# Patient Record
Sex: Female | Born: 1945
Health system: Southern US, Community
[De-identification: ages and names within clinical notes are randomized; demographics above are authoritative.]

## PROBLEM LIST (undated history)

## (undated) DIAGNOSIS — Z720 Tobacco use: Secondary | ICD-10-CM

## (undated) DIAGNOSIS — Z923 Personal history of irradiation: Secondary | ICD-10-CM

## (undated) DIAGNOSIS — J189 Pneumonia, unspecified organism: Secondary | ICD-10-CM

## (undated) DIAGNOSIS — R911 Solitary pulmonary nodule: Secondary | ICD-10-CM

## (undated) DIAGNOSIS — J449 Chronic obstructive pulmonary disease, unspecified: Secondary | ICD-10-CM

## (undated) DIAGNOSIS — E785 Hyperlipidemia, unspecified: Secondary | ICD-10-CM

## (undated) DIAGNOSIS — R0600 Dyspnea, unspecified: Secondary | ICD-10-CM

## (undated) HISTORY — PX: TONSILLECTOMY: SUR1361

## (undated) HISTORY — PX: WRIST SURGERY: SHX841

## (undated) HISTORY — DX: Solitary pulmonary nodule: R91.1

## (undated) HISTORY — DX: Personal history of irradiation: Z92.3

## (undated) HISTORY — PX: TUBAL LIGATION: SHX77

## (undated) HISTORY — PX: OTHER SURGICAL HISTORY: SHX169

## (undated) HISTORY — PX: WISDOM TOOTH EXTRACTION: SHX21

## (undated) HISTORY — DX: Hyperlipidemia, unspecified: E78.5

## (undated) HISTORY — DX: Chronic obstructive pulmonary disease, unspecified: J44.9

## (undated) HISTORY — DX: Tobacco use: Z72.0

---

## 2005-09-23 ENCOUNTER — Encounter: Payer: Self-pay | Admitting: Pulmonary Disease

## 2005-10-09 ENCOUNTER — Encounter: Payer: Self-pay | Admitting: Pulmonary Disease

## 2005-12-04 ENCOUNTER — Ambulatory Visit: Payer: Self-pay | Admitting: Internal Medicine

## 2005-12-29 ENCOUNTER — Encounter: Payer: Self-pay | Admitting: Pulmonary Disease

## 2006-03-08 ENCOUNTER — Ambulatory Visit: Payer: Self-pay | Admitting: Internal Medicine

## 2006-04-19 ENCOUNTER — Ambulatory Visit: Payer: Self-pay | Admitting: Internal Medicine

## 2006-07-27 LAB — CONVERTED CEMR LAB: Pap Smear: NORMAL

## 2006-10-25 ENCOUNTER — Encounter: Payer: Self-pay | Admitting: Pulmonary Disease

## 2006-11-29 ENCOUNTER — Encounter: Payer: Self-pay | Admitting: Pulmonary Disease

## 2007-04-07 ENCOUNTER — Encounter: Payer: Self-pay | Admitting: Pulmonary Disease

## 2007-08-02 ENCOUNTER — Ambulatory Visit: Payer: Self-pay | Admitting: Internal Medicine

## 2007-08-12 ENCOUNTER — Encounter: Payer: Self-pay | Admitting: Pulmonary Disease

## 2007-11-17 ENCOUNTER — Ambulatory Visit: Payer: Self-pay | Admitting: Internal Medicine

## 2007-11-17 DIAGNOSIS — J449 Chronic obstructive pulmonary disease, unspecified: Secondary | ICD-10-CM

## 2007-11-17 DIAGNOSIS — J4489 Other specified chronic obstructive pulmonary disease: Secondary | ICD-10-CM

## 2007-11-17 HISTORY — DX: Chronic obstructive pulmonary disease, unspecified: J44.9

## 2007-11-17 HISTORY — DX: Other specified chronic obstructive pulmonary disease: J44.89

## 2007-11-25 ENCOUNTER — Telehealth (INDEPENDENT_AMBULATORY_CARE_PROVIDER_SITE_OTHER): Payer: Self-pay | Admitting: *Deleted

## 2007-11-25 LAB — CONVERTED CEMR LAB
ALT: 20 units/L (ref 0–35)
AST: 22 units/L (ref 0–37)
Albumin: 4 g/dL (ref 3.5–5.2)
Alkaline Phosphatase: 79 units/L (ref 39–117)
BUN: 8 mg/dL (ref 6–23)
Basophils Absolute: 0 10*3/uL (ref 0.0–0.1)
Basophils Relative: 0 % (ref 0.0–1.0)
Bilirubin, Direct: 0.1 mg/dL (ref 0.0–0.3)
CO2: 31 meq/L (ref 19–32)
Calcium: 9.1 mg/dL (ref 8.4–10.5)
Chloride: 105 meq/L (ref 96–112)
Cholesterol: 191 mg/dL (ref 0–200)
Creatinine, Ser: 0.7 mg/dL (ref 0.4–1.2)
Eosinophils Absolute: 0.1 10*3/uL (ref 0.0–0.7)
Eosinophils Relative: 1.6 % (ref 0.0–5.0)
GFR calc Af Amer: 110 mL/min
GFR calc non Af Amer: 91 mL/min
Glucose, Bld: 79 mg/dL (ref 70–99)
HCT: 43.1 % (ref 36.0–46.0)
HDL: 52 mg/dL (ref >39.0)
Hemoglobin: 14.5 g/dL (ref 12.0–15.0)
LDL Cholesterol: 124 mg/dL — ABNORMAL HIGH (ref 0–99)
Lymphocytes Relative: 39.7 % (ref 12.0–46.0)
MCHC: 33.5 g/dL (ref 30.0–36.0)
MCV: 94 fL (ref 78.0–100.0)
Monocytes Absolute: 0.4 10*3/uL (ref 0.1–1.0)
Monocytes Relative: 7.2 % (ref 3.0–12.0)
Neutro Abs: 3.2 10*3/uL (ref 1.4–7.7)
Neutrophils Relative %: 51.5 % (ref 43.0–77.0)
Platelets: 239 10*3/uL (ref 150–400)
Potassium: 3.6 meq/L (ref 3.5–5.1)
RBC: 4.59 M/uL (ref 3.87–5.11)
RDW: 12.5 % (ref 11.5–14.6)
Sodium: 141 meq/L (ref 135–145)
TSH: 1.23 microintl units/mL (ref 0.35–5.50)
Total Bilirubin: 0.7 mg/dL (ref 0.3–1.2)
Total CHOL/HDL Ratio: 3.7
Total Protein: 7.1 g/dL (ref 6.0–8.3)
Triglycerides: 77 mg/dL (ref 0–149)
VLDL: 15 mg/dL (ref 0–40)
WBC: 6.1 10*3/uL (ref 4.5–10.5)

## 2007-12-08 ENCOUNTER — Encounter: Payer: Self-pay | Admitting: Pulmonary Disease

## 2007-12-08 ENCOUNTER — Encounter: Payer: Self-pay | Admitting: Internal Medicine

## 2008-01-16 LAB — CONVERTED CEMR LAB: Pap Smear: NORMAL

## 2008-01-16 LAB — HM COLONOSCOPY: HM Colonoscopy: 3

## 2008-03-22 ENCOUNTER — Encounter: Payer: Self-pay | Admitting: Internal Medicine

## 2008-04-16 ENCOUNTER — Encounter: Payer: Self-pay | Admitting: Pulmonary Disease

## 2008-05-15 ENCOUNTER — Telehealth: Payer: Self-pay | Admitting: Internal Medicine

## 2008-05-25 ENCOUNTER — Encounter: Payer: Self-pay | Admitting: Internal Medicine

## 2008-06-04 ENCOUNTER — Encounter: Payer: Self-pay | Admitting: Pulmonary Disease

## 2008-07-23 ENCOUNTER — Ambulatory Visit: Payer: Self-pay | Admitting: Internal Medicine

## 2008-08-07 ENCOUNTER — Telehealth (INDEPENDENT_AMBULATORY_CARE_PROVIDER_SITE_OTHER): Payer: Self-pay | Admitting: *Deleted

## 2008-08-13 ENCOUNTER — Telehealth (INDEPENDENT_AMBULATORY_CARE_PROVIDER_SITE_OTHER): Payer: Self-pay | Admitting: *Deleted

## 2008-08-15 ENCOUNTER — Ambulatory Visit: Payer: Self-pay | Admitting: Internal Medicine

## 2008-10-01 ENCOUNTER — Encounter: Payer: Self-pay | Admitting: Pulmonary Disease

## 2008-10-17 ENCOUNTER — Ambulatory Visit: Payer: Self-pay | Admitting: Internal Medicine

## 2008-11-14 ENCOUNTER — Encounter: Payer: Self-pay | Admitting: Internal Medicine

## 2008-11-14 LAB — HM DEXA SCAN

## 2008-11-19 ENCOUNTER — Ambulatory Visit: Payer: Self-pay | Admitting: Internal Medicine

## 2008-11-19 LAB — CONVERTED CEMR LAB
ALT: 18 units/L (ref 0–35)
AST: 18 units/L (ref 0–37)
Albumin: 3.8 g/dL (ref 3.5–5.2)
Alkaline Phosphatase: 91 units/L (ref 39–117)
BUN: 10 mg/dL (ref 6–23)
Basophils Absolute: 0 10*3/uL (ref 0.0–0.1)
Basophils Relative: 0.3 % (ref 0.0–3.0)
Bilirubin, Direct: 0.1 mg/dL (ref 0.0–0.3)
CO2: 30 meq/L (ref 19–32)
Calcium: 9 mg/dL (ref 8.4–10.5)
Chloride: 107 meq/L (ref 96–112)
Cholesterol: 247 mg/dL — ABNORMAL HIGH (ref 0–200)
Creatinine, Ser: 0.7 mg/dL (ref 0.4–1.2)
Direct LDL: 198.1 mg/dL
Eosinophils Absolute: 0.1 10*3/uL (ref 0.0–0.7)
Eosinophils Relative: 1.6 % (ref 0.0–5.0)
GFR calc non Af Amer: 90.6 mL/min (ref >60)
Glucose, Bld: 91 mg/dL (ref 70–99)
HCT: 45.4 % (ref 36.0–46.0)
HDL: 53.5 mg/dL (ref >39.00)
Hemoglobin: 15.6 g/dL — ABNORMAL HIGH (ref 12.0–15.0)
Lymphocytes Relative: 29.3 % (ref 12.0–46.0)
Lymphs Abs: 1.5 10*3/uL (ref 0.7–4.0)
MCHC: 34.3 g/dL (ref 30.0–36.0)
MCV: 92.8 fL (ref 78.0–100.0)
Monocytes Absolute: 0.4 10*3/uL (ref 0.1–1.0)
Monocytes Relative: 7.5 % (ref 3.0–12.0)
Neutro Abs: 3.1 10*3/uL (ref 1.4–7.7)
Neutrophils Relative %: 61.3 % (ref 43.0–77.0)
Platelets: 211 10*3/uL (ref 150.0–400.0)
Potassium: 3.9 meq/L (ref 3.5–5.1)
RBC: 4.89 M/uL (ref 3.87–5.11)
RDW: 12.2 % (ref 11.5–14.6)
Sodium: 141 meq/L (ref 135–145)
TSH: 1.25 microintl units/mL (ref 0.35–5.50)
Total Bilirubin: 0.6 mg/dL (ref 0.3–1.2)
Total CHOL/HDL Ratio: 5
Total Protein: 7.1 g/dL (ref 6.0–8.3)
Triglycerides: 115 mg/dL (ref 0.0–149.0)
VLDL: 23 mg/dL (ref 0.0–40.0)
WBC: 5.1 10*3/uL (ref 4.5–10.5)

## 2008-11-20 ENCOUNTER — Encounter: Payer: Self-pay | Admitting: Pulmonary Disease

## 2008-11-20 ENCOUNTER — Encounter: Payer: Self-pay | Admitting: Internal Medicine

## 2008-11-20 LAB — CONVERTED CEMR LAB: Vit D, 1,25-Dihydroxy: 26 — ABNORMAL LOW (ref 30–89)

## 2008-11-21 ENCOUNTER — Telehealth: Payer: Self-pay | Admitting: Internal Medicine

## 2009-01-22 LAB — CONVERTED CEMR LAB: Pap Smear: NORMAL

## 2009-02-21 ENCOUNTER — Ambulatory Visit: Payer: Self-pay | Admitting: Internal Medicine

## 2009-03-28 ENCOUNTER — Telehealth: Payer: Self-pay | Admitting: Internal Medicine

## 2009-05-14 ENCOUNTER — Telehealth: Payer: Self-pay | Admitting: Internal Medicine

## 2009-05-15 ENCOUNTER — Telehealth: Payer: Self-pay | Admitting: Internal Medicine

## 2009-06-05 ENCOUNTER — Ambulatory Visit: Payer: Self-pay | Admitting: Internal Medicine

## 2009-06-05 DIAGNOSIS — R222 Localized swelling, mass and lump, trunk: Secondary | ICD-10-CM

## 2009-07-23 ENCOUNTER — Telehealth: Payer: Self-pay | Admitting: Internal Medicine

## 2009-10-07 ENCOUNTER — Telehealth: Payer: Self-pay | Admitting: Internal Medicine

## 2009-10-08 ENCOUNTER — Ambulatory Visit (HOSPITAL_BASED_OUTPATIENT_CLINIC_OR_DEPARTMENT_OTHER): Admission: RE | Admit: 2009-10-08 | Discharge: 2009-10-08 | Payer: Self-pay | Admitting: Internal Medicine

## 2009-10-08 ENCOUNTER — Ambulatory Visit: Payer: Self-pay | Admitting: Diagnostic Radiology

## 2009-10-08 ENCOUNTER — Ambulatory Visit: Payer: Self-pay | Admitting: Internal Medicine

## 2009-10-15 ENCOUNTER — Telehealth: Payer: Self-pay | Admitting: Internal Medicine

## 2009-10-17 ENCOUNTER — Ambulatory Visit (HOSPITAL_BASED_OUTPATIENT_CLINIC_OR_DEPARTMENT_OTHER): Admission: RE | Admit: 2009-10-17 | Discharge: 2009-10-17 | Payer: Self-pay | Admitting: Internal Medicine

## 2009-10-17 ENCOUNTER — Ambulatory Visit: Payer: Self-pay | Admitting: Diagnostic Radiology

## 2009-10-17 ENCOUNTER — Telehealth: Payer: Self-pay | Admitting: Internal Medicine

## 2009-10-28 ENCOUNTER — Telehealth: Payer: Self-pay | Admitting: Internal Medicine

## 2009-10-29 ENCOUNTER — Ambulatory Visit (HOSPITAL_COMMUNITY): Admission: RE | Admit: 2009-10-29 | Discharge: 2009-10-29 | Payer: Self-pay | Admitting: Internal Medicine

## 2009-10-31 ENCOUNTER — Telehealth: Payer: Self-pay | Admitting: Internal Medicine

## 2009-11-05 ENCOUNTER — Telehealth: Payer: Self-pay | Admitting: Internal Medicine

## 2009-11-05 ENCOUNTER — Encounter: Payer: Self-pay | Admitting: Internal Medicine

## 2009-11-05 LAB — CONVERTED CEMR LAB: BUN: 10 mg/dL (ref 6–23)

## 2009-11-07 ENCOUNTER — Telehealth: Payer: Self-pay | Admitting: Internal Medicine

## 2009-11-07 ENCOUNTER — Encounter: Admission: RE | Admit: 2009-11-07 | Discharge: 2009-11-07 | Payer: Self-pay | Admitting: Internal Medicine

## 2009-11-18 ENCOUNTER — Telehealth: Payer: Self-pay | Admitting: Internal Medicine

## 2009-11-19 ENCOUNTER — Encounter: Payer: Self-pay | Admitting: Internal Medicine

## 2009-12-03 ENCOUNTER — Telehealth: Payer: Self-pay | Admitting: Internal Medicine

## 2010-01-13 ENCOUNTER — Ambulatory Visit: Payer: Self-pay | Admitting: Diagnostic Radiology

## 2010-01-13 ENCOUNTER — Ambulatory Visit (HOSPITAL_BASED_OUTPATIENT_CLINIC_OR_DEPARTMENT_OTHER): Admission: RE | Admit: 2010-01-13 | Discharge: 2010-01-13 | Payer: Self-pay | Admitting: Obstetrics and Gynecology

## 2010-01-14 ENCOUNTER — Ambulatory Visit: Payer: Self-pay | Admitting: Pulmonary Disease

## 2010-03-03 ENCOUNTER — Ambulatory Visit: Payer: Self-pay | Admitting: Radiology

## 2010-03-03 ENCOUNTER — Ambulatory Visit (HOSPITAL_BASED_OUTPATIENT_CLINIC_OR_DEPARTMENT_OTHER)
Admission: RE | Admit: 2010-03-03 | Discharge: 2010-03-03 | Payer: Self-pay | Source: Home / Self Care | Admitting: Orthopedic Surgery

## 2010-05-09 ENCOUNTER — Ambulatory Visit: Payer: Self-pay | Admitting: Internal Medicine

## 2010-05-28 ENCOUNTER — Telehealth: Payer: Self-pay | Admitting: Internal Medicine

## 2010-06-11 ENCOUNTER — Ambulatory Visit: Payer: Self-pay | Admitting: Internal Medicine

## 2010-07-01 ENCOUNTER — Telehealth: Payer: Self-pay | Admitting: Internal Medicine

## 2010-07-03 ENCOUNTER — Ambulatory Visit: Payer: Self-pay | Admitting: Internal Medicine

## 2010-07-04 ENCOUNTER — Ambulatory Visit: Payer: Self-pay | Admitting: Family

## 2010-07-04 DIAGNOSIS — J329 Chronic sinusitis, unspecified: Secondary | ICD-10-CM | POA: Insufficient documentation

## 2010-08-19 ENCOUNTER — Telehealth: Payer: Self-pay | Admitting: Family

## 2010-08-19 ENCOUNTER — Ambulatory Visit
Admission: RE | Admit: 2010-08-19 | Discharge: 2010-08-19 | Payer: Self-pay | Source: Home / Self Care | Attending: Family | Admitting: Family

## 2010-08-19 ENCOUNTER — Ambulatory Visit (HOSPITAL_BASED_OUTPATIENT_CLINIC_OR_DEPARTMENT_OTHER)
Admission: RE | Admit: 2010-08-19 | Discharge: 2010-08-19 | Payer: Self-pay | Source: Home / Self Care | Attending: Internal Medicine | Admitting: Internal Medicine

## 2010-08-26 NOTE — Progress Notes (Signed)
Summary: 4.27.11 visit  Phone Note Call from Patient   Caller: Patient Call For: D. Thomos Lemons DO Complaint: Breathing Problems Summary of Call: Pt wants to know if you definitely need to see her for her follow up visit on Wed 11/20/09, she would like to cancel if the appt is not necessary, pls advise Initial call taken by: Lannette Donath,  November 18, 2009 9:27 AM  Follow-up for Phone Call        ok to cancel appt Follow-up by: D. Thomos Lemons DO,  November 18, 2009 1:37 PM  Additional Follow-up for Phone Call Additional follow up Details #1::        LMOM ok to cancel appt Diane Tomerlin  November 18, 2009 2:17 PM

## 2010-08-26 NOTE — Progress Notes (Signed)
Summary: Status Update  Phone Note Call from Patient Call back at Home Phone [phone]   Caller: Patient Call For: D. Thomos Lemons DO Summary of Call: patient called and left voice message stating she still has nasal drip, cough, and sore throat. She would like to know if Dr Artist Pais would prescribe a stronger antibiotic Initial call taken by: Glendell Docker CMA,  July 01, 2010 10:16 AM  Follow-up for Phone Call        we can not rx abx w/o examining pt  Follow-up by: D. Thomos Lemons DO,  July 01, 2010 1:23 PM  Additional Follow-up for Phone Call Additional follow up Details #1::        call returned to patient at [phone], she has been advised per Dr Artist Pais instructions. Appt scheduled for Thursday 12/08/ 2011 @ 11am  with Dr Artist Pais. Patient states she will call back if and cancel appointment if she is feeling better Additional Follow-up by: Glendell Docker CMA,  July 01, 2010 1:58 PM

## 2010-08-26 NOTE — Progress Notes (Signed)
Summary: Advair Refill  Phone Note Call from Patient Call back at Home Phone [phone]   Caller: Patient Call For: D. Thomos Lemons DO Reason for Call: Talk to Nurse Summary of Call: Pt is about to run out of Adver sample wants to know if she should get a refill Initial call taken by: Lannette Donath,  October 28, 2009 10:53 AM    Prescriptions: ADVAIR DISKUS 250-50 MCG/DOSE AEPB (FLUTICASONE-SALMETEROL) 1 dose two times a day  #1 x 0   Entered by:   Glendell Docker CMA   Authorized by:   D. Thomos Lemons DO   Signed by:   Glendell Docker CMA on 10/28/2009   Method used:   Electronically to        [facility] # [phone]* (retail)       [address]       [place], [place]  [address]       Ph: [phone]       Fax: [phone]   RxID:   [id number]

## 2010-08-26 NOTE — Progress Notes (Signed)
Summary: COUGH MEDICINE   Phone Note Call from Patient Call back at Work Phone [phone]   Caller: PT LIVE Call For: [facility]  Summary of Call: Karsten Ro MAIN [phone] WOULD LIKE RX FOR COUGH MEDICINE  Initial call taken by: Roselle Locus,  October 07, 2009 9:55 AM  Follow-up for Phone Call        call returned to patient at [phone] no answer, voice message left informing patient she will need to schedule office visit for assessment of her cough. Rx for cough was denied, office visit is needed Follow-up by: Glendell Docker CMA,  October 07, 2009 2:19 PM

## 2010-08-26 NOTE — Progress Notes (Signed)
Summary: Records rec from Dr Eulis Foster [facility]  Phone Note From Other Clinic   Caller: [facility] Call For: Artist Pais  Summary of Call: records received from Dr Eulis Foster at [facility] Initial call taken by: Roselle Locus,  Dec 03, 2009 8:27 AM

## 2010-08-26 NOTE — Progress Notes (Signed)
Summary: Cough medicine  Phone Note Call from Patient Call back at Home Phone [phone]   Caller: Patient Call For: D. Thomos Lemons DO Summary of Call: patient called and left voice message request a refill on cough syrup. She states she has developed a cough and would like her rx refilled. Initial call taken by: Glendell Docker CMA,  May 28, 2010 11:01 AM  Follow-up for Phone Call        call was returned to patient at [phone], patient states her symptoms started Friday with nasal congestion, dry cough, that is worse when laying down, she denies fever.  She states she has been taking Tylenol sinus with no improvement Follow-up by: Glendell Docker CMA,  May 28, 2010 11:04 AM  Additional Follow-up for Phone Call Additional follow up Details #1::        try otc mucinex.   OV if cough gets worse Additional Follow-up by: D. Thomos Lemons DO,  May 28, 2010 12:01 PM    Additional Follow-up for Phone Call Additional follow up Details #2::    call returned to patient at [phone], she has been advised per Dr Artist Pais instructions Follow-up by: Glendell Docker CMA,  May 28, 2010 1:34 PM

## 2010-08-26 NOTE — Progress Notes (Signed)
  Phone Note Call from Patient   Caller: Patient Details for Reason: Referral to Pulmonary Summary of Call: Pt would like to start see Pulmonary in [facility],  she had her records from  her Pulmonalist sent here  . If this is ok please send referral   Initial call taken by: Darral Dash,  Dec 03, 2009 12:05 PM  Follow-up for Phone Call        Referral order rec'd  appt  Dr Vassie Loll June 7th   pt aware Follow-up by: Darral Dash,  Dec 05, 2009 8:52 AM

## 2010-08-26 NOTE — Miscellaneous (Signed)
Summary: TB/[facility]  [facility]   Imported By: Sherian Rein 02/04/2010 09:34:21  _____________________________________________________________________  External Attachment:    Type:   Image     Comment:   External Document

## 2010-08-26 NOTE — Progress Notes (Signed)
Summary: whole body scan results   Phone Note Call from Patient Call back at Home Phone [phone]   Caller: patient  Call For: yoo  Summary of Call: would like reults of whole body scan  please call before 2  Initial call taken by: Roselle Locus,  October 31, 2009 9:07 AM  Follow-up for Phone Call        Spoke to pt and advised her that Dr. Artist Pais will need to review and interpret her results. He is out of the office until Monday but we will call her back then.  Pt. requests that we call her before 2pm as she will be at work then.  Mervin Kung CMA  October 31, 2009 11:19 AM   Additional Follow-up for Phone Call Additional follow up Details #1::        bone scan shows increased uptake in shoulder.   radiologist is recommending MRI of shoulder Additional Follow-up by: D. Thomos Lemons DO,  November 04, 2009 2:21 PM    Additional Follow-up for Phone Call Additional follow up Details #2::     she has been advised per Dr Artist Pais instructions, she states that she has already been informed of her results .patient states she is scared to death of a closed MRI,  and would like to know if there is a way she could have an "open", she is requesting the phone number to the radiology facility to check to see if it is covered and will need to know the cost prior to having the test done. She wouldl like to receive a call back today prior to 2pm. Patient was informed that I would relay the information, however I could not guarantee that she would receive return phone call before 2 pm Follow-up by: Glendell Docker CMA,  November 05, 2009 9:56 AM  Additional Follow-up for Phone Call Additional follow up Details #3:: Details for Additional Follow-up Action Taken: patient was advised rx faxed to pharmacy Additional Follow-up by: Glendell Docker CMA,  November 05, 2009 4:11 PM  New/Updated Medications: ALPRAZOLAM 0.25 MG TABS (ALPRAZOLAM) take 1 tab 30 mins before MRI scan Prescriptions: ALPRAZOLAM 0.25 MG TABS  (ALPRAZOLAM) take 1 tab 30 mins before MRI scan  #5 x 0   Entered and Authorized by:   D. Thomos Lemons DO   Signed by:   D. Thomos Lemons DO on 11/05/2009   Method used:   Print then Give to Patient   RxID:   [id number]

## 2010-08-26 NOTE — Progress Notes (Signed)
  Phone Note Outgoing Call   Summary of Call: call pt - xray of shoulder confirms bony abnormality.   bone scan is suggested to further evaluate Initial call taken by: D. Thomos Lemons DO,  October 17, 2009 1:08 PM  Follow-up for Phone Call        Pt wants more information  about x ray   and before she will schedule bone scan    phone #  [phone]    Follow-up by: Darral Dash,  October 17, 2009 3:25 PM  Additional Follow-up for Phone Call Additional follow up Details #1::        reviewed x ray results with pt.  she is having financial issues and she thinks scan may not be covered.   I explained why bone scan was suggested by radiologist.  she expresses understanding and will proceed with scheduling exam Additional Follow-up by: D. Thomos Lemons DO,  October 17, 2009 5:34 PM    Additional Follow-up for Phone Call Additional follow up Details #2::    Patient scheduled to bone scan  April 5  @  [facility]    spoke with patient appt confirmed   Follow-up by: Darral Dash,  October 18, 2009 8:10 AM

## 2010-08-26 NOTE — Assessment & Plan Note (Signed)
Summary: cough- Jr   Vital Signs:  Patient profile:   65 year old female Height:      65 inches Weight:      112 pounds BMI:     18.71 O2 Sat:      97 % on Room air Temp:     97.4 degrees F oral Pulse rate:   78 / minute Pulse rhythm:   regular Resp:     18 per minute BP sitting:   90 / 60  (left arm) Cuff size:   regular  Vitals Entered By: Glendell Docker CMA (October 08, 2009 11:01 AM)  O2 Flow:  Room air CC: Rm 3- Cough   Primary Care Provider:  Dondra Spry DO  CC:  Rm 3- Cough.  History of Present Illness: 65 y/o female c/o productive cough  cough started - 1 week ago. nose is stuffy.  sputum - yellowish  no fever or shortness of breath  she is followed by pulm in [place] but she does not want to go back. she feels guilty about inability to quit smoking  Preventive Screening-Counseling & Management  Alcohol-Tobacco     Smoking Status: current  Allergies (verified): No Known Drug Allergies  Past History:  Past Medical History: COPD - Dr.  Gerome Apley ( [place] pulmonologist) Tobacco use     Past Surgical History: Tubal ligation      Family History: colon Ca - F breast Ca - no CAD - no lymphoma - brother HTN - siste (twin) DM - sister (twin) stroke - no     Social History: moved from [place] 4-07 Married  2 kids husband out of work x 6-7 months   Current Smoker - approx 1 ppd  Physical Exam  General:  alert, well-developed, and well-nourished.   Lungs:  decreases breath sounds right mid lung feel.   normal respiratory effort and no wheezes.   Heart:  normal rate, regular rhythm, and no gallop.   Extremities:  No lower extremity edema Neurologic:  cranial nerves II-XII intact and gait normal.   Psych:  normally interactive and good eye contact.     Impression & Recommendations:  Problem # 1:  COPD (ICD-496) Pt with COPD exac.  productive cough and mild sob.  tx with ceftin and prednisone taper. she has ongoing tob abuse.  She is followed by Dr.  Gerome Apley in [place] but does not want to go back for f/u she tried symbicort but stopped because it triggered cough  she has tried to quit smoking but unable.   obtain copy of prev PFTs. we discussed using patches.  Orders: T-2 View CXR, Same Day (71020.5TC)  Her updated medication list for this problem includes:    Advair Diskus 250-50 Mcg/dose Aepb (Fluticasone-salmeterol) .Marland Kitchen... 1 dose two times a day  Complete Medication List: 1)  Alendronate Sodium 70 Mg Tabs (Alendronate sodium) .... One by mouth q weekly 2)  Vitamin D 1000 Unit Tabs (Cholecalciferol) .... Take 1 tablet by mouth once a day 3)  Citracal/vitamin D 250-200 Mg-unit Tabs (Calcium citrate-vitamin d) .... 2 tablets by mouth once daily 4)  Cefuroxime Axetil 500 Mg Tabs (Cefuroxime axetil) .... One by mouth two times a day 5)  Advair Diskus 250-50 Mcg/dose Aepb (Fluticasone-salmeterol) .Marland Kitchen.. 1 dose two times a day 6)  Prednisone 10 Mg Tabs (Prednisone) .... 3 tabs by mouth once daily x 3 days, 2 tabs by mouth once daily x 3 days, 1 tab by mouth once daily x 3  days 7)  Hydrocodone-homatropine 5-1.5 Mg/1ml Syrp (Hydrocodone-homatropine) .... 5 ml by mouth qhs  Patient Instructions: 1)  Please schedule a follow-up appointment in 1 month. Prescriptions: PREDNISONE 10 MG TABS (PREDNISONE) 3 tabs by mouth once daily x 3 days, 2 tabs by mouth once daily x 3 days, 1 tab by mouth once daily x 3 days  #18 x 0   Entered and Authorized by:   D. Thomos Lemons DO   Signed by:   D. Thomos Lemons DO on 10/08/2009   Method used:   Print then Give to Patient   RxID:   [id number] CEFUROXIME AXETIL 500 MG TABS (CEFUROXIME AXETIL) one by mouth two times a day  #20 x 0   Entered and Authorized by:   D. Thomos Lemons DO   Signed by:   D. Thomos Lemons DO on 10/08/2009   Method used:   Print then Give to Patient   RxID:   [id number] HYDROCODONE-HOMATROPINE 5-1.5 MG/5ML SYRP (HYDROCODONE-HOMATROPINE) 5 ml by mouth qhs  #60 ml x 0   Entered and  Authorized by:   D. Thomos Lemons DO   Signed by:   D. Thomos Lemons DO on 10/08/2009   Method used:   Print then Give to Patient   RxID:   [id number] PREDNISONE 10 MG TABS (PREDNISONE) 3 tabs by mouth once daily x 3 days, 2 tabs by mouth once daily x 3 days, 1 tab by mouth once daily x 3 days  #18 x 0   Entered and Authorized by:   D. Thomos Lemons DO   Signed by:   D. Thomos Lemons DO on 10/08/2009   Method used:   Electronically to        [facility] # [phone]* (retail)       [address]       [place], [place]  [address]       Ph: [phone]       Fax: [phone]   RxID:   [id number] CEFUROXIME AXETIL 500 MG TABS (CEFUROXIME AXETIL) one by mouth two times a day  #20 x 0   Entered and Authorized by:   D. Thomos Lemons DO   Signed by:   D. Thomos Lemons DO on 10/08/2009   Method used:   Electronically to        [facility] # [phone]* (retail)       [address]       [place], [place]  [address]       Ph: [phone]       Fax: [phone]   RxID:   [id number]   Current Allergies (reviewed today): No known allergies

## 2010-08-26 NOTE — Assessment & Plan Note (Signed)
Summary: copd/apc   Visit Type:  Initial Consult Primary Provider/Referring Provider:  Dondra Spry DO  CC:  Pt here for pulmonary consult. Pt c/o.  History of Present Illness: 63/F , smoker for management of COPD. She has smoked about 45 Pyrs , about a PPD .She was able to quit x 10 wks in 2007 using patches. Wellbutrin has not helped & she is afraid of mental effects of chantix. Review of lung fucntion shows a gradual decline with FEV1 1.8 (75%) in feb 2007  to 1.4 (51% ) in march '10. She reports dyspnea on walking up stairs but can walk a good distance on level ground. she reports 1-2 episodes of bronchitis in a year, each lasting 3-4 wks & requiring oral steroids, last in march 2011. She has received pneumovax.   Preventive Screening-Counseling & Management  Alcohol-Tobacco     Smoking Status: current     Packs/Day: 1.0     Year Started: 1965  Current Medications (verified): 1)  Alendronate Sodium 70 Mg Tabs (Alendronate Sodium) .... One By Mouth Q Weekly 2)  Vitamin D 1000 Unit Tabs (Cholecalciferol) .... Take 1 Tablet By Mouth Once A Day 3)  Citracal/vitamin D 250-200 Mg-Unit Tabs (Calcium Citrate-Vitamin D) .... 2 Tablets By Mouth Once Daily  Allergies (verified): 1)  ! Symbicort (Budesonide-Formoterol Fumarate)  Past History:  Past Medical History: Last updated: 10/08/2009 COPD - Dr.  Gerome Apley ( [place] pulmonologist) Tobacco use     Past Surgical History: Last updated: 10/08/2009 Tubal ligation      Family History: Last updated: 10/08/2009 colon Ca - F breast Ca - no CAD - no lymphoma - brother HTN - siste (twin) DM - sister (twin) stroke - no     Social History: Last updated: 10/08/2009 moved from [place] 4-07 Married  2 kids husband out of work x 6-7 months   Current Smoker - approx 1 ppd  Risk Factors: Smoking Status: current (01/14/2010) Packs/Day: 1.0 (01/14/2010)  Review of Systems       The patient complains of shortness of breath with  activity.  The patient denies shortness of breath at rest, productive cough, non-productive cough, coughing up blood, chest pain, irregular heartbeats, acid heartburn, indigestion, loss of appetite, weight change, abdominal pain, difficulty swallowing, sore throat, tooth/dental problems, headaches, nasal congestion/difficulty breathing through nose, sneezing, itching, ear ache, anxiety, depression, hand/feet swelling, joint stiffness or pain, rash, change in color of mucus, and fever.    Vital Signs:  Patient profile:   65 year old female Height:      65 inches Weight:      111 pounds BMI:     18.54 O2 Sat:      96 % on Room air Temp:     98.2 degrees F oral Pulse rate:   66 / minute BP sitting:   90 / 62  (left arm) Cuff size:   regular  Vitals Entered By: Zackery Barefoot CMA (January 14, 2010 8:49 AM)  O2 Flow:  Room air CC: Pt here for pulmonary consult. Pt c/o Comments Medications reviewed with patient Verified contact number and pharmacy with patient Zackery Barefoot CMA  January 14, 2010 8:49 AM    Physical Exam  Additional Exam:  Gen. Pleasant, well-nourished, in no distress, normal affect ENT - no lesions, no post nasal drip Neck: No JVD, no thyromegaly, no carotid bruits Lungs: no use of accessory muscles, no dullness to percussion, decreased BL  without rales or rhonchi  Cardiovascular: Rhythm regular,  heart sounds  normal, no murmurs or gallops, no peripheral edema Abdomen: soft and non-tender, no hepatosplenomegaly, BS normal. Musculoskeletal: No deformities, no cyanosis or clubbing Neuro:  alert, non focal     Impression & Recommendations:  Problem # 1:  COPD (ICD-496) - moderate with clear decline in lung function related to smoking. Try to hold off bronchodilators here Use albuterol mDI as needed   Problem # 2:  TOBACCO USER (ICD-305.1)  Need to focus our effort shere. She does not want to try wellbutrin again or chantix. Will try nicotine supplements & make  another quit attempt. Her updated medication list for this problem includes:    Nicotrol 10 Mg Inha (Nicotine) .Marland Kitchen..Marland Kitchen Two times a day  Orders: Consultation Level III ([address]) Prescription Created Electronically [phone])  Medications Added to Medication List This Visit: 1)  Nicotrol 10 Mg Inha (Nicotine) .... Two times a day  Patient Instructions: 1)  Copy sent to: Dr Artist Pais 2)  Please schedule a follow-up appointment in 6 months with TP. 3)  Nicotine inhaler or gum for breakthrough on 21mg / day patch Prescriptions: NICOTROL 10 MG INHA (NICOTINE) two times a day  #20 x 1   Entered and Authorized by:   Comer Locket Vassie Loll MD   Signed by:   Comer Locket Vassie Loll MD on 01/14/2010   Method used:   Electronically to        [facility] # [phone]* (retail)       [address]       [place], [place]  [address]       Ph: [phone]       Fax: [phone]   RxID:   [id number]   Appended Document: copd/apc reviewed records from [facility] >>PPD neg, last CT chest 9/08 showed reduced RUl pleural nodule, PET negative in 2/07

## 2010-08-26 NOTE — Assessment & Plan Note (Signed)
Summary: Sx not any better, cough and nasal drip/hea--Rm 5   Vital Signs:  Patient profile:   65 year old female Height:      65 inches Weight:      110.50 pounds BMI:     18.45 O2 Sat:      98 % on Room air Temp:     97.8 degrees F oral Pulse rate:   78 / minute Pulse rhythm:   regular Resp:     16 per minute BP sitting:   110 / 80  (right arm) Cuff size:   regular  Vitals Entered By: Mervin Kung CMA Duncan Dull) (July 04, 2010 10:58 AM)  O2 Flow:  Room air CC: Pt states she still has post nasal drip and night time cough. Still has head congestion. Has completed antibiotic. Is Patient Diabetic? No Pain Assessment Patient in pain? no      Comments Pt has completed Cefuroxime and Hydrocodone. [name] CMA Duncan Dull)  July 04, 2010 11:04 AM    Primary Care Provider:  Dondra Spry DO  CC:  Pt states she still has post nasal drip and night time cough. Still has head congestion. Has completed antibiotic.Marland Kitchen  History of Present Illness: Denies sinus pressure.  + post nasal drip- clear nasal discharge.  Denies fever.  Antiobiotics did not help her symptoms.  Occasional cough during the day, worse at night.  When she lays flat.  Has not tried any otch meds- ran out of the hydrocodone.  Has been using honey at night, cough drops.  Denies symptoms of heart burn.  Reports that her symptoms started around Halloween.     Allergies: 1)  ! Symbicort (Budesonide-Formoterol Fumarate)  Past History:  Past Medical History: Last updated: 06/11/2010 COPD  Osteoporosis Dyslipidemia Tobacco use      Past Surgical History: Last updated: 10/08/2009 Tubal ligation      Family History: Reviewed history from 06/11/2010 and no changes required. colon Ca - F breast Ca - no CAD - no lymphoma - brother HTN - sister (twin) DM - sister (twin) stroke - no       Social History: Reviewed history from 06/11/2010 and no changes required. 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   Ears:  External ear exam shows no significant lesions or deformities.  Otoscopic examination reveals clear canals, tympanic membranes are intact bilaterally without bulging, retraction, inflammation or discharge. Hearing is grossly normal bilaterally. Mouth:  Oral mucosa and oropharynx without lesions or exudates.  Teeth in good repair. Neck:  No deformities, masses, or tenderness noted. Lungs:  Slightly diminished breath sounds throughout without wheezes, rales, rhonchi.  Breathing is even and non-labored. Heart:  Normal rate and regular rhythm. S1 and S2 normal without gallop, murmur, click, rub or other extra sounds.   Impression & Recommendations:  Problem # 1:  SINUSITIS (ICD-473.9) Assessment Deteriorated Will plan to add nasonex twice daily.  Will also plan to treat with a 10 day treatment of Doxycycline.  Cost is an issue for her and she is requesting somthing that is affordable.  Pt was counseled to call for f/u as noted in pt instructions.   nasonex lot #  [id number] 7 2013 (samples provided)  The following medications were removed from  the medication list:    Cefuroxime Axetil 500 Mg Tabs (Cefuroxime axetil) ..... One by mouth two times a day Her updated medication list for this problem includes:    Hydrocodone-homatropine 5-1.5 Mg/10ml Syrp (Hydrocodone-homatropine) .Marland KitchenMarland KitchenMarland KitchenMarland Kitchen 5 ml by mouth two times a day as needed for cough    Doxycycline Hyclate 100 Mg Caps (Doxycycline hyclate) ..... One cap by mouth two times a day for 10 days    Nasonex 50 Mcg/act Susp (Mometasone furoate) .Marland Kitchen..Marland Kitchen Two sprays each nostril once daily  Complete Medication List: 1)  Alendronate Sodium 70 Mg Tabs (Alendronate sodium) .... One by mouth q weekly 2)  Vitamin D 1000 Unit Tabs (Cholecalciferol) .... Take 1 tablet by  mouth once a day 3)  Citracal/vitamin D 250-200 Mg-unit Tabs (Calcium citrate-vitamin d) .... 2 tablets by mouth once daily 4)  Hydrocodone-homatropine 5-1.5 Mg/71ml Syrp (Hydrocodone-homatropine) .... 5 ml by mouth two times a day as needed for cough 5)  Doxycycline Hyclate 100 Mg Caps (Doxycycline hyclate) .... One cap by mouth two times a day for 10 days 6)  Nasonex 50 Mcg/act Susp (Mometasone furoate) .... Two sprays each nostril once daily  Patient Instructions: 1)  Call if you develop fever over 101, increasing sinus pressure, pain with eye movement, increased facial tenderness of swelling, or if you develop visual changes. 2)  Call if symptoms are not improved in 3 days. Prescriptions: NASONEX 50 MCG/ACT SUSP (MOMETASONE FUROATE) two sprays each nostril once daily  #2 x 0   Entered and Authorized by:   Lemont Fillers FNP   Signed by:   Lemont Fillers FNP on 07/04/2010   Method used:   Samples Given   RxID:   [id number] HYDROCODONE-HOMATROPINE 5-1.5 MG/5ML SYRP (HYDROCODONE-HOMATROPINE) 5 ml by mouth two times a day as needed for cough  #60 ml x 0   Entered and Authorized by:   Lemont Fillers FNP   Signed by:   Lemont Fillers FNP on 07/04/2010   Method used:   Print then Give to Patient   RxID:   [id number] DOXYCYCLINE HYCLATE 100 MG CAPS (DOXYCYCLINE HYCLATE) one cap by mouth two times a day for 10 days  #20 x 0   Entered and Authorized by:   Lemont Fillers FNP   Signed by:   Lemont Fillers FNP on 07/04/2010   Method used:   Electronically to        [facility] # [phone]* (retail)       [address]       [place], [place]  [address]       Ph: [phone]       Fax: [phone]   RxID:   [id number]    Orders Added: 1)  Est. Patient Level III [[address]]    Current Allergies (reviewed today): ! SYMBICORT (BUDESONIDE-FORMOTEROL FUMARATE)

## 2010-08-26 NOTE — Progress Notes (Signed)
Summary: Test Results  Phone Note Other Incoming   Summary of Call: call from radiologist - MRI of shoulder with worrisome features.  he suggest referral to orthopedic oncology I discussed findings with pt arrange referral to [facility] she will call back if she wants to go to [facility] instead  pt not having any left shoulder symptoms Initial call taken by: D. Thomos Lemons DO,  November 07, 2009 11:09 AM  Follow-up for Phone Call        call placed to patient at [phone], no answer, voice message left for patient to return call regarding test results Follow-up by: Glendell Docker CMA,  November 07, 2009 12:36 PM  Additional Follow-up for Phone Call Additional follow up Details #1::        Appt  with [facility]    Dr Josefine Class  on April 26   spoke with pt appt confirmed   . pt to pick up disc  at imaging    to take with her  Additional Follow-up by: Darral Dash,  November 08, 2009 11:39 AM

## 2010-08-26 NOTE — Assessment & Plan Note (Signed)
Summary: cough congestion/mhf   Vital Signs:  Patient profile:   65 year old female Height:      65 inches Weight:      112.75 pounds BMI:     18.83 O2 Sat:      98 % on Room air Temp:     97.7 degrees F oral Pulse rate:   71 / minute Resp:     18 per minute BP sitting:   94 / 60  (right arm) Cuff size:   regular  Vitals Entered By: Glendell Docker CMA (June 11, 2010 10:03 AM)  O2 Flow:  Room air CC: Cough Is Patient Diabetic? No Pain Assessment Patient in pain? no      Comments onset 3 weeks ago, dry cough, tatken otc meds no improvement, cough is worse at night, denies chest pain or discomfort, denies temperature   Primary Care Provider:  Dondra Spry DO  CC:  Cough.  History of Present Illness: 3 weeks of cough prev productive - white sputum now dry cough no relief with OTC cough meds  upper resp congestion sore throat   Preventive Screening-Counseling & Management  Alcohol-Tobacco     Smoking Status: current  Allergies: 1)  ! 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   Lungs:  normal respiratory effort and normal breath sounds.   Heart:  normal rate, regular rhythm, and no gallop.     Impression & Recommendations:  Problem # 1:  COPD (ICD-496) pt with mild exacerbation tx with ceftin Patient advised to call office if symptoms persist or worsen.  Complete Medication List: 1)  Alendronate Sodium 70 Mg Tabs (Alendronate sodium) .... One by mouth q weekly 2)  Vitamin D 1000  Unit Tabs (Cholecalciferol) .... Take 1 tablet by mouth once a day 3)  Citracal/vitamin D 250-200 Mg-unit Tabs (Calcium citrate-vitamin d) .... 2 tablets by mouth once daily 4)  Cefuroxime Axetil 500 Mg Tabs (Cefuroxime axetil) .... One by mouth two times a day 5)  Hydrocodone-homatropine 5-1.5 Mg/70ml Syrp (Hydrocodone-homatropine) .... 5 ml by mouth two times a day as needed for cough  Patient Instructions: 1)  Gargle with warm salt water 2)  Call our office if your symptoms do not  improve or gets worse. Prescriptions: HYDROCODONE-HOMATROPINE 5-1.5 MG/5ML SYRP (HYDROCODONE-HOMATROPINE) 5 ml by mouth two times a day as needed for cough  #60 ml x 0   Entered and Authorized by:   D. Thomos Lemons DO   Signed by:   D. Thomos Lemons DO on 06/11/2010   Method used:   Print then Give to Patient   RxID:   [id number] CEFUROXIME AXETIL 500 MG TABS (CEFUROXIME AXETIL) one by mouth two times a day  #14 x 0   Entered and Authorized by:   D. Thomos Lemons DO   Signed by:   D. Thomos Lemons DO on 06/11/2010   Method used:   Electronically to  [facility]  [address] # [phone]* (retail)       [phone]       [place], [place]  [address]       Ph: [phone]       Fax: [phone]   RxID:   [id number]    Orders Added: 1)  Est. Patient Level III [[address]]   Immunization History:  Influenza Immunization History:    Influenza:  historical (05/09/2010)   Immunization History:  Influenza Immunization History:    Influenza:  Historical (05/09/2010)  Current Allergies (reviewed today): ! SYMBICORT (BUDESONIDE-FORMOTEROL FUMARATE)

## 2010-08-26 NOTE — Progress Notes (Signed)
  Phone Note Call from Patient   Caller: Patient Details for Reason: Medication Summary of Call: Pt given rx for Advair, states medication cost is over $200 , and she cannot afford , wants to know if you can call in generic or something else. Initial call taken by: Darral Dash,  November 05, 2009 12:01 PM  Follow-up for Phone Call        please have pt check with her pharm or ins co to see is symbicort is less expensive Follow-up by: D. Thomos Lemons DO,  November 05, 2009 2:39 PM  Additional Follow-up for Phone Call Additional follow up Details #1::        Left message for pt to return call Additional Follow-up by: Darral Dash,  November 07, 2009 10:48 AM    Additional Follow-up for Phone Call Additional follow up Details #2::    Spoke with pt  she wants to see Oncologist at [facility] before get any other medication  Follow-up by: Darral Dash,  November 07, 2009 2:38 PM

## 2010-08-26 NOTE — Assessment & Plan Note (Signed)
Summary: CPX/DT   Vital Signs:  Patient profile:   65 year old female Height:      65 inches Weight:      112.25 pounds BMI:     18.75 O2 Sat:      98 % on Room air Temp:     97.5 degrees F Pulse rate:   78 / minute Pulse rhythm:   regular Resp:     18 per minute BP sitting:   100 / 60  (right arm) Cuff size:   regular  Vitals Entered By: Glendell Docker CMA (May 09, 2010 8:42 AM)  O2 Flow:  Room air CC: CPX Is Patient Diabetic? No Pain Assessment Patient in pain? no      Comments patient declined to have EKG due to cost, refill on Fosamax   Primary Care Bethaney Oshana:  Dondra Spry DO  CC:  CPX.  History of Present Illness: 65 y/o white female with hx of copd for routine cpx    Current Diet Breakfast: bagel - butter Lunch: bowel of raisin bran cereal, soy milk DInner: mineastrone soup, occ sweat potatoe, occ hamburger Snacks: Beverage:   Preventive Screening-Counseling & Management  Alcohol-Tobacco     Alcohol drinks/day: 0     Smoking Status: current     Smoking Cessation Counseling: yes     Smoke Cessation Stage: contemplative  Caffeine-Diet-Exercise     Caffeine use/day: None     Does Patient Exercise: yes     Type of exercise: walking     Times/week: 5  Allergies: 1)  ! Symbicort (Budesonide-Formoterol Fumarate)  Past History:  Past Medical History: COPD - Dr.  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  The patient denies anorexia, fever, chest pain, abdominal pain, melena, hematochezia, severe indigestion/heartburn, and depression.     Physical Exam  General:  alert, well-developed, and well-nourished.   Head:  normocephalic and atraumatic.   Eyes:  pupils equal, pupils round, and pupils reactive to light.   Ears:  R ear normal and L ear normal.   Mouth:  pharynx pink and moist.   Neck:  supple, no masses, and no carotid bruits.   Lungs:  decreases breath sounds right mid lung feel.   normal respiratory effort and no wheezes.   Heart:  normal rate, regular rhythm, and no gallop.   Abdomen:  soft, non-tender, normal bowel sounds, no hepatomegaly, and no splenomegaly.   Extremities:  No lower extremity edema Neurologic:  cranial nerves II-XII intact and gait normal.   Psych:  normally interactive, good eye contact, not anxious appearing, and not depressed appearing.     Impression & Recommendations:  Problem # 1:  HEALTH SCREENING (ICD-V70.0) Reviewed adult health maintenance protocols.  Mammogram: normal (01/31/2010) Pap smear: normal (01/31/2010) Colonoscopy: 3 Polyps removed (01/16/2008) Bone Density: osteopenia (07/27/2006) Td Booster: Historical (04/19/2006)   Flu Vax: Fluvax Non-MCR (05/08/2009)   Pneumovax: Historical (04/19/2006) Chol: 247 (11/19/2008)   HDL: 53.50 (11/19/2008)   LDL:  124 (11/17/2007)   TG: 115.0 (11/19/2008) TSH: 1.25 (11/19/2008)     Problem # 2:  OSTEOPOROSIS (ICD-733.00) Assessment: Unchanged  Her updated medication list for this problem includes:    Alendronate Sodium 70 Mg Tabs (Alendronate sodium) ..... One by mouth q weekly  Problem # 3:  TOBACCO USER (ICD-305.1)  Encouraged smoking cessation and discussed different methods for smoking cessation.   Complete Medication List: 1)  Alendronate Sodium 70 Mg Tabs (Alendronate sodium) .... One by mouth q weekly 2)  Vitamin D 1000 Unit Tabs (Cholecalciferol) .... Take 1 tablet by mouth once a day 3)  Citracal/vitamin D 250-200 Mg-unit Tabs (Calcium citrate-vitamin d) .... 2 tablets by mouth once daily  Patient Instructions: 1)   Please schedule a follow-up appointment in 1 year. 2)  Lipid Panel prior to visit, ICD-9:  272.4 3)  High sensitivity CRP :  272.4 4)  Please return for lab work in July, 2011. Prescriptions: ALENDRONATE SODIUM 70 MG TABS (ALENDRONATE SODIUM) one by mouth q weekly  #4 x 11   Entered and Authorized by:   D. Thomos Lemons DO   Signed by:   D. Thomos Lemons DO on 05/09/2010   Method used:   Electronically to        [facility] # [phone]* (retail)       [address]       [place], [place]  [address]       Ph: [phone]       Fax: [phone]   RxID:   [id number]     Current Allergies (reviewed today): ! SYMBICORT (BUDESONIDE-FORMOTEROL FUMARATE)     Preventive Care Screening  Mammogram:    Date:  01/31/2010    Results:  normal   Pap Smear:    Date:  01/31/2010    Results:  normal

## 2010-08-26 NOTE — Progress Notes (Signed)
  Phone Note Outgoing Call   Summary of Call: call pt - we need to obtain f/u x ray of left shoulder.  chest x ray showed bone abnormality of left shoulder Initial call taken by: D. Thomos Lemons DO,  October 15, 2009 5:38 PM  Follow-up for Phone Call        Call to Pt   notified as directed   Appt shoulder xray March 24 Follow-up by: Darral Dash,  October 16, 2009 8:38 AM  New Problems: OTHER DISORDERS OF BONE AND CARTILAGE OTHER (ICD-733.99)   New Problems: OTHER DISORDERS OF BONE AND CARTILAGE OTHER (ICD-733.99)

## 2010-08-26 NOTE — Letter (Signed)
Summary: [facility]  [facility]   Imported By: Sherian Rein 02/04/2010 09:35:56  _____________________________________________________________________  External Attachment:    Type:   Image     Comment:   External Document

## 2010-08-26 NOTE — Consult Note (Signed)
Summary: [facility]  [facility]   Imported By: Lanelle Bal 12/04/2009 09:35:08  _____________________________________________________________________  External Attachment:    Type:   Image     Comment:   External Document

## 2010-08-28 NOTE — Progress Notes (Signed)
Summary: appt  Phone Note Call from Patient Call back at Home Phone [phone]   Caller: Patient Call For: Sandford Craze, NP Summary of Call: Pt called wanting to know if she really needs to keep 2 week f/u appt if she is feeling better and why. Please advise.  [name] CMA Duncan Dull)  August 19, 2010 1:36 PM   Follow-up for Phone Call        We generally have patients follow up after acute COPD exacerabations to make sure that their symptoms are resolved.  If her symptoms are completely resolved she can consider cancelling her appointment but that is up to her.  Follow-up by: Lemont Fillers FNP,  August 19, 2010 2:08 PM  Additional Follow-up for Phone Call Additional follow up Details #1::        Advised pt per [facility] instruction and she voices understanding.  Pt states she will call us back if she needs appt.   Additional Follow-up by: Mervin Kung CMA Duncan Dull),  August 19, 2010 2:34 PM

## 2010-08-28 NOTE — Assessment & Plan Note (Signed)
Summary: sick/ss--rm 5   Vital Signs:  Patient profile:   65 year old female Height:      65 inches Weight:      110 pounds BMI:     18.37 O2 Sat:      98 % on Room air Temp:     97.3 degrees F oral Pulse rate:   93 / minute Pulse rhythm:   regular Resp:     16 per minute BP sitting:   100 / 60  (left arm) Cuff size:   regular  Vitals Entered By: Mervin Kung CMA (AAMA) (August 19, 2010 9:22 AM)  O2 Flow:  Room air CC: Pt states she has had a cold x 3 weeks. Has productive cough with yellow sputum. Is Patient Diabetic? No Pain Assessment Patient in pain? no      Comments Pt completed Nasonex. Has added Aspirin 81mg  1 once daily. [name] CMA Duncan Dull)  August 19, 2010 9:29 AM    Primary Care Provider:  Dondra Spry DO  CC:  Pt states she has had a cold x 3 weeks. Has productive cough with yellow sputum.Marland Kitchen  History of Present Illness: Renee Navarro is a 65 year old female who presents with cheif complaint of cough.  Symptoms starte 3 weeks ago.  She has some associated developed nasal congestion.  Denies fever,  + wheezing, + shortness of breath.  Poor energy.  +phlegm- yellow.  Now feeling worse.  Went to bed last night at [facility].  Has tried tylenol cold pills and cough medicine.  Minimal improvement.    Allergies: 1)  ! Symbicort (Budesonide-Formoterol Fumarate)  Review of Systems       see HPI  Physical Exam  General:  Thin white female, appears tired, but awake, alert and in NAD Head:  Normocephalic and atraumatic without obvious abnormalities. No apparent alopecia or balding. Eyes:  PERRLA Ears:  External ear exam shows no significant lesions or deformities.  Otoscopic examination reveals clear canals, tympanic membranes are intact bilaterally without bulging, retraction, inflammation or discharge. Hearing is grossly normal bilaterally. Mouth:  tongue appears dry Neck:  No deformities, masses, or tenderness noted. Lungs:  fair air movement to bases.    Normal respiratory effort and no accessory muscle use.   Heart:  Normal rate and regular rhythm. S1 and S2 normal without gallop, murmur, click, rub or other extra sounds. Extremities:  No peripheral edema   Impression & Recommendations:  Problem # 1:  CHRONIC OBSTRUCTIVE PULMONARY DISEASE, ACUTE EXACERBATION (ICD-491.21) Assessment New Will plan treatement with prednisone taper, albuterol, and empiric doxycycline for bronchitis.  She is very concerned about cost and declined a xopenex treatment here in the office today.  CXR was performed and was negative for pneumonia.     Complete Medication List: 1)  Alendronate Sodium 70 Mg Tabs (Alendronate sodium) .... One by mouth q weekly 2)  Vitamin D 1000 Unit Tabs (Cholecalciferol) .... Take 1 tablet by mouth once a day 3)  Citracal/vitamin D 250-200 Mg-unit Tabs (Calcium citrate-vitamin d) .... 2 tablets by mouth once daily 4)  Nasonex 50 Mcg/act Susp (Mometasone furoate) .... Two sprays each nostril once daily 5)  Aspirin 81 Mg Tbec (Aspirin) .... Take 1 tablet by mouth once a day. 6)  Prednisone 10 Mg Tabs (Prednisone) .... Take 4 tablets daily x 2 days, then 3 tabs daily x 2 days, then 2 tabs daily x 2 days, then 1 tab daily for 2 days then stop. 7)  Proventil Hfa 108 (90 Base) Mcg/act Aers (Albuterol sulfate) .... 2 puffs every 6 hours as needed for shortness of breath wheezing 8)  Doxycycline Hyclate 100 Mg Caps (Doxycycline hyclate) .... One cap by mouth two times a day for 7 days  Other Orders: CXR- 2view (CXR)  Patient Instructions: 1)  Call if your symptoms worsen or do not improve in 48 to 72 hours. 2)  Follow up with Dr. Artist Pais in 2 weeks. Prescriptions: DOXYCYCLINE HYCLATE 100 MG CAPS (DOXYCYCLINE HYCLATE) one cap by mouth two times a day for 7 days  #14 x 0   Entered and Authorized by:   Lemont Fillers FNP   Signed by:   Lemont Fillers FNP on 08/19/2010   Method used:   Electronically to        [facility] #  [phone]* (retail)       [address]       [place], [place]  [address]       Ph: [phone]       Fax: [phone]   RxID:   [id number] PROVENTIL HFA 108 (90 BASE) MCG/ACT AERS (ALBUTEROL SULFATE) 2 puffs every 6 hours as needed for shortness of breath wheezing  #1 x 0   Entered and Authorized by:   Lemont Fillers FNP   Signed by:   Lemont Fillers FNP on 08/19/2010   Method used:   Electronically to        [facility] # [phone]* (retail)       [address]       [place], [place]  [address]       Ph: [phone]       Fax: [phone]   RxID:   [id number] PREDNISONE 10 MG TABS (PREDNISONE) take 4 tablets daily x 2 days, then 3 tabs daily x 2 days, then 2 tabs daily x 2 days, then 1 tab daily for 2 days then stop.  #20 x 0   Entered and Authorized by:   Lemont Fillers FNP   Signed by:   Lemont Fillers FNP on 08/19/2010   Method used:   Electronically to        [facility] # [phone]* (retail)       [address]       [place], [place]  [address]       Ph: [phone]       Fax: [phone]   RxID:   [id number]    Orders Added: 1)  CXR- 2view [CXR] 2)  Est. Patient Level III [[address]]    Current Allergies (reviewed today): ! SYMBICORT (BUDESONIDE-FORMOTEROL FUMARATE)

## 2010-12-29 ENCOUNTER — Other Ambulatory Visit: Payer: Self-pay | Admitting: Obstetrics and Gynecology

## 2010-12-29 DIAGNOSIS — Z1231 Encounter for screening mammogram for malignant neoplasm of breast: Secondary | ICD-10-CM

## 2011-01-19 ENCOUNTER — Other Ambulatory Visit: Payer: Self-pay | Admitting: Obstetrics and Gynecology

## 2011-01-19 DIAGNOSIS — Z1231 Encounter for screening mammogram for malignant neoplasm of breast: Secondary | ICD-10-CM

## 2011-01-19 LAB — HM MAMMOGRAPHY: HM Mammogram: NORMAL

## 2011-01-22 ENCOUNTER — Inpatient Hospital Stay (HOSPITAL_BASED_OUTPATIENT_CLINIC_OR_DEPARTMENT_OTHER): Admission: RE | Admit: 2011-01-22 | Payer: Self-pay | Source: Ambulatory Visit

## 2011-01-22 ENCOUNTER — Ambulatory Visit (HOSPITAL_BASED_OUTPATIENT_CLINIC_OR_DEPARTMENT_OTHER)
Admission: RE | Admit: 2011-01-22 | Discharge: 2011-01-22 | Disposition: A | Discharge status: Home / Self Care | Payer: BC Managed Care – PPO | Source: Ambulatory Visit | Attending: Obstetrics and Gynecology | Admitting: Obstetrics and Gynecology

## 2011-01-22 DIAGNOSIS — Z1231 Encounter for screening mammogram for malignant neoplasm of breast: Secondary | ICD-10-CM

## 2011-02-05 ENCOUNTER — Other Ambulatory Visit: Payer: Self-pay | Admitting: Obstetrics and Gynecology

## 2011-02-05 ENCOUNTER — Other Ambulatory Visit (HOSPITAL_COMMUNITY)
Admission: RE | Admit: 2011-02-05 | Discharge: 2011-02-05 | Disposition: A | Discharge status: Home / Self Care | Payer: BC Managed Care – PPO | Source: Ambulatory Visit | Attending: Obstetrics and Gynecology | Admitting: Obstetrics and Gynecology

## 2011-02-05 DIAGNOSIS — Z01419 Encounter for gynecological examination (general) (routine) without abnormal findings: Secondary | ICD-10-CM | POA: Insufficient documentation

## 2011-02-05 DIAGNOSIS — Z1159 Encounter for screening for other viral diseases: Secondary | ICD-10-CM | POA: Insufficient documentation

## 2011-02-23 ENCOUNTER — Telehealth: Payer: Self-pay | Admitting: *Deleted

## 2011-02-23 NOTE — Telephone Encounter (Signed)
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 Please advise.

## 2011-02-24 NOTE — Telephone Encounter (Signed)
Pls call patient and let her know that I checked her records and it appears that she had a tetanus shot back in 2007- I believe it was with her former provider.  We do not have the official shot record.  If she has received the Tdap vaccine, then she should be covered for pertussis.  If not, we can giver her a booster here in the office.  I recommend that she obtain her shot record from 2007 and we can review.

## 2011-02-24 NOTE — Telephone Encounter (Signed)
Spoke with pt and notified her per [employer] instruction. Pt states she only received tetanus with . Notified pt that per our records, she only received Diptheria and Tetanus inj. In 2007. Ok to get Tdap booster. Nurse visit scheduled for tomorrow at 10am.

## 2011-02-25 ENCOUNTER — Ambulatory Visit (INDEPENDENT_AMBULATORY_CARE_PROVIDER_SITE_OTHER): Payer: BC Managed Care – PPO | Admitting: Family

## 2011-02-25 DIAGNOSIS — Z23 Encounter for immunization: Secondary | ICD-10-CM

## 2011-04-01 ENCOUNTER — Other Ambulatory Visit: Payer: Self-pay | Admitting: *Deleted

## 2011-04-01 NOTE — Telephone Encounter (Signed)
OK to give 3 month supply only.  She is due for physical in October and repeat bone density.

## 2011-04-01 NOTE — Telephone Encounter (Signed)
Left message on machine for pt to return my call. Also needs to verify pharmacy.

## 2011-04-01 NOTE — Telephone Encounter (Signed)
Received call from pt requesting refill on Alendronate for 1 year. Pt last seen 08/19/10 for sick visit, has no future appts on file.  Please advise.

## 2011-04-02 ENCOUNTER — Telehealth: Payer: Self-pay | Admitting: Internal Medicine

## 2011-04-02 NOTE — Telephone Encounter (Signed)
Fax has not been received from requesting pharmacy. Left message on pt's voicemail to call us with pharmacy contact info.

## 2011-04-02 NOTE — Telephone Encounter (Signed)
Pt left message stating she has to be at work by 1pm and will not be able to speak with me after that. Attempted to reach pt and received voicemail.  Left message for pt to return my call and left me know if I may leave a detailed message on her machine as HIPPA states she does not want messages left on phones.

## 2011-04-02 NOTE — Telephone Encounter (Signed)
Pt returned my call stating she would like refill to go to [facility] in [place] close to [employer] and [address]. I asked her if if would be the one on the corner of [address] and she said she does not know the address. She reports that [facility] told her they faxed [place] a request this morning so we could just send it to the one that faxes [place] a request. Pt also states that she will have to call back to schedule her physical for October as she does not have her calender in front of her. Called [facility] on [address] of [address] and they state they have never filled anything for her before.

## 2011-04-02 NOTE — Telephone Encounter (Signed)
Please advise 

## 2011-04-02 NOTE — Telephone Encounter (Signed)
Patient made appt for phy in October but stated that she already had a physical with labs done on 7/12 by Dr. Chilton Si at [facility]. Patient wants to know if she should still have another phy done.

## 2011-04-02 NOTE — Telephone Encounter (Signed)
Yes, she should still have a physical as our focus is different from the GYN focus.  When she comes for her physical we can try to request labs from Dr. Thomasene Lot office and plan not to repeat the lab work that was already completed.

## 2011-04-03 MED ORDER — ALENDRONATE SODIUM 70 MG PO TABS: 70.0000 mg | ORAL_TABLET | ORAL | Status: DC

## 2011-04-03 NOTE — Telephone Encounter (Signed)
Left message on machine to return my call. 

## 2011-04-03 NOTE — Telephone Encounter (Signed)
Pt returned my call and was notified of need to keep physical for October as stated below. Pt states that she contacted her insurance re: bone density and they told her it would not be covered again until January 2013 and she will wait until then to have it repeated.

## 2011-04-03 NOTE — Telephone Encounter (Signed)
Pt left message that she was returning my call. Attempted to reach pt and left message to return my call.

## 2011-04-03 NOTE — Telephone Encounter (Signed)
Pt left message that she was returning my call. Attempted to reach pt and left message to return my call. 

## 2011-04-03 NOTE — Telephone Encounter (Signed)
Pt returned my call stating she would like refill sent to [facility] in [place]. Refill sent.

## 2011-05-07 ENCOUNTER — Encounter: Payer: Self-pay | Admitting: Family

## 2011-05-08 ENCOUNTER — Encounter: Payer: Self-pay | Admitting: Family

## 2011-05-08 ENCOUNTER — Ambulatory Visit (INDEPENDENT_AMBULATORY_CARE_PROVIDER_SITE_OTHER): Payer: BC Managed Care – PPO | Admitting: Family

## 2011-05-08 VITALS — BP 90/68 | HR 78 | Temp 97.8°F | Resp 16 | Ht 64.0 in | Wt 109.0 lb

## 2011-05-08 DIAGNOSIS — Z23 Encounter for immunization: Secondary | ICD-10-CM

## 2011-05-08 DIAGNOSIS — Z0000 Encounter for general adult medical examination without abnormal findings: Secondary | ICD-10-CM

## 2011-05-08 MED ORDER — ALENDRONATE SODIUM 70 MG PO TABS: 70.0000 mg | ORAL_TABLET | ORAL | Status: DC

## 2011-05-08 MED ORDER — VARENICLINE TARTRATE 0.5 MG PO TABS: 0.5000 mg | ORAL_TABLET | Freq: Two times a day (BID) | ORAL | Status: AC

## 2011-05-08 NOTE — Progress Notes (Signed)
Subjective:    Patient ID: Renee Navarro, female    DOB: May 24, 1946, 65 y.o.   MRN: [id number]  HPI Ms.  Navarro is a 65 yr old female who presents today for her annual physical.    Preventative- Wants flu shot. She reports that she had colo at age 20, was told that she needs one at age 54. Pt had pap last summer, normal.  Mammogram normal at that time as well.  Due for bone density.  Though she wishes to defer at this time due to cost. She is still smoking but motivated to quit. She does report some occasional upper extremity weakness which is intermittent in nature.  Denies chest pain.        Review of Systems  Constitutional: Negative for fever and unexpected weight change.  HENT: Negative for hearing loss.   Eyes: Negative for visual disturbance.  Respiratory: Positive for shortness of breath.   Cardiovascular: Negative for leg swelling.  Gastrointestinal: Negative for nausea, vomiting and diarrhea.  Genitourinary: Negative for dysuria and frequency.  Musculoskeletal: Negative for myalgias and arthralgias.  Skin: Negative for rash.  Neurological: Negative for headaches.  Hematological: Negative for adenopathy.  Psychiatric/Behavioral:       Denies depression/anxiety   Past Medical History  Diagnosis Date  . COPD (chronic obstructive pulmonary disease)   . Osteoporosis   . Tobacco user   . Dyslipidemia     History   Social History  . Marital Status: Married    Spouse Name: N/A    Number of Children: N/A  . Years of Education: N/A   Occupational History  . TEACHER     [employer]   Social History Main Topics  . Smoking status: Current Everyday Smoker -- 0.5 packs/day    Types: Cigarettes  . Smokeless tobacco: Not on file  . Alcohol Use: No  . Drug Use: No  . Sexually Active: Not on file   Other Topics Concern  . Not on file   Social History Narrative   Caffeine Use: occasionalRegular exercise:  No    Past Surgical History  Procedure Date  . Tubal  ligation     Family History  Problem Relation Age of Onset  . Cancer Father     colon  . Diabetes Sister   . Hypertension Sister   . Lymphoma Brother     Allergies  Allergen Reactions  . Symbicort     REACTION: cough    Current Outpatient Prescriptions on File Prior to Visit  Medication Sig Dispense Refill  . aspirin 81 MG tablet Take 81 mg by mouth daily.        . Calcium Citrate-Vitamin D (CITRACAL/VITAMIN D) 250-200 MG-UNIT TABS Take 2 tablets by mouth daily.        . cholecalciferol (VITAMIN D) 1000 UNITS tablet Take 1,000 Units by mouth daily.          BP 90/68  Pulse 78  Temp(Src) 97.8 F (36.6 C) (Oral)  Resp 16  Ht 5\' 4"  (1.626 m)  Wt 109 lb (49.442 kg)  BMI 18.71 kg/m2       Objective:   Physical Exam  Constitutional: She is oriented to person, place, and time. She appears well-developed and well-nourished.  HENT:  Head: Normocephalic and atraumatic.  Right Ear: Tympanic membrane and ear canal normal.  Left Ear: Tympanic membrane and ear canal normal.  Nose: Nose normal.  Mouth/Throat: Uvula is midline, oropharynx is clear and moist and mucous membranes  are normal.  Cardiovascular: Normal rate and regular rhythm.   No murmur heard. Pulmonary/Chest: Effort normal and breath sounds normal. No respiratory distress. She has no wheezes. She has no rales. She exhibits no tenderness.       Diminished breath sounds throughout.  Genitourinary:       Deferred to GYN.  Musculoskeletal: Normal range of motion. She exhibits no edema and no tenderness.  Neurological: She is alert and oriented to person, place, and time. She displays normal reflexes. She exhibits normal muscle tone.  Skin: Skin is warm and dry. No erythema.          Assessment & Plan:

## 2011-05-08 NOTE — Patient Instructions (Signed)
Good luck quitting smoking!

## 2011-05-10 NOTE — Assessment & Plan Note (Signed)
Flu shot today.  Plan for dexa next year.  Discussed chantix as well as side effects.  She would like to try.  Rx provided to the patient.  EKG performed today was abnormal.  Reviewed with Dr. Riley Kill who felt that changes were due to axis deviation and recommended Echo.  Will discuss with patient.

## 2011-05-11 ENCOUNTER — Telehealth: Payer: Self-pay | Admitting: Family

## 2011-05-11 NOTE — Telephone Encounter (Signed)
Spoke with patient re: Dr. Carles Collet recommendation that pt have 2-D echo.  She wishes to call cardiology and check on the cost of this test.  She will let me know if she decides to proceed. She also clarified that her insurance will cover the dexa in January and she wishes to postpone until that time.

## 2011-12-23 ENCOUNTER — Telehealth: Payer: Self-pay | Admitting: *Deleted

## 2011-12-23 DIAGNOSIS — Z0000 Encounter for general adult medical examination without abnormal findings: Secondary | ICD-10-CM

## 2011-12-23 NOTE — Telephone Encounter (Signed)
Pt called wanting to get orders placed for her mammogram and DEXA for some time in July. Pt does not want to go to the [place] office for DEXA, wants to have done in [facility]. Please call pt when appointments have been arranged. Pt wants to do both tests at the same time. Please advise.

## 2011-12-24 ENCOUNTER — Other Ambulatory Visit: Payer: Self-pay | Admitting: Obstetrics and Gynecology

## 2011-12-24 ENCOUNTER — Telehealth: Payer: Self-pay | Admitting: *Deleted

## 2011-12-24 DIAGNOSIS — Z1231 Encounter for screening mammogram for malignant neoplasm of breast: Secondary | ICD-10-CM

## 2011-12-24 NOTE — Telephone Encounter (Signed)
Patient called and left voice message stating she would like to have a bone density test done at the [facility] in [facility] on a Thursday or Friday morning around 9 am.

## 2011-12-25 NOTE — Telephone Encounter (Signed)
Placed orders.  Renee Navarro will call her with apts.

## 2012-01-01 ENCOUNTER — Encounter: Payer: Self-pay | Admitting: Family

## 2012-01-04 ENCOUNTER — Telehealth: Payer: Self-pay | Admitting: Family

## 2012-01-04 NOTE — Telephone Encounter (Signed)
Reviewed bone density results with pt. They are improved compared to bone density in 2010.  Pt to continue caltrate and fosamax.

## 2012-01-14 ENCOUNTER — Encounter: Payer: Self-pay | Admitting: Family

## 2012-02-05 ENCOUNTER — Ambulatory Visit (HOSPITAL_BASED_OUTPATIENT_CLINIC_OR_DEPARTMENT_OTHER)
Admission: RE | Admit: 2012-02-05 | Discharge: 2012-02-05 | Disposition: A | Discharge status: Home / Self Care | Payer: BC Managed Care – PPO | Source: Ambulatory Visit | Attending: Obstetrics and Gynecology | Admitting: Obstetrics and Gynecology

## 2012-02-05 DIAGNOSIS — Z1231 Encounter for screening mammogram for malignant neoplasm of breast: Secondary | ICD-10-CM

## 2012-02-29 ENCOUNTER — Encounter: Payer: Self-pay | Admitting: Family

## 2012-02-29 ENCOUNTER — Ambulatory Visit (HOSPITAL_BASED_OUTPATIENT_CLINIC_OR_DEPARTMENT_OTHER)
Admission: RE | Admit: 2012-02-29 | Discharge: 2012-02-29 | Disposition: A | Discharge status: Home / Self Care | Payer: BC Managed Care – PPO | Source: Ambulatory Visit | Attending: Family | Admitting: Family

## 2012-02-29 ENCOUNTER — Ambulatory Visit (INDEPENDENT_AMBULATORY_CARE_PROVIDER_SITE_OTHER): Payer: BC Managed Care – PPO | Admitting: Family

## 2012-02-29 ENCOUNTER — Telehealth: Payer: Self-pay | Admitting: Family

## 2012-02-29 VITALS — BP 100/60 | HR 84 | Temp 97.6°F | Resp 18 | Ht 64.0 in | Wt 105.1 lb

## 2012-02-29 DIAGNOSIS — Z87891 Personal history of nicotine dependence: Secondary | ICD-10-CM | POA: Insufficient documentation

## 2012-02-29 DIAGNOSIS — R911 Solitary pulmonary nodule: Secondary | ICD-10-CM | POA: Insufficient documentation

## 2012-02-29 DIAGNOSIS — Z8701 Personal history of pneumonia (recurrent): Secondary | ICD-10-CM | POA: Insufficient documentation

## 2012-02-29 DIAGNOSIS — J189 Pneumonia, unspecified organism: Secondary | ICD-10-CM

## 2012-02-29 DIAGNOSIS — F17200 Nicotine dependence, unspecified, uncomplicated: Secondary | ICD-10-CM

## 2012-02-29 HISTORY — DX: Solitary pulmonary nodule: R91.1

## 2012-02-29 NOTE — Patient Instructions (Addendum)
Please complete your CT on the first floor.  We will contact you with results.

## 2012-02-29 NOTE — Assessment & Plan Note (Signed)
We discussed smoking cessation 

## 2012-02-29 NOTE — Telephone Encounter (Signed)
Left message for pt to return my call. When pt calls back please let her know that CT does not show a lung nodule as questioned on x-ray.  It does not some possible scarring at the top of both lungs which the radiologist has recommended be re-evaluated by CT in 3 months to make sure that this change is stable.  Pneumonia appears resolved.  She should finish up any prednisone or levaquin that she has left though.

## 2012-02-29 NOTE — Telephone Encounter (Signed)
Reviewed CT results with pt.  Also reviewed radiologist's recommendation to repeat CT in 3 months to ensure stability of the bilateral upper lobe changes/? Scarring.  She reports that she will likely hold off on repeat CT due to cost.

## 2012-02-29 NOTE — Telephone Encounter (Signed)
Melissa, did you already speak with pt re: CT result?

## 2012-02-29 NOTE — Assessment & Plan Note (Signed)
Clinically improving.  Pt to complete abx and pred taper.  Continue prn albuterol.  I also encouraged her to use prior to exercise as she reports that she often becomes short of breath during exercise.

## 2012-02-29 NOTE — Assessment & Plan Note (Addendum)
Review of old records from 2008 that the patient brings with her today show that she had a RUL nodule back in 2008  which measured 1.5 x 0.6 cm. Recommended CT chest to further evaluate.

## 2012-02-29 NOTE — Progress Notes (Signed)
  Subjective:    Patient ID: Renee Navarro, female    DOB: 31-May-1946, 66 y.o.   MRN: [id number]  HPI  Renee Navarro is a 66 yr old female who presents today to discuss a lung nodule noted on cxr at Urgent Care.  She was seen on 7/27 at [facility] due to shortness of breath and cough and was noted on x-ray to have a right lower lobe infiltrate.  Note was also made of an apparent RUL nodule and a CT was recommended for further evaluation. She was started on Levaquin, prednisone taper, tessalon, and proventil mdi prn. She initially had SOB which prompted her to seek treatment at the urgent care. She reports that her shortness of breath has improved.  She denies recent fever.  She continues to have a cough but notes that this is improving.    Review of Systems see HPI    Past Medical History  Diagnosis Date  . COPD (chronic obstructive pulmonary disease)   . Osteoporosis   . Tobacco user   . Dyslipidemia     History   Social History  . Marital Status: Married    Spouse Name: N/A    Number of Children: N/A  . Years of Education: N/A   Occupational History  . TEACHER     [employer]   Social History Main Topics  . Smoking status: Current Everyday Smoker -- 0.5 packs/day    Types: Cigarettes  . Smokeless tobacco: Not on file  . Alcohol Use: No  . Drug Use: No  . Sexually Active: Not on file   Other Topics Concern  . Not on file   Social History Narrative   Caffeine Use: occasionalRegular exercise:  No    Past Surgical History  Procedure Date  . Tubal ligation     Family History  Problem Relation Age of Onset  . Cancer Father     colon  . Diabetes Sister   . Hypertension Sister   . Lymphoma Brother     Allergies  Allergen Reactions  . Budesonide-Formoterol Fumarate     REACTION: cough    Current Outpatient Prescriptions on File Prior to Visit  Medication Sig Dispense Refill  . alendronate (FOSAMAX) 70 MG tablet Take 1 tablet (70 mg total) by mouth every 7  (seven) days. Take with a full glass of water on an empty stomach.  12 tablet  3  . aspirin 81 MG tablet Take 81 mg by mouth daily.        . Calcium Citrate-Vitamin D (CITRACAL/VITAMIN D) 250-200 MG-UNIT TABS Take 2 tablets by mouth daily.        Marland Kitchen PROAIR HFA 108 (90 BASE) MCG/ACT inhaler Take 108 mcg by mouth as needed.        BP 100/60  Pulse 84  Temp 97.6 F (36.4 C) (Oral)  Resp 18  Ht 5\' 4"  (1.626 m)  Wt 105 lb 1.9 oz (47.682 kg)  BMI 18.04 kg/m2  SpO2 95%    Objective:   Physical Exam  Constitutional: She appears well-developed and well-nourished. No distress.  Cardiovascular: Normal rate and regular rhythm.   No murmur heard. Pulmonary/Chest: Effort normal. No respiratory distress. She has no wheezes. She has no rales. She exhibits no tenderness.       Diminished breath sounds throughout          Assessment & Plan:

## 2012-05-13 ENCOUNTER — Ambulatory Visit (INDEPENDENT_AMBULATORY_CARE_PROVIDER_SITE_OTHER): Payer: BC Managed Care – PPO | Admitting: Family

## 2012-05-13 DIAGNOSIS — Z23 Encounter for immunization: Secondary | ICD-10-CM

## 2012-05-17 ENCOUNTER — Telehealth: Payer: Self-pay | Admitting: *Deleted

## 2012-05-17 NOTE — Telephone Encounter (Signed)
Pt called requesting a refill on her alendronate for 1 yr. Pt last seen on 02/29/12. No future appts. Please advise.

## 2012-05-17 NOTE — Telephone Encounter (Signed)
OK to send 4 tabs with 11 refills or 12 tabs with 3 refills- whichever pt prefers.  Pls also let her know that I see she is due for a follow up chest CT in November to follow up on an area of scarring noted on CT in August.  Is she agreeable to proceed with CT?

## 2012-05-18 MED ORDER — ALENDRONATE SODIUM 70 MG PO TABS: 70.0000 mg | ORAL_TABLET | ORAL | Status: DC

## 2012-05-18 NOTE — Telephone Encounter (Signed)
Called pt to check her preference on refill amount. Pt prefers 4 tabs with 11 refills. Told pt that she is due for a follow up chest CT in Nov. Pt says she is aware of the follow up chest CT, but cannot afford it at this time.

## 2012-05-19 ENCOUNTER — Telehealth: Payer: Self-pay | Admitting: *Deleted

## 2012-05-19 ENCOUNTER — Other Ambulatory Visit: Payer: Self-pay | Admitting: *Deleted

## 2012-05-19 MED ORDER — ALENDRONATE SODIUM 70 MG PO TABS: 70.0000 mg | ORAL_TABLET | ORAL | Status: DC

## 2012-05-19 NOTE — Telephone Encounter (Signed)
Kathrine from [employer] called stating the pt wants to change her refill of Fosamax from 4 tablets with 11 rf to 12 tablets and 3 refills. Reordered rx and sent to pharmacy.

## 2012-06-03 ENCOUNTER — Telehealth: Payer: Self-pay | Admitting: *Deleted

## 2012-06-03 NOTE — Telephone Encounter (Signed)
Received message from pt stating she is due for her repeat CT scan but wants to know if it is okay to postpone it until after the Holidays.  Please advise.

## 2012-06-06 NOTE — Telephone Encounter (Signed)
Radiology recommendation is 3 month follow up.  (early November).  If she needs to wait it should be done as soon as she is able.

## 2012-06-07 NOTE — Telephone Encounter (Signed)
Notified pt and she states she will discuss further at her follow up appt tomorrow. Pt requests to have her follow up imaging done at [facility] due to cost.

## 2012-06-08 ENCOUNTER — Ambulatory Visit (INDEPENDENT_AMBULATORY_CARE_PROVIDER_SITE_OTHER): Payer: BC Managed Care – PPO | Admitting: Family

## 2012-06-08 ENCOUNTER — Encounter: Payer: Self-pay | Admitting: Family

## 2012-06-08 VITALS — BP 110/70 | HR 66 | Temp 97.9°F | Resp 14

## 2012-06-08 DIAGNOSIS — R911 Solitary pulmonary nodule: Secondary | ICD-10-CM

## 2012-06-08 DIAGNOSIS — J329 Chronic sinusitis, unspecified: Secondary | ICD-10-CM

## 2012-06-08 MED ORDER — AMOXICILLIN-POT CLAVULANATE 875-125 MG PO TABS: 1.0000 | ORAL_TABLET | Freq: Two times a day (BID) | ORAL | Status: DC

## 2012-06-08 MED ORDER — FLUTICASONE PROPIONATE 50 MCG/ACT NA SUSP: 2.0000 | Freq: Every day | NASAL | Status: DC

## 2012-06-08 NOTE — Assessment & Plan Note (Signed)
Suspect sinusitis.  Rx with augmentin and flonase. Pt to call if symptoms worsen or if not improved in 1 week.

## 2012-06-08 NOTE — Progress Notes (Signed)
  Subjective:    Patient ID: Renee Navarro, female    DOB: 02-19-1946, 66 y.o.   MRN: [id number]  HPI  Renee Navarro is a 66 yr old female who presents today with chief complaint of nasal congestion x 3 weeks. She reports associated cough.  Nasal drainage is clear.  She has been taking otc cough medicine and tylenol cold at night. She has not had much relief with these regimens.     Review of Systems See HPI  Past Medical History  Diagnosis Date  . COPD (chronic obstructive pulmonary disease)   . Osteoporosis   . Tobacco user   . Dyslipidemia     History   Social History  . Marital Status: Married    Spouse Name: N/A    Number of Children: N/A  . Years of Education: N/A   Occupational History  . TEACHER     [employer]   Social History Main Topics  . Smoking status: Current Every Day Smoker -- 0.5 packs/day    Types: Cigarettes  . Smokeless tobacco: Not on file  . Alcohol Use: No  . Drug Use: No  . Sexually Active: Not on file   Other Topics Concern  . Not on file   Social History Narrative   Caffeine Use: occasionalRegular exercise:  No    Past Surgical History  Procedure Date  . Tubal ligation     Family History  Problem Relation Age of Onset  . Cancer Father     colon  . Diabetes Sister   . Hypertension Sister   . Lymphoma Brother     Allergies  Allergen Reactions  . Budesonide-Formoterol Fumarate     REACTION: cough    Current Outpatient Prescriptions on File Prior to Visit  Medication Sig Dispense Refill  . alendronate (FOSAMAX) 70 MG tablet Take 1 tablet (70 mg total) by mouth every 7 (seven) days. Take with a full glass of water on an empty stomach.  12 tablet  3  . aspirin 81 MG tablet Take 81 mg by mouth daily.        . Calcium Citrate-Vitamin D (CITRACAL/VITAMIN D) 250-200 MG-UNIT TABS Take 2 tablets by mouth daily.        Marland Kitchen PROAIR HFA 108 (90 BASE) MCG/ACT inhaler Take 108 mcg by mouth as needed.      . vitamin D, CHOLECALCIFEROL, 400  UNITS tablet Take 400 Units by mouth daily.      . fluticasone (FLONASE) 50 MCG/ACT nasal spray Place 2 sprays into the nose daily.  16 g  2    BP 110/70  Pulse 66  Temp 97.9 F (36.6 C) (Oral)  Resp 14  SpO2 96%       Objective:   Physical Exam  Constitutional: She appears well-developed and well-nourished. No distress.  HENT:  Head: Normocephalic and atraumatic.  Right Ear: Tympanic membrane and ear canal normal.  Left Ear: Tympanic membrane and ear canal normal.  Cardiovascular: Normal rate and regular rhythm.   No murmur heard. Pulmonary/Chest: Effort normal and breath sounds normal. No respiratory distress. She has no rales. She exhibits no tenderness.       Diminished breath sounds throughout.  Soft right sided expiratory wheeze which cleared on own with deep inspiration.          Assessment & Plan:

## 2012-06-08 NOTE — Patient Instructions (Addendum)
Please call if symptoms worsen, or if not improved in 1 week.  

## 2012-06-08 NOTE — Assessment & Plan Note (Signed)
She does not wish to schedule follow up CT yet,  But will contact us when she is ready.

## 2012-06-10 ENCOUNTER — Ambulatory Visit: Payer: BC Managed Care – PPO | Admitting: Internal Medicine

## 2012-06-10 ENCOUNTER — Ambulatory Visit: Payer: BC Managed Care – PPO

## 2012-06-17 ENCOUNTER — Telehealth: Payer: Self-pay | Admitting: *Deleted

## 2012-06-17 DIAGNOSIS — R9389 Abnormal findings on diagnostic imaging of other specified body structures: Secondary | ICD-10-CM

## 2012-06-17 NOTE — Telephone Encounter (Signed)
Received call from pt stating she will be scheduling her follow up CT chest without contrast at [facility] next week. They use CPT [id number] and requests that we fax them an order so she can scheduled her appt. Please advise when order has been placed.

## 2012-06-17 NOTE — Telephone Encounter (Signed)
Order has been placed.

## 2012-07-08 ENCOUNTER — Telehealth: Payer: Self-pay | Admitting: Family

## 2012-07-08 DIAGNOSIS — R911 Solitary pulmonary nodule: Secondary | ICD-10-CM

## 2012-07-08 NOTE — Telephone Encounter (Signed)
Reviewed CT chest- notes 2 x 1.3 cm solid nodule in the posterior right upper lobe- biopsy is recommended.  I have arranged apt on 12/19 at 11:45 AM with pulmonology (Dr.Wright).Results and plan reviewed with pt.  I have asked pt to bring a copy of the CT images on CT.  Pt verbalizes understanding.

## 2012-07-08 NOTE — Telephone Encounter (Signed)
Patient is requesting CT results.

## 2012-07-14 ENCOUNTER — Encounter: Payer: Self-pay | Admitting: Critical Care Medicine

## 2012-07-14 ENCOUNTER — Ambulatory Visit (INDEPENDENT_AMBULATORY_CARE_PROVIDER_SITE_OTHER): Payer: BC Managed Care – PPO | Admitting: Critical Care Medicine

## 2012-07-14 VITALS — BP 98/64 | HR 85 | Temp 97.9°F | Ht 64.0 in | Wt 108.0 lb

## 2012-07-14 DIAGNOSIS — Z23 Encounter for immunization: Secondary | ICD-10-CM

## 2012-07-14 DIAGNOSIS — R911 Solitary pulmonary nodule: Secondary | ICD-10-CM

## 2012-07-14 DIAGNOSIS — F17200 Nicotine dependence, unspecified, uncomplicated: Secondary | ICD-10-CM

## 2012-07-14 DIAGNOSIS — J449 Chronic obstructive pulmonary disease, unspecified: Secondary | ICD-10-CM

## 2012-07-14 NOTE — Progress Notes (Signed)
Subjective:    Patient ID: Renee Navarro, female    DOB: 1946-04-19, 66 y.o.   MRN: [id number]  HPI 66 y.o.F  This patient referred for evaluation of right lower lobe lung nodule. The patient initially had a CT scan in March 2008 which showed a half centimeter nodule in the right lower lobe. Repeat CT scan in September 2008 begin showed significant emphysematous changes and associated 0.5 x 0.6 cm right upper lobe pleural-based mass like lesion. It was smaller than lesions seen in March 2008. The patient then transferred her care over to a different provider and a repeat CT scan of the chest was obtained in August 2013 after an abnormal chest x-ray is obtained. At that time the patient's having bronchitic symptoms and was continued to smoke a half pack a day of cigarettes. The chest CT scan at this point showed the lesion to be slightly larger in and a repeat CT scan has just been obtained which shows again a 2 x 1.5 cm lesion in the right upper lobe that is pleural-based . Patient is now referred for evaluation of this same lesion. Now does cough up phlegm. No blood or [place] /yellow.  Pt stays active.   No chest pain.   Past Medical History  Diagnosis Date  . COPD (chronic obstructive pulmonary disease)   . Osteoporosis   . Tobacco user   . Dyslipidemia   . Lung nodule seen on imaging study      Family History  Problem Relation Age of Onset  . Cancer Father     colon  . Diabetes Sister   . Hypertension Sister   . Lymphoma Brother      History   Social History  . Marital Status: Married    Spouse Name: N/A    Number of Children: N/A  . Years of Education: N/A   Occupational History  . TEACHER     [employer]   Social History Main Topics  . Smoking status: Current Every Day Smoker -- 0.5 packs/day for 51 years    Types: Cigarettes  . Smokeless tobacco: Not on file  . Alcohol Use: No  . Drug Use: No  . Sexually Active: Not on file   Other Topics Concern  . Not on  file   Social History Narrative   Caffeine Use: occasionalRegular exercise:  No     Allergies  Allergen Reactions  . Budesonide-Formoterol Fumarate     REACTION: cough     Outpatient Prescriptions Prior to Visit  Medication Sig Dispense Refill  . alendronate (FOSAMAX) 70 MG tablet Take 1 tablet (70 mg total) by mouth every 7 (seven) days. Take with a full glass of water on an empty stomach.  12 tablet  3  . aspirin 81 MG tablet Take 81 mg by mouth daily.        . Calcium Citrate-Vitamin D (CITRACAL/VITAMIN D) 250-200 MG-UNIT TABS Take 2 tablets by mouth daily.        Marland Kitchen PROAIR HFA 108 (90 BASE) MCG/ACT inhaler Take 108 mcg by mouth as needed.      . vitamin D, CHOLECALCIFEROL, 400 UNITS tablet Take 400 Units by mouth daily.      . [DISCONTINUED] amoxicillin-clavulanate (AUGMENTIN) 875-125 MG per tablet Take 1 tablet by mouth 2 (two) times daily.  20 tablet  0  . [DISCONTINUED] fluticasone (FLONASE) 50 MCG/ACT nasal spray Place 2 sprays into the nose daily.  16 g  2   Last reviewed  on 07/14/2012 11:52 AM by Storm Frisk, MD    Review of Systems  Constitutional: Negative for fever, chills, diaphoresis, activity change, appetite change, fatigue and unexpected weight change.  HENT: Positive for postnasal drip. Negative for hearing loss, ear pain, nosebleeds, congestion, sore throat, facial swelling, rhinorrhea, sneezing, mouth sores, trouble swallowing, neck pain, neck stiffness, dental problem, voice change, sinus pressure, tinnitus and ear discharge.   Eyes: Negative for photophobia, discharge, itching and visual disturbance.  Respiratory: Positive for cough and shortness of breath. Negative for apnea, choking, chest tightness, wheezing and stridor.   Cardiovascular: Negative for chest pain, palpitations and leg swelling.  Gastrointestinal: Negative for nausea, vomiting, abdominal pain, constipation, blood in stool and abdominal distention.  Genitourinary: Negative for dysuria,  urgency, frequency, hematuria, flank pain, decreased urine volume and difficulty urinating.  Musculoskeletal: Negative for myalgias, back pain, joint swelling, arthralgias and gait problem.  Skin: Negative for color change, pallor and rash.  Neurological: Negative for dizziness, tremors, seizures, syncope, speech difficulty, weakness, light-headedness, numbness and headaches.  Hematological: Negative for adenopathy. Does not bruise/bleed easily.  Psychiatric/Behavioral: Negative for confusion, sleep disturbance and agitation. The patient is not nervous/anxious.        Objective:   Physical Exam Filed Vitals:   07/14/12 1139  BP: 98/64  Pulse: 85  Temp: 97.9 F (36.6 C)  TempSrc: Oral  Height: 5\' 4"  (1.626 m)  Weight: 108 lb (48.988 kg)  SpO2: 97%    Gen: Pleasant, well-nourished, in no distress,  normal affect  ENT: No lesions,  mouth clear,  oropharynx clear, no postnasal drip  Neck: No JVD, no TMG, no carotid bruits  Lungs: No use of accessory muscles, no dullness to percussion, distant BS   Cardiovascular: RRR, heart sounds normal, no murmur or gallops, no peripheral edema  Abdomen: soft and NT, no HSM,  BS normal  Musculoskeletal: No deformities, no cyanosis or clubbing  Neuro: alert, non focal  Skin: Warm, no lesions or rashes     Assessment & Plan:   COPD Chronic obstructive lung disease at least gold stage B. by history. Centrilobular emphysema seen on CT scan of chest. The patient continues to pursue smoking. Plan No inhaled medications indicated other than as needed beta agonist Greater than 10 minutes of smoking cessation counseling was given to the patient The patient was highly advised to pursue nicotine replacement therapy utilizing NicoDerm patch and Nicorette minis  Lung nodule There is a right upper lobe 2.0 x 1.5 cm posterior nodule is pleural-based. This area density has increased and decreased in size over the past 6 years. He is always been  present. There is no associated lymphadenopathy. It is likely benign. The patient's smoking habit however makes this a concern for potential malignant transformation. Plan Repeat CT scan in 3 months for followup. Pursue smoking cessation    Updated Medication List Outpatient Encounter Prescriptions as of 07/14/2012  Medication Sig Dispense Refill  . alendronate (FOSAMAX) 70 MG tablet Take 1 tablet (70 mg total) by mouth every 7 (seven) days. Take with a full glass of water on an empty stomach.  12 tablet  3  . aspirin 81 MG tablet Take 81 mg by mouth daily.        . Calcium Citrate-Vitamin D (CITRACAL/VITAMIN D) 250-200 MG-UNIT TABS Take 2 tablets by mouth daily.        Marland Kitchen PROAIR HFA 108 (90 BASE) MCG/ACT inhaler Take 108 mcg by mouth as needed.      Marland Kitchen  vitamin D, CHOLECALCIFEROL, 400 UNITS tablet Take 400 Units by mouth daily.      . [DISCONTINUED] amoxicillin-clavulanate (AUGMENTIN) 875-125 MG per tablet Take 1 tablet by mouth 2 (two) times daily.  20 tablet  0  . [DISCONTINUED] fluticasone (FLONASE) 50 MCG/ACT nasal spray Place 2 sprays into the nose daily.  16 g  2

## 2012-07-14 NOTE — Assessment & Plan Note (Signed)
There is a right upper lobe 2.0 x 1.5 cm posterior nodule is pleural-based. This area density has increased and decreased in size over the past 6 years. He is always been present. There is no associated lymphadenopathy. It is likely benign. The patient's smoking habit however makes this a concern for potential malignant transformation. Plan Repeat CT scan in 3 months for followup. Pursue smoking cessation

## 2012-07-14 NOTE — Assessment & Plan Note (Signed)
Chronic obstructive lung disease at least gold stage B. by history. Centrilobular emphysema seen on CT scan of chest. The patient continues to pursue smoking. Plan No inhaled medications indicated other than as needed beta agonist Greater than 10 minutes of smoking cessation counseling was given to the patient The patient was highly advised to pursue nicotine replacement therapy utilizing NicoDerm patch and Nicorette minis

## 2012-07-14 NOTE — Patient Instructions (Addendum)
Focus on smoking cessation : use nicoderm 21mg  per day patch plus as needed 4mg  nicorette minis 2-4 times per day lozenge Repeat CT Chest in march 2014 at [facility] Return 3 months after CT scan completed Pneumovax given today 07/14/2012

## 2012-07-15 ENCOUNTER — Telehealth: Payer: Self-pay | Admitting: Family

## 2012-07-15 NOTE — Telephone Encounter (Signed)
Agree 

## 2012-07-15 NOTE — Telephone Encounter (Signed)
Advised pt it is common to experience pain and soreness at injection site for a few days. Pt denies redness or swelling at injection site. Advised pt the pain should lessen over the next few days and if not better by Monday to call for an appointment. Pt voices understanding and states she is taking Ibuprofen which is making the pain tolerable. Please advise if there are further instructions.

## 2012-07-15 NOTE — Telephone Encounter (Signed)
Patient called in stating that she received the pneumonia vaccine yesterday and now she is having upper arm pain. She would like to know if this is normal? Also, she would like to know when her last pneumonia vaccine(prior to this one) was administered?

## 2012-07-29 ENCOUNTER — Encounter: Payer: Self-pay | Admitting: Internal Medicine

## 2012-09-28 ENCOUNTER — Telehealth: Payer: Self-pay | Admitting: Critical Care Medicine

## 2012-09-29 NOTE — Telephone Encounter (Signed)
CT Chest scheduled 10/20/12 at [facility]. Pt has changed [employer] policies effective September 24, 2012. Will obtain pre cert and will contact Myriam Jacobson back with authorization number. Rhonda J Cobb

## 2012-09-29 NOTE — Telephone Encounter (Signed)
Obtained new bcbs insurance card and checked on AIM website regarding the pre cert. Per [employer]IM website, CT Chest has been pre certed under the new BCBS policy YPIW # and  had been approved by [employer]. Authorization # [id number] valid from 09/27/12 to 10/26/12 with imaging Facility listed as [facility]. Sent Authorization to be scanned into Epic. Spoke with Myriam Jacobson at [facility] office and she is aware. Rhonda J Cobb

## 2012-10-17 ENCOUNTER — Telehealth: Payer: Self-pay | Admitting: Critical Care Medicine

## 2012-10-17 DIAGNOSIS — R911 Solitary pulmonary nodule: Secondary | ICD-10-CM

## 2012-10-17 NOTE — Telephone Encounter (Signed)
Call this pt and tell her CT Chest is benign. R lung nodule is JUST SCAR TISSUE.  No need for further CT scans. No change since 2008 studies

## 2012-10-18 NOTE — Telephone Encounter (Signed)
pls keep appt so I can f/u on smoking issues

## 2012-10-18 NOTE — Telephone Encounter (Signed)
Called, spoke with pt.  Informed her of CT Chest results and recs per Dr. Delford Field.  She verbalized understanding of this.  She was last seen by PW on 07/14/12:  Patient Instructions    Focus on smoking cessation : use nicoderm 21mg  per day patch plus as needed 4mg  nicorette minis 2-4 times per day lozenge  Repeat CT Chest in march 2014 at [facility]  Return 3 months after CT scan completed  Pneumovax given today 07/14/2012      ------  Dr. Delford Field, Pt has a pending OV with you on this Thursday.  She would like to know is she still needs to keep this appt given results.  Looks like she also sees you for COPD.  Pls advise.  Thank you.

## 2012-10-18 NOTE — Telephone Encounter (Signed)
Called, spoke with pt.  She is aware to keep pending appt with PW on Thursday in [place].  She was ok with this plan and voiced no further questions or concerns at this time.

## 2012-10-18 NOTE — Telephone Encounter (Signed)
lmomtcb for pt 

## 2012-10-20 ENCOUNTER — Encounter: Payer: Self-pay | Admitting: Critical Care Medicine

## 2012-10-20 ENCOUNTER — Ambulatory Visit (INDEPENDENT_AMBULATORY_CARE_PROVIDER_SITE_OTHER): Payer: BC Managed Care – PPO | Admitting: Critical Care Medicine

## 2012-10-20 VITALS — BP 100/60 | HR 68 | Temp 98.4°F | Ht 64.0 in | Wt 108.0 lb

## 2012-10-20 DIAGNOSIS — R911 Solitary pulmonary nodule: Secondary | ICD-10-CM

## 2012-10-20 DIAGNOSIS — J449 Chronic obstructive pulmonary disease, unspecified: Secondary | ICD-10-CM

## 2012-10-20 NOTE — Assessment & Plan Note (Signed)
Right upper lobe lung nodule is likely benign and there has been no significant change since 2008 No additional CT scans indicated

## 2012-10-20 NOTE — Assessment & Plan Note (Signed)
Gold stage B. COPD by spirometry values  the patient now has quit smoking officially Plan The patient will use albuterol as needed and continue to pursue smoking cessation with the nicotine patch Additional inhaled medicines can be considered if the patient does not respond to the albuterol

## 2012-10-20 NOTE — Patient Instructions (Signed)
Try albuterol as needed Nicoderm patch as you are doing Return 6 months

## 2012-10-20 NOTE — Progress Notes (Signed)
Subjective:    Patient ID: Renee Navarro, female    DOB: [birth year], 67 y.o.   MRN: [id number]  HPI  67 y.o.F  This patient referred for evaluation of right lower lobe lung nodule. The patient initially had a CT scan in March 2008 which showed a half centimeter nodule in the right lower lobe. Repeat CT scan in September 2008 begin showed significant emphysematous changes and associated 0.5 x 0.6 cm right upper lobe pleural-based mass like lesion. It was smaller than lesions seen in March 2008. The patient then transferred her care over to a different provider and a repeat CT scan of the chest was obtained in August 2013 after an abnormal chest x-ray is obtained. At that time the patient's having bronchitic symptoms and was continued to smoke a half pack a day of cigarettes. The chest CT scan at this point showed the lesion to be slightly larger in and a repeat CT scan has just been obtained which shows again a 2 x 1.5 cm lesion in the right upper lobe that is pleural-based . Patient is now referred for evaluation of this same lesion. Now does cough up phlegm. No blood or [place] /yellow.  Pt stays active.   No chest pain.  10/20/2012 There is no new complaints. The patient's no longer smoking now using nicotine patch. Pt denies any significant sore throat, nasal congestion or excess secretions, fever, chills, sweats, unintended weight loss, pleurtic or exertional chest pain, orthopnea PND, or leg swelling Pt denies any increase in rescue therapy over baseline, denies waking up needing it or having any early am or nocturnal exacerbations of coughing/wheezing/or dyspnea. Pt also denies any obvious fluctuation in symptoms with  weather or environmental change or other alleviating or aggravating factors     Past Medical History  Diagnosis Date  . COPD (chronic obstructive pulmonary disease)   . Osteoporosis   . Tobacco user   . Dyslipidemia   . Lung nodule seen on imaging study      Family  History  Problem Relation Age of Onset  . Cancer Father     colon  . Diabetes Sister   . Hypertension Sister   . Lymphoma Brother      History   Social History  . Marital Status: Married    Spouse Name: N/A    Number of Children: N/A  . Years of Education: N/A   Occupational History  . TEACHER     [employer]   Social History Main Topics  . Smoking status: Former Smoker -- 0.75 packs/day for 51 years    Types: Cigarettes    Quit date: 07/14/2012  . Smokeless tobacco: Not on file     Comment: Currently using patch.  . Alcohol Use: No  . Drug Use: No  . Sexually Active: Not on file   Other Topics Concern  . Not on file   Social History Narrative   Caffeine Use: occasional   Regular exercise:  No           Allergies  Allergen Reactions  . Budesonide-Formoterol Fumarate     REACTION: cough     Outpatient Prescriptions Prior to Visit  Medication Sig Dispense Refill  . aspirin 81 MG tablet Take 81 mg by mouth daily.        . Calcium Citrate-Vitamin D (CITRACAL/VITAMIN D) 250-200 MG-UNIT TABS Take 1 tablet by mouth daily.       Marland Kitchen PROAIR HFA 108 (90 BASE) MCG/ACT inhaler Take  108 mcg by mouth as needed.      . vitamin D, CHOLECALCIFEROL, 400 UNITS tablet Take 400 Units by mouth daily.      Marland Kitchen alendronate (FOSAMAX) 70 MG tablet Take 1 tablet (70 mg total) by mouth every 7 (seven) days. Take with a full glass of water on an empty stomach.  12 tablet  3   No facility-administered medications prior to visit.      Review of Systems  Constitutional: Negative for fever, chills, diaphoresis, activity change, appetite change, fatigue and unexpected weight change.  HENT: Positive for postnasal drip. Negative for hearing loss, ear pain, nosebleeds, congestion, sore throat, facial swelling, rhinorrhea, sneezing, mouth sores, trouble swallowing, neck pain, neck stiffness, dental problem, voice change, sinus pressure, tinnitus and ear discharge.   Eyes: Negative for  photophobia, discharge, itching and visual disturbance.  Respiratory: Positive for cough and shortness of breath. Negative for apnea, choking, chest tightness, wheezing and stridor.   Cardiovascular: Negative for chest pain, palpitations and leg swelling.  Gastrointestinal: Negative for nausea, vomiting, abdominal pain, constipation, blood in stool and abdominal distention.  Genitourinary: Negative for dysuria, urgency, frequency, hematuria, flank pain, decreased urine volume and difficulty urinating.  Musculoskeletal: Negative for myalgias, back pain, joint swelling, arthralgias and gait problem.  Skin: Negative for color change, pallor and rash.  Neurological: Negative for dizziness, tremors, seizures, syncope, speech difficulty, weakness, light-headedness, numbness and headaches.  Hematological: Negative for adenopathy. Does not bruise/bleed easily.  Psychiatric/Behavioral: Negative for confusion, sleep disturbance and agitation. The patient is not nervous/anxious.        Objective:   Physical Exam  Filed Vitals:   10/20/12 1344  BP: 100/60  Pulse: 68  Temp: 98.4 F (36.9 C)  TempSrc: Oral  Height: 5\' 4"  (1.626 m)  Weight: 108 lb (48.988 kg)  SpO2: 97%    Gen: Pleasant, well-nourished, in no distress,  normal affect  ENT: No lesions,  mouth clear,  oropharynx clear, no postnasal drip  Neck: No JVD, no TMG, no carotid bruits  Lungs: No use of accessory muscles, no dullness to percussion, distant BS   Cardiovascular: RRR, heart sounds normal, no murmur or gallops, no peripheral edema  Abdomen: soft and NT, no HSM,  BS normal  Musculoskeletal: No deformities, no cyanosis or clubbing  Neuro: alert, non focal  Skin: Warm, no lesions or rashes     Assessment & Plan:   Lung nodule Right upper lobe lung nodule is likely benign and there has been no significant change since 2008 No additional CT scans indicated  COPD gold stage B. Gold stage B. COPD by spirometry  values  the patient now has quit smoking officially Plan The patient will use albuterol as needed and continue to pursue smoking cessation with the nicotine patch Additional inhaled medicines can be considered if the patient does not respond to the albuterol    Updated Medication List Outpatient Encounter Prescriptions as of 10/20/2012  Medication Sig Dispense Refill  . aspirin 81 MG tablet Take 81 mg by mouth daily.        . Calcium Citrate-Vitamin D (CITRACAL/VITAMIN D) 250-200 MG-UNIT TABS Take 1 tablet by mouth daily.       Marland Kitchen PROAIR HFA 108 (90 BASE) MCG/ACT inhaler Take 108 mcg by mouth as needed.      . vitamin D, CHOLECALCIFEROL, 400 UNITS tablet Take 400 Units by mouth daily.      . [DISCONTINUED] alendronate (FOSAMAX) 70 MG tablet Take 1 tablet (70 mg total)  by mouth every 7 (seven) days. Take with a full glass of water on an empty stomach.  12 tablet  3   No facility-administered encounter medications on file as of 10/20/2012.

## 2012-10-28 ENCOUNTER — Telehealth: Payer: Self-pay | Admitting: Family

## 2012-10-28 NOTE — Telephone Encounter (Signed)
Patient states that she has stopped taking alendronate and wanted to make sure that this is okay?

## 2012-11-02 NOTE — Telephone Encounter (Signed)
Patient is at work and requested callback with provider information to be left on VM, as she could not write information down; LMOM with contact name and number/SLS

## 2012-11-02 NOTE — Telephone Encounter (Signed)
Ok. She should increase calcium supplement. Can start caltrate +d 600 mg bid.ok to stop separate vit d supplement.

## 2013-01-04 ENCOUNTER — Other Ambulatory Visit: Payer: Self-pay | Admitting: Family

## 2013-01-04 DIAGNOSIS — Z1231 Encounter for screening mammogram for malignant neoplasm of breast: Secondary | ICD-10-CM

## 2013-01-10 ENCOUNTER — Encounter: Payer: BC Managed Care – PPO | Admitting: Family Medicine

## 2013-02-17 ENCOUNTER — Ambulatory Visit (HOSPITAL_BASED_OUTPATIENT_CLINIC_OR_DEPARTMENT_OTHER): Payer: BC Managed Care – PPO

## 2013-02-20 ENCOUNTER — Ambulatory Visit (HOSPITAL_BASED_OUTPATIENT_CLINIC_OR_DEPARTMENT_OTHER)
Admission: RE | Admit: 2013-02-20 | Discharge: 2013-02-20 | Disposition: A | Discharge status: Home / Self Care | Payer: BC Managed Care – PPO | Source: Ambulatory Visit | Attending: Family | Admitting: Family

## 2013-02-20 DIAGNOSIS — Z1231 Encounter for screening mammogram for malignant neoplasm of breast: Secondary | ICD-10-CM

## 2013-04-03 ENCOUNTER — Telehealth: Payer: Self-pay | Admitting: Critical Care Medicine

## 2013-04-03 NOTE — Telephone Encounter (Signed)
left messages to call and schd follow up apt. No return call back. Sent letter 04/03/13

## 2013-05-05 ENCOUNTER — Ambulatory Visit (INDEPENDENT_AMBULATORY_CARE_PROVIDER_SITE_OTHER): Payer: BC Managed Care – PPO

## 2013-05-05 DIAGNOSIS — Z23 Encounter for immunization: Secondary | ICD-10-CM

## 2013-05-30 DIAGNOSIS — Z1211 Encounter for screening for malignant neoplasm of colon: Secondary | ICD-10-CM | POA: Diagnosis not present

## 2013-05-30 DIAGNOSIS — Z86010 Personal history of colonic polyps: Secondary | ICD-10-CM | POA: Diagnosis not present

## 2013-05-30 DIAGNOSIS — Z800 Family history of malignant neoplasm of digestive organs: Secondary | ICD-10-CM | POA: Diagnosis not present

## 2013-05-30 DIAGNOSIS — K5900 Constipation, unspecified: Secondary | ICD-10-CM | POA: Diagnosis not present

## 2013-06-05 ENCOUNTER — Ambulatory Visit (INDEPENDENT_AMBULATORY_CARE_PROVIDER_SITE_OTHER): Payer: Medicare Other | Admitting: Critical Care Medicine

## 2013-06-05 ENCOUNTER — Telehealth: Payer: Self-pay | Admitting: Family

## 2013-06-05 ENCOUNTER — Encounter: Payer: Self-pay | Admitting: Critical Care Medicine

## 2013-06-05 VITALS — BP 92/60 | HR 66 | Temp 97.5°F | Ht 64.0 in | Wt 110.0 lb

## 2013-06-05 DIAGNOSIS — J449 Chronic obstructive pulmonary disease, unspecified: Secondary | ICD-10-CM

## 2013-06-05 NOTE — Telephone Encounter (Signed)
SHE IS ON MEDICARE NOW AND WONDERS IF YOU STILL WANT HER TO HAVE A CARDIAC ECHO

## 2013-06-05 NOTE — Telephone Encounter (Signed)
Lets have her schedule a welcome to medicare visit. We will repeat EKG at that time and decide.

## 2013-06-05 NOTE — Assessment & Plan Note (Signed)
Gold stage a COPD markedly improved with smoking cessation Plan No indication for additional inhaled steroids No indication for additional inhaled bronchodilator therapy except for as needed short acting agonist Return as needed

## 2013-06-05 NOTE — Patient Instructions (Signed)
No further follow up Call if needed Stay off cigarettes

## 2013-06-05 NOTE — Progress Notes (Signed)
Subjective:    Patient ID: Renee Navarro, female    DOB: Apr 05, 1946, 67 y.o.   MRN: [id number]  HPI  67 y.o.F  06/05/2013 Chief Complaint  Patient presents with  . 6 month follow up    Did notice DOE when climbing stairs last week, but overall, breathing is doing well.  No wheezing, chest tightness, chest pain, or cough.    No smoking!!. Dyspnea is at baseline.  No cough or mucus. No recent URIs.  No chest pains. No f/c/s. Pt denies any significant sore throat, nasal congestion or excess secretions, fever, chills, sweats, unintended weight loss, pleurtic or exertional chest pain, orthopnea PND, or leg swelling Pt denies any increase in rescue therapy over baseline, denies waking up needing it or having any early am or nocturnal exacerbations of coughing/wheezing/or dyspnea. Pt also denies any obvious fluctuation in symptoms with  weather or environmental change or other alleviating or aggravating factors      Past Medical History  Diagnosis Date  . COPD (chronic obstructive pulmonary disease)   . Osteoporosis   . Tobacco user   . Dyslipidemia   . Lung nodule seen on imaging study      Family History  Problem Relation Age of Onset  . Cancer Father     colon  . Diabetes Sister   . Hypertension Sister   . Lymphoma Brother      History   Social History  . Marital Status: Married    Spouse Name: N/A    Number of Children: N/A  . Years of Education: N/A   Occupational History  . TEACHER     [employer]   Social History Main Topics  . Smoking status: Former Smoker -- 0.75 packs/day for 51 years    Types: Cigarettes    Quit date: 07/14/2012  . Smokeless tobacco: Not on file     Comment: Currently using patch.  . Alcohol Use: No  . Drug Use: No  . Sexual Activity: Not on file   Other Topics Concern  . Not on file   Social History Narrative   Caffeine Use: occasional   Regular exercise:  No           Allergies  Allergen Reactions  .  Budesonide-Formoterol Fumarate     REACTION: cough     Outpatient Prescriptions Prior to Visit  Medication Sig Dispense Refill  . aspirin 81 MG tablet Take 81 mg by mouth daily.        . Calcium Carbonate-Vitamin D (CALTRATE 600+D) 600-400 MG-UNIT per tablet Take 1 tablet by mouth daily.      Marland Kitchen PROAIR HFA 108 (90 BASE) MCG/ACT inhaler Take 108 mcg by mouth as needed.       No facility-administered medications prior to visit.      Review of Systems  Constitutional: Negative for fever, chills, diaphoresis, activity change, appetite change, fatigue and unexpected weight change.  HENT: Negative for congestion, dental problem, ear discharge, ear pain, facial swelling, hearing loss, mouth sores, nosebleeds, postnasal drip, rhinorrhea, sinus pressure, sneezing, sore throat, tinnitus, trouble swallowing and voice change.   Eyes: Negative for photophobia, discharge, itching and visual disturbance.  Respiratory: Positive for shortness of breath. Negative for apnea, cough, choking, chest tightness, wheezing and stridor.        Dyspnea with exertion only  Cardiovascular: Negative for chest pain, palpitations and leg swelling.  Gastrointestinal: Negative for nausea, vomiting, abdominal pain, constipation, blood in stool and abdominal distention.  Genitourinary:  Negative for dysuria, urgency, frequency, hematuria, flank pain, decreased urine volume and difficulty urinating.  Musculoskeletal: Negative for arthralgias, back pain, gait problem, joint swelling, myalgias, neck pain and neck stiffness.  Skin: Negative for color change, pallor and rash.  Neurological: Negative for dizziness, tremors, seizures, syncope, speech difficulty, weakness, light-headedness, numbness and headaches.  Hematological: Negative for adenopathy. Does not bruise/bleed easily.  Psychiatric/Behavioral: Negative for confusion, sleep disturbance and agitation. The patient is not nervous/anxious.        Objective:   Physical  Exam  Filed Vitals:   06/05/13 0934  BP: 92/60  Pulse: 66  Temp: 97.5 F (36.4 C)  TempSrc: Oral  Height: 5\' 4"  (1.626 m)  Weight: 110 lb (49.896 kg)  SpO2: 100%    Gen: Pleasant, well-nourished, in no distress,  normal affect  ENT: No lesions,  mouth clear,  oropharynx clear, no postnasal drip  Neck: No JVD, no TMG, no carotid bruits  Lungs: No use of accessory muscles, no dullness to percussion, distant BS   Cardiovascular: RRR, heart sounds normal, no murmur or gallops, no peripheral edema  Abdomen: soft and NT, no HSM,  BS normal  Musculoskeletal: No deformities, no cyanosis or clubbing  Neuro: alert, non focal  Skin: Warm, no lesions or rashes     Assessment & Plan:   Obstructive chronic bronchitis without exacerbation gold stage A COPD Gold stage a COPD markedly improved with smoking cessation Plan No indication for additional inhaled steroids No indication for additional inhaled bronchodilator therapy except for as needed short acting agonist Return as needed    Updated Medication List Outpatient Encounter Prescriptions as of 06/05/2013  Medication Sig  . aspirin 81 MG tablet Take 81 mg by mouth daily.    . Calcium Carbonate-Vitamin D (CALTRATE 600+D) 600-400 MG-UNIT per tablet Take 1 tablet by mouth daily.  Marland Kitchen PROAIR HFA 108 (90 BASE) MCG/ACT inhaler Take 108 mcg by mouth as needed.

## 2013-06-07 NOTE — Telephone Encounter (Signed)
Left message for pt to return my call.

## 2013-06-09 NOTE — Telephone Encounter (Signed)
Notified pt, CPE is scheduled for 07/03/13.

## 2013-07-03 ENCOUNTER — Ambulatory Visit (INDEPENDENT_AMBULATORY_CARE_PROVIDER_SITE_OTHER): Payer: Medicare Other | Admitting: Family

## 2013-07-03 ENCOUNTER — Encounter: Payer: Self-pay | Admitting: Family

## 2013-07-03 VITALS — BP 88/60 | HR 64 | Temp 97.3°F | Resp 16 | Ht 64.0 in | Wt 108.0 lb

## 2013-07-03 DIAGNOSIS — J441 Chronic obstructive pulmonary disease with (acute) exacerbation: Secondary | ICD-10-CM

## 2013-07-03 DIAGNOSIS — Z23 Encounter for immunization: Secondary | ICD-10-CM

## 2013-07-03 DIAGNOSIS — D582 Other hemoglobinopathies: Secondary | ICD-10-CM | POA: Diagnosis not present

## 2013-07-03 DIAGNOSIS — E559 Vitamin D deficiency, unspecified: Secondary | ICD-10-CM

## 2013-07-03 DIAGNOSIS — Z0000 Encounter for general adult medical examination without abnormal findings: Secondary | ICD-10-CM

## 2013-07-03 DIAGNOSIS — M810 Age-related osteoporosis without current pathological fracture: Secondary | ICD-10-CM

## 2013-07-03 DIAGNOSIS — J449 Chronic obstructive pulmonary disease, unspecified: Secondary | ICD-10-CM

## 2013-07-03 LAB — IRON AND TIBC
%SAT: 27 % (ref 20–55)
Iron: 99 ug/dL (ref 42–145)
UIBC: 270 ug/dL (ref 125–400)

## 2013-07-03 LAB — FERRITIN: Ferritin: 66 ng/mL (ref 10–291)

## 2013-07-03 MED ORDER — ERGOCALCIFEROL 1.25 MG (50000 UT) PO CAPS: 50000.0000 [IU] | ORAL_CAPSULE | ORAL | Status: DC

## 2013-07-03 NOTE — Progress Notes (Signed)
   Subjective:    Patient ID: Renee Navarro, female    DOB: 25-May-1946, 67 y.o.   MRN: [id number]  HPI  Subjective:   Patient here for Welcome to Medicare visit and management of other chronic and acute problems.  Osteoporosis- had leg pain with fosamax and discontinued.  Vitamin d deficiency- on caltrate +D.  COPD- has not needed proair x 1 year.    Immunizations: due for prevnar Diet: reports decent diet Exercise: active, no formal exercise. Plans to join gym now with silver sneakers.  Colonoscopy: schedule one for 1/30/5- this will be a 5 year follo wup Dexa: 2013- osteoporosis Pap Smear: 8/14- normal (Dr. Neva Seat) Mammogram: 8/14- normal     Hx of tobacco abuse-  Quit smoking 14 months ago! Cough is resolve.   Risk factors:   Roster of Physicians Providing Medical Care to Patient: Dr. Neva Seat- GYN Dr. Delford Field- pulmonary Dr. Loreta Ave- GI  Activities of Daily Living  In your present state of health, do you have any difficulty performing the following activities? Preparing food and eating?: No  Bathing yourself: No  Getting dressed: No  Using the toilet:No  Moving around from place to place: No  In the past year have you fallen or had a near fall?:No    Home Safety: Has smoke detector and wears seat belts. No firearms. No excess sun exposure.  Diet and Exercise  Current exercise habits: active, no formal exericse Dietary issues discussed: healthy diet   Depression Screen  (Note: if answer to either of the following is "Yes", then a more complete depression screening is indicated)  Q1: Over the past two weeks, have you felt down, depressed or hopeless?no  Q2: Over the past two weeks, have you felt little interest or pleasure in doing things? no   The following portions of the patient's history were reviewed and updated as appropriate: allergies, current medications, past family history, past medical history, past social history, past surgical history and problem list.    Objective:   Vision:declines- just had vision screen Hearing:  Able to hear forced whisper at 6 feet Body mass index: see nursing Cognitive Impairment Assessment: cognition, memory and judgment appear normal.   Assessment:   Medicare wellness utd on preventive parameters   Plan:    During the course of the visit the patient was educated and counseled about appropriate screening and preventive services including:       Fall prevention   Screening mammography  Bone densitometry screening  Diabetes screening  Nutrition counseling   Vaccines / LABS Prevnar today  Patient Instructions (the written plan) was given to the patient.       Review of Systems     Objective:   Physical Exam  Constitutional: She is oriented to person, place, and time. She appears well-developed and well-nourished. No distress.  HENT:  Head: Normocephalic and atraumatic.  Cardiovascular: Normal rate and regular rhythm.   No murmur heard. Pulmonary/Chest: Effort normal and breath sounds normal. No respiratory distress. She has no wheezes. She has no rales. She exhibits no tenderness.  Musculoskeletal: She exhibits no edema.  Lymphadenopathy:    She has no cervical adenopathy.  Neurological: She is alert and oriented to person, place, and time.  Skin: Skin is warm and dry.  Psychiatric: She has a normal mood and affect. Her behavior is normal. Judgment and thought content normal.          Assessment & Plan:

## 2013-07-03 NOTE — Patient Instructions (Signed)
Please complete your lab work prior to leaving. Start vitamin D once weekly. Return to lab in 3 months for vitamin D level.

## 2013-07-04 ENCOUNTER — Encounter: Payer: Self-pay | Admitting: Family

## 2013-07-06 NOTE — Assessment & Plan Note (Signed)
Continue caltrate.  Consider prolia.

## 2013-07-06 NOTE — Assessment & Plan Note (Signed)
Resume vitamin d.

## 2013-07-06 NOTE — Assessment & Plan Note (Signed)
Clinically improved now that she has quit smoking.

## 2013-07-07 ENCOUNTER — Telehealth: Payer: Self-pay | Admitting: *Deleted

## 2013-07-07 NOTE — Telephone Encounter (Signed)
OK to take citrucal with D.  Elevated hemoglobin is most likely related to COPD.

## 2013-07-07 NOTE — Telephone Encounter (Signed)
Notified pt and she voices understanding. 

## 2013-07-07 NOTE — Telephone Encounter (Signed)
Pt called requesting recent lab results.  Notified pt per lab letter of normal results. Pt wants to know if she should be concerned about elevated hemoglobin and what does it mean if recent results are normal?  Also states that the pharmacist told her not to take any other vitamin D as long as she is on the Rx strength, 50,000units weekly.  Pt is currently taking citracal D and wanted to verify this with [place]?  Please advise.

## 2013-09-08 ENCOUNTER — Ambulatory Visit (INDEPENDENT_AMBULATORY_CARE_PROVIDER_SITE_OTHER): Payer: Medicare Other | Admitting: Family Medicine

## 2013-09-08 ENCOUNTER — Encounter: Payer: Self-pay | Admitting: Family Medicine

## 2013-09-08 VITALS — BP 98/68 | HR 61 | Temp 97.7°F | Resp 12 | Ht 64.0 in | Wt 111.2 lb

## 2013-09-08 DIAGNOSIS — J029 Acute pharyngitis, unspecified: Secondary | ICD-10-CM

## 2013-09-08 DIAGNOSIS — Z2089 Contact with and (suspected) exposure to other communicable diseases: Secondary | ICD-10-CM

## 2013-09-08 DIAGNOSIS — Z20818 Contact with and (suspected) exposure to other bacterial communicable diseases: Secondary | ICD-10-CM

## 2013-09-08 LAB — POCT RAPID STREP A (OFFICE): Rapid Strep A Screen: NEGATIVE

## 2013-09-08 NOTE — Patient Instructions (Signed)
Consider a probiotic such as Digestive Advantage daily and/or zinc 50 mg daily during cold, flu season   Pharyngitis Pharyngitis is redness, pain, and swelling (inflammation) of your pharynx.  CAUSES  Pharyngitis is usually caused by infection. Most of the time, these infections are from viruses (viral) and are part of a cold. However, sometimes pharyngitis is caused by bacteria (bacterial). Pharyngitis can also be caused by allergies. Viral pharyngitis may be spread from person to person by coughing, sneezing, and personal items or utensils (cups, forks, spoons, toothbrushes). Bacterial pharyngitis may be spread from person to person by more intimate contact, such as kissing.  SIGNS AND SYMPTOMS  Symptoms of pharyngitis include:   Sore throat.   Tiredness (fatigue).   Low-grade fever.   Headache.  Joint pain and muscle aches.  Skin rashes.  Swollen lymph nodes.  Plaque-like film on throat or tonsils (often seen with bacterial pharyngitis). DIAGNOSIS  Your health care provider will ask you questions about your illness and your symptoms. Your medical history, along with a physical exam, is often all that is needed to diagnose pharyngitis. Sometimes, a rapid strep test is done. Other lab tests may also be done, depending on the suspected cause.  TREATMENT  Viral pharyngitis will usually get better in 3 4 days without the use of medicine. Bacterial pharyngitis is treated with medicines that kill germs (antibiotics).  HOME CARE INSTRUCTIONS   Drink enough water and fluids to keep your urine clear or pale yellow.   Only take over-the-counter or prescription medicines as directed by your health care provider:   If you are prescribed antibiotics, make sure you finish them even if you start to feel better.   Do not take aspirin.   Get lots of rest.   Gargle with 8 oz of salt water ( tsp of salt per 1 qt of water) as often as every 1 2 hours to soothe your throat.    Throat lozenges (if you are not at risk for choking) or sprays may be used to soothe your throat. SEEK MEDICAL CARE IF:   You have large, tender lumps in your neck.  You have a rash.  You cough up Ferreras, yellow-brown, or bloody spit. SEEK IMMEDIATE MEDICAL CARE IF:   Your neck becomes stiff.  You drool or are unable to swallow liquids.  You vomit or are unable to keep medicines or liquids down.  You have severe pain that does not go away with the use of recommended medicines.  You have trouble breathing (not caused by a stuffy nose). MAKE SURE YOU:   Understand these instructions.  Will watch your condition.  Will get help right away if you are not doing well or get worse. Document Released: 07/13/2005 Document Revised: 05/03/2013 Document Reviewed: 03/20/2013 [facility] Patient Information 2014 [place].

## 2013-09-08 NOTE — Progress Notes (Signed)
Pre visit review using our clinic review tool, if applicable. No additional management support is needed unless otherwise documented below in the visit note/SLS  

## 2013-09-10 NOTE — Assessment & Plan Note (Signed)
Has had strep exposure but her rapid strep was negative. Encouraged increased rest, hydration, probiotics and zinc. Report worsening symptoms

## 2013-09-10 NOTE — Progress Notes (Signed)
Patient ID: [name], female   DOB: January 14, 1946, 68 y.o.   MRN: [id number] Renee Navarro [id number] Oct 27, 1945 09/10/2013      Progress Note-Follow Up  Subjective  Chief Complaint  Chief Complaint  Patient presents with  . Nasal Congestion    Pt c/o head & chest congestion, post nasal drainage w/sore throat, cough w/yellow x1 mth  . Immunizations    Discuss Measles titier lab     HPI  Patient is a 68 year old Caucasian female who is in today concerned about a sore throat. She's recently had a strep exposure. She's not had fevers or chills. No headache and only mild fatigue is noted. No significant congestion, chest pain, palpitations or shortness of breath. No GI or GU concerns noted.  Past Medical History  Diagnosis Date  . COPD (chronic obstructive pulmonary disease)   . Osteoporosis   . Tobacco user   . Dyslipidemia   . Lung nodule seen on imaging study     Past Surgical History  Procedure Laterality Date  . Tubal ligation      Family History  Problem Relation Age of Onset  . Cancer Father     colon  . Diabetes Sister   . Hypertension Sister   . Lymphoma Brother     History   Social History  . Marital Status: Married    Spouse Name: N/A    Number of Children: N/A  . Years of Education: N/A   Occupational History  . TEACHER     [employer]   Social History Main Topics  . Smoking status: Former Smoker -- 0.75 packs/day for 51 years    Types: Cigarettes    Quit date: 07/14/2012  . Smokeless tobacco: Not on file     Comment: Currently using patch.  . Alcohol Use: No  . Drug Use: No  . Sexual Activity: Not on file   Other Topics Concern  . Not on file   Social History Narrative   Caffeine Use: occasional   Regular exercise:  No          Current Outpatient Prescriptions on File Prior to Visit  Medication Sig Dispense Refill  . aspirin 81 MG tablet Take 81 mg by mouth daily.        . Calcium Carbonate-Vitamin D (CALTRATE 600+D) 600-400  MG-UNIT per tablet Take 1 tablet by mouth daily.      Marland Kitchen PROAIR HFA 108 (90 BASE) MCG/ACT inhaler Take 108 mcg by mouth as needed.       No current facility-administered medications on file prior to visit.    Allergies  Allergen Reactions  . Budesonide-Formoterol Fumarate     REACTION: cough    Review of Systems  Review of Systems  Constitutional: Positive for malaise/fatigue. Negative for fever.  HENT: Positive for sore throat. Negative for congestion.   Eyes: Negative for discharge.  Respiratory: Negative for shortness of breath.   Cardiovascular: Negative for chest pain, palpitations and leg swelling.  Gastrointestinal: Negative for nausea, abdominal pain and diarrhea.  Genitourinary: Negative for dysuria.  Musculoskeletal: Negative for falls.  Skin: Negative for rash.  Neurological: Negative for loss of consciousness and headaches.  Endo/Heme/Allergies: Negative for polydipsia.  Psychiatric/Behavioral: Negative for depression and suicidal ideas. The patient is not nervous/anxious and does not have insomnia.     Objective  BP 98/68  Pulse 61  Temp(Src) 97.7 F (36.5 C) (Oral)  Resp 12  Ht 5\' 4"  (1.626 m)  Wt 111 lb  4 oz (50.463 kg)  BMI 19.09 kg/m2  SpO2 98%  Physical Exam  Physical Exam  Constitutional: She is oriented to person, place, and time and well-developed, well-nourished, and in no distress. No distress.  HENT:  Head: Normocephalic and atraumatic.  Oropharynx erytematous  Eyes: Conjunctivae are normal.  Neck: Neck supple. No thyromegaly present.  Cardiovascular: Normal rate, regular rhythm and normal heart sounds.   No murmur heard. Pulmonary/Chest: Effort normal and breath sounds normal. She has no wheezes.  Abdominal: She exhibits no distension and no mass.  Musculoskeletal: She exhibits no edema.  Lymphadenopathy:    She has no cervical adenopathy.  Neurological: She is alert and oriented to person, place, and time.  Skin: Skin is warm and dry.  No rash noted. She is not diaphoretic.  Psychiatric: Memory, affect and judgment normal.    Lab Results  Component Value Date   TSH 1.25 11/19/2008   Lab Results  Component Value Date   WBC 5.1 11/19/2008   HGB 15.6* 11/19/2008   HCT 45.4 11/19/2008   MCV 92.8 11/19/2008   PLT 211.0 11/19/2008   Lab Results  Component Value Date   CREATININE 0.76 11/05/2009   BUN 10 11/05/2009   NA 141 11/19/2008   K 3.9 11/19/2008   CL 107 11/19/2008   CO2 30 11/19/2008   Lab Results  Component Value Date   ALT 18 11/19/2008   AST 18 11/19/2008   ALKPHOS 91 11/19/2008   BILITOT 0.6 11/19/2008   Lab Results  Component Value Date   CHOL 247* 11/19/2008   Lab Results  Component Value Date   HDL 53.50 11/19/2008   Lab Results  Component Value Date   LDLCALC 124* 11/17/2007   Lab Results  Component Value Date   TRIG 115.0 11/19/2008   Lab Results  Component Value Date   CHOLHDL 5 11/19/2008     Assessment & Plan  Acute pharyngitis Has had strep exposure but her rapid strep was negative. Encouraged increased rest, hydration, probiotics and zinc. Report worsening symptoms

## 2013-10-02 ENCOUNTER — Ambulatory Visit: Payer: Medicare Other | Admitting: Family

## 2013-10-20 DIAGNOSIS — Z800 Family history of malignant neoplasm of digestive organs: Secondary | ICD-10-CM | POA: Diagnosis not present

## 2013-10-20 DIAGNOSIS — D126 Benign neoplasm of colon, unspecified: Secondary | ICD-10-CM | POA: Diagnosis not present

## 2013-10-20 DIAGNOSIS — Z1211 Encounter for screening for malignant neoplasm of colon: Secondary | ICD-10-CM | POA: Diagnosis not present

## 2013-10-20 DIAGNOSIS — Z86010 Personal history of colonic polyps: Secondary | ICD-10-CM | POA: Diagnosis not present

## 2013-11-10 DIAGNOSIS — H40009 Preglaucoma, unspecified, unspecified eye: Secondary | ICD-10-CM | POA: Diagnosis not present

## 2013-12-12 ENCOUNTER — Ambulatory Visit (INDEPENDENT_AMBULATORY_CARE_PROVIDER_SITE_OTHER): Payer: Medicare Other | Admitting: Family

## 2013-12-12 ENCOUNTER — Encounter: Payer: Self-pay | Admitting: Family

## 2013-12-12 VITALS — BP 100/70 | HR 75 | Temp 98.3°F | Resp 16 | Ht 64.0 in | Wt 110.0 lb

## 2013-12-12 DIAGNOSIS — S99919A Unspecified injury of unspecified ankle, initial encounter: Secondary | ICD-10-CM | POA: Diagnosis not present

## 2013-12-12 DIAGNOSIS — S8990XA Unspecified injury of unspecified lower leg, initial encounter: Secondary | ICD-10-CM | POA: Insufficient documentation

## 2013-12-12 DIAGNOSIS — S99929A Unspecified injury of unspecified foot, initial encounter: Secondary | ICD-10-CM

## 2013-12-12 NOTE — Progress Notes (Signed)
   Subjective:    Patient ID: Renee Navarro, female    DOB: 01-19-1946, 68 y.o.   MRN: [id number]  HPI  Renee Navarro is a 68 yr old female who presents today with chief complaint of knee pain. She reports that she slipped over the weekend on a piece of plastic which was over the carpet.  She fell onto her right elbow. Denies elbow pain.  The following day she developed some swelling of the left lateral knee. Denies knee pain. Notes that her knee has been "giving out" on occasion.    Review of Systems    see hpi  Past Medical History  Diagnosis Date  . COPD (chronic obstructive pulmonary disease)   . Osteoporosis   . Tobacco user   . Dyslipidemia   . Lung nodule seen on imaging study     History   Social History  . Marital Status: Married    Spouse Name: N/A    Number of Children: N/A  . Years of Education: N/A   Occupational History  . TEACHER     [employer]   Social History Main Topics  . Smoking status: Former Smoker -- 0.75 packs/day for 51 years    Types: Cigarettes    Quit date: 07/14/2012  . Smokeless tobacco: Not on file     Comment: Currently using patch.  . Alcohol Use: No  . Drug Use: No  . Sexual Activity: Not on file   Other Topics Concern  . Not on file   Social History Narrative   Caffeine Use: occasional   Regular exercise:  No          Past Surgical History  Procedure Laterality Date  . Tubal ligation      Family History  Problem Relation Age of Onset  . Cancer Father     colon  . Diabetes Sister   . Hypertension Sister   . Lymphoma Brother     Allergies  Allergen Reactions  . Budesonide-Formoterol Fumarate     REACTION: cough    Current Outpatient Prescriptions on File Prior to Visit  Medication Sig Dispense Refill  . aspirin 81 MG tablet Take 81 mg by mouth daily.        . Calcium Carbonate-Vitamin D (CALTRATE 600+D) 600-400 MG-UNIT per tablet Take 1 tablet by mouth daily.       No current facility-administered  medications on file prior to visit.    BP 100/70  Pulse 75  Temp(Src) 98.3 F (36.8 C) (Oral)  Resp 16  Ht 5\' 4"  (1.626 m)  Wt 110 lb (49.896 kg)  BMI 18.87 kg/m2  SpO2 97%      Objective:   Physical Exam  Constitutional: She is oriented to person, place, and time. She appears well-developed and well-nourished. No distress.  HENT:  Head: Normocephalic and atraumatic.  Musculoskeletal:  Left lateral knee- mild swelling, no tenderness.   Full ROM of the left knee- neg drawer test.     Neurological: She is alert and oriented to person, place, and time.  Skin: Skin is warm and dry.  Psychiatric: She has a normal mood and affect. Her behavior is normal. Judgment and thought content normal.          Assessment & Plan:

## 2013-12-12 NOTE — Assessment & Plan Note (Signed)
Recommended ice and soft knee brace.  If symptoms worsen or do not improve, consider MRI.  No obvious tendon injuries on exam.

## 2013-12-12 NOTE — Progress Notes (Signed)
Pre visit review using our clinic review tool, if applicable. No additional management support is needed unless otherwise documented below in the visit note. 

## 2013-12-12 NOTE — Patient Instructions (Addendum)
Ice your left knee as needed. Add vit D 3000 units once daily.

## 2013-12-14 DIAGNOSIS — D235 Other benign neoplasm of skin of trunk: Secondary | ICD-10-CM | POA: Diagnosis not present

## 2014-01-16 ENCOUNTER — Other Ambulatory Visit: Payer: Self-pay | Admitting: Obstetrics and Gynecology

## 2014-01-16 DIAGNOSIS — Z1231 Encounter for screening mammogram for malignant neoplasm of breast: Secondary | ICD-10-CM

## 2014-03-02 ENCOUNTER — Ambulatory Visit
Admission: RE | Admit: 2014-03-02 | Discharge: 2014-03-02 | Disposition: A | Discharge status: Home / Self Care | Payer: Medicare Other | Source: Ambulatory Visit | Attending: Obstetrics and Gynecology | Admitting: Obstetrics and Gynecology

## 2014-03-02 DIAGNOSIS — Z1231 Encounter for screening mammogram for malignant neoplasm of breast: Secondary | ICD-10-CM | POA: Diagnosis not present

## 2014-03-13 ENCOUNTER — Telehealth: Payer: Self-pay | Admitting: Family

## 2014-03-13 NOTE — Telephone Encounter (Signed)
Spoke with pt about shingles vaccination. LDM

## 2014-03-13 NOTE — Telephone Encounter (Signed)
Patient wants to know when was the last time that she had the shingles vaccine?

## 2014-05-01 DIAGNOSIS — M898X9 Other specified disorders of bone, unspecified site: Secondary | ICD-10-CM | POA: Diagnosis not present

## 2014-05-01 DIAGNOSIS — M898X2 Other specified disorders of bone, upper arm: Secondary | ICD-10-CM | POA: Diagnosis not present

## 2014-05-01 DIAGNOSIS — M948X2 Other specified disorders of cartilage, upper arm: Secondary | ICD-10-CM | POA: Diagnosis not present

## 2014-05-01 DIAGNOSIS — M948X9 Other specified disorders of cartilage, unspecified sites: Secondary | ICD-10-CM | POA: Diagnosis not present

## 2014-05-09 ENCOUNTER — Telehealth: Payer: Self-pay | Admitting: *Deleted

## 2014-05-09 ENCOUNTER — Ambulatory Visit (INDEPENDENT_AMBULATORY_CARE_PROVIDER_SITE_OTHER): Payer: Medicare Other | Admitting: *Deleted

## 2014-05-09 ENCOUNTER — Telehealth: Payer: Self-pay | Admitting: Family

## 2014-05-09 ENCOUNTER — Ambulatory Visit: Payer: Medicare Other

## 2014-05-09 ENCOUNTER — Other Ambulatory Visit: Payer: Self-pay | Admitting: *Deleted

## 2014-05-09 DIAGNOSIS — Z23 Encounter for immunization: Secondary | ICD-10-CM

## 2014-05-09 NOTE — Telephone Encounter (Signed)
Pt requesting rubella lab test . OK per Earlie Counts . Pt states will call to check the price of the lab test before proceeding. Pt to call us back to let us know if she still wants to proceed and schedule lab visit.

## 2014-05-09 NOTE — Telephone Encounter (Signed)
Caller name: [place] Relation to pt: self Call back number: [phone] Pharmacy:  Reason for call:   Patient would like the rubella IGG procedure(test) code? She states that she needs to call her insurance company to see if this is covered. Patient states that she will not be available later this afternoon, Friday is fine to call her. She also states that she was not offered the higher dose of flu shot and is very upset over this.

## 2014-05-11 NOTE — Telephone Encounter (Signed)
Spoke with pt and gave her CPT and test code # for rubella testing at [facility]. Pt states that she should have specified that she was wanting the high dose flu vaccine before receiving the vaccine as she would have preferred to wait for the high dose.

## 2014-08-28 DIAGNOSIS — N959 Unspecified menopausal and perimenopausal disorder: Secondary | ICD-10-CM | POA: Diagnosis not present

## 2014-08-28 DIAGNOSIS — N952 Postmenopausal atrophic vaginitis: Secondary | ICD-10-CM | POA: Diagnosis not present

## 2014-08-28 DIAGNOSIS — Z01419 Encounter for gynecological examination (general) (routine) without abnormal findings: Secondary | ICD-10-CM | POA: Diagnosis not present

## 2014-10-03 ENCOUNTER — Telehealth: Payer: Self-pay | Admitting: Critical Care Medicine

## 2014-10-03 DIAGNOSIS — Z87891 Personal history of nicotine dependence: Secondary | ICD-10-CM | POA: Diagnosis not present

## 2014-10-03 DIAGNOSIS — Z0000 Encounter for general adult medical examination without abnormal findings: Secondary | ICD-10-CM | POA: Diagnosis not present

## 2014-10-03 DIAGNOSIS — E785 Hyperlipidemia, unspecified: Secondary | ICD-10-CM | POA: Diagnosis not present

## 2014-10-03 DIAGNOSIS — J42 Unspecified chronic bronchitis: Secondary | ICD-10-CM

## 2014-10-03 HISTORY — DX: Unspecified chronic bronchitis: J42

## 2014-10-03 NOTE — Telephone Encounter (Signed)
Please have sarah groce review the pts chart to determine if she qualifies for low dose Ct Chest scrren

## 2014-10-03 NOTE — Telephone Encounter (Signed)
Per Dr. Bettina Gavia last Ov, patient does not need follow up.  Patient notified.    Patient says that she was a smoker for over 40 years and she read an article that says she should have a CT scan done periodically since she has smoked for so long.  She wants to know if Dr. Joya Gaskins recommends that she has a CT done and if so, how often should she have CT's done?    PW - please advise.

## 2014-10-05 ENCOUNTER — Telehealth: Payer: Self-pay | Admitting: Acute Care

## 2014-10-05 NOTE — Telephone Encounter (Signed)
I have printed off note and gave to Eric Form. She will look into this

## 2014-10-05 NOTE — Telephone Encounter (Signed)
Patient does qualify for LDCT screening for lung cancer based on her age, pack/year history( 32) , negative history of hemoptysis, no unexplained weight loss and quit date within last 3 years.I spoke with her about the Lung Cancer Screening program, and she wants to participate. I will call the week of 3/14 to get a shared decision and counseling meeting scheduled.She understands this is an annual screening program.

## 2014-10-11 NOTE — Telephone Encounter (Signed)
noted 

## 2014-10-11 NOTE — Telephone Encounter (Signed)
Spoke with [name] and she is currently in process of scheduling pt for CT. Pt is aware and has been updated per [name].   Will forward to PW to make aware.

## 2014-10-12 NOTE — Telephone Encounter (Signed)
Nothing further needed at this time. Will sign off.

## 2014-11-05 DIAGNOSIS — E559 Vitamin D deficiency, unspecified: Secondary | ICD-10-CM | POA: Diagnosis not present

## 2014-11-05 DIAGNOSIS — D72819 Decreased white blood cell count, unspecified: Secondary | ICD-10-CM | POA: Diagnosis not present

## 2014-11-09 DIAGNOSIS — H40021 Open angle with borderline findings, high risk, right eye: Secondary | ICD-10-CM | POA: Diagnosis not present

## 2014-11-12 ENCOUNTER — Telehealth: Payer: Self-pay | Admitting: Acute Care

## 2014-11-12 NOTE — Telephone Encounter (Signed)
Patient called with reminder of appointment 11/14/14 at 10 am. She verbalized understanding of time and location of appointment.

## 2014-11-13 ENCOUNTER — Ambulatory Visit: Payer: PRIVATE HEALTH INSURANCE | Admitting: Acute Care

## 2014-11-14 ENCOUNTER — Telehealth: Payer: Self-pay | Admitting: Acute Care

## 2014-11-14 ENCOUNTER — Ambulatory Visit (INDEPENDENT_AMBULATORY_CARE_PROVIDER_SITE_OTHER): Payer: Medicare Other | Admitting: Acute Care

## 2014-11-14 ENCOUNTER — Other Ambulatory Visit: Payer: Self-pay | Admitting: Acute Care

## 2014-11-14 DIAGNOSIS — Z87891 Personal history of nicotine dependence: Secondary | ICD-10-CM | POA: Diagnosis not present

## 2014-11-14 DIAGNOSIS — R911 Solitary pulmonary nodule: Secondary | ICD-10-CM

## 2014-11-14 NOTE — Telephone Encounter (Signed)
Patient called to make sure she had information for her LDCT scan appointment scheduled for this Friday 11/16/14 .Additionally, as follow up for her complaint of shortness of breath with exercise, I advised her to make an appointment with Dr. Joya Gaskins at the [facility] office for evaluation of possible need for  short acting inhalers prn exercise due to her COPD.

## 2014-11-14 NOTE — Progress Notes (Addendum)
Shared Decision Making Visit Lung Cancer Screening Program [phone])   Eligibility:  Age:  69 years old  Pack Years Smoking History Calculation:45 pack years  (# packs/per year x # years smoked)  Recent History of coughing up blood  no  Unexplained weight loss? no ( >Than 15 pounds within the last 6 months )  Prior History Lung / other cancer no (Diagnosis within the last 5 years already requiring surveillance chest CT Scans).  Smoking Status: Former Smoker  Former Smokers: Years since quit: 3 years  Quit Date: 04/14/2012  Visit Components:  Discussion included one or more decision making aids. yes  Discussion included risk/benefits of screening. yes  Discussion included potential follow up diagnostic testing for abnormal scans. yes  Discussion included meaning and risk of over diagnosis. yes  Discussion included meaning and risk of False Positives. yes  Discussion included meaning of total radiation exposure. yes  Counseling Included:  Importance of adherence to annual lung cancer LDCT screening. yes  Impact of comorbidities on ability to participate in the program. yes  Ability and willingness to under diagnostic treatment. yes  Smoking Cessation Counseling:  Current Smokers:   Discussed importance of smoking cessation. yes  Information about tobacco cessation classes and interventions provided to patient. yes  Patient provided with "ticket" for LDCT Scan. yes  Symptomatic Patient. no  Counseling:This patient is not symptomatic.This patient in a non-smoker.  Diagnosis Code: Tobacco Use Z72.0  Asymptomatic Patient : Yes  Counseling (Intermediate counseling: This patient is a non smoker and was reminded to remain smoke free.  Former Smokers:   Discussed the importance of maintaining cigarette abstinence. yes  Diagnosis Code: Personal History of Nicotine Dependence. V69.794  Information about tobacco cessation classes and interventions provided to  patient. Yes  Patient provided with "ticket" for LDCT Scan. yes  Written Order for Lung Cancer Screening with LDCT placed in Epic. Yes, CT scan will be ordered (CT Chest Lung Cancer Screening Low Dose W/O CM) [id number] Z12.2-Screening of respiratory organs Z87.891-Personal history of nicotine dependence   Magdalen Spatz, AGACNP-BC   This patient was seen by me in the office today. She verbalized understanding of the Lung Cancer Screening Program. She understands that this is an annual scan. I will schedule her for a CT this week or early next week.This was a 15 minute face to face encounter. The patient viewed a power point presentation and additionally was given a copy of the presentation to take home and read and refer to for any questions. She has my card and contact information and has been encouraged to call for any questions. I will call her with her CT appointment.She has her ticket to ride the [place] in hand with instructions to take it with her to the appointment.  There is no pre cert required for this scan. The [facility] team are calling the patient to schedule the scan.

## 2014-11-16 ENCOUNTER — Telehealth: Payer: Self-pay | Admitting: Acute Care

## 2014-11-16 ENCOUNTER — Ambulatory Visit (HOSPITAL_BASED_OUTPATIENT_CLINIC_OR_DEPARTMENT_OTHER)
Admission: RE | Admit: 2014-11-16 | Discharge: 2014-11-16 | Disposition: A | Discharge status: Home / Self Care | Payer: Medicare Other | Source: Ambulatory Visit | Attending: Acute Care | Admitting: Acute Care

## 2014-11-16 ENCOUNTER — Other Ambulatory Visit: Payer: Self-pay | Admitting: Acute Care

## 2014-11-16 DIAGNOSIS — Z720 Tobacco use: Secondary | ICD-10-CM | POA: Diagnosis not present

## 2014-11-16 DIAGNOSIS — R911 Solitary pulmonary nodule: Secondary | ICD-10-CM | POA: Diagnosis not present

## 2014-11-16 DIAGNOSIS — Z87891 Personal history of nicotine dependence: Secondary | ICD-10-CM | POA: Diagnosis not present

## 2014-11-16 NOTE — Telephone Encounter (Signed)
I called Renee Navarro to let her know that her LDCT was read and that per the Lung RADS guidelines she does not need to repeat the scan for 12 months. She verbalized understanding. We will contact her in a year to schedule her next annual scan.

## 2015-01-25 ENCOUNTER — Other Ambulatory Visit: Payer: Self-pay

## 2015-01-29 ENCOUNTER — Other Ambulatory Visit: Payer: Self-pay

## 2015-01-29 DIAGNOSIS — Z1231 Encounter for screening mammogram for malignant neoplasm of breast: Secondary | ICD-10-CM

## 2015-03-06 ENCOUNTER — Ambulatory Visit
Admission: RE | Admit: 2015-03-06 | Discharge: 2015-03-06 | Disposition: A | Discharge status: Home / Self Care | Payer: Medicare Other | Source: Ambulatory Visit

## 2015-03-06 DIAGNOSIS — Z1231 Encounter for screening mammogram for malignant neoplasm of breast: Secondary | ICD-10-CM

## 2015-05-30 DIAGNOSIS — Z23 Encounter for immunization: Secondary | ICD-10-CM | POA: Diagnosis not present

## 2015-09-16 DIAGNOSIS — N951 Menopausal and female climacteric states: Secondary | ICD-10-CM | POA: Diagnosis not present

## 2015-09-16 DIAGNOSIS — N905 Atrophy of vulva: Secondary | ICD-10-CM | POA: Diagnosis not present

## 2015-09-16 DIAGNOSIS — Z01419 Encounter for gynecological examination (general) (routine) without abnormal findings: Secondary | ICD-10-CM | POA: Diagnosis not present

## 2015-10-03 DIAGNOSIS — M810 Age-related osteoporosis without current pathological fracture: Secondary | ICD-10-CM | POA: Diagnosis not present

## 2015-10-03 DIAGNOSIS — Z0000 Encounter for general adult medical examination without abnormal findings: Secondary | ICD-10-CM | POA: Diagnosis not present

## 2015-10-03 DIAGNOSIS — J410 Simple chronic bronchitis: Secondary | ICD-10-CM | POA: Diagnosis not present

## 2015-10-03 DIAGNOSIS — Z13820 Encounter for screening for osteoporosis: Secondary | ICD-10-CM | POA: Diagnosis not present

## 2015-10-03 DIAGNOSIS — E559 Vitamin D deficiency, unspecified: Secondary | ICD-10-CM | POA: Diagnosis not present

## 2015-10-14 ENCOUNTER — Other Ambulatory Visit (HOSPITAL_BASED_OUTPATIENT_CLINIC_OR_DEPARTMENT_OTHER): Payer: Self-pay | Admitting: Internal Medicine

## 2015-10-14 DIAGNOSIS — Z780 Asymptomatic menopausal state: Secondary | ICD-10-CM

## 2015-10-15 ENCOUNTER — Other Ambulatory Visit: Payer: Self-pay | Admitting: Acute Care

## 2015-10-15 DIAGNOSIS — Z87891 Personal history of nicotine dependence: Secondary | ICD-10-CM

## 2015-10-18 ENCOUNTER — Ambulatory Visit (HOSPITAL_BASED_OUTPATIENT_CLINIC_OR_DEPARTMENT_OTHER)
Admission: RE | Admit: 2015-10-18 | Discharge: 2015-10-18 | Disposition: A | Discharge status: Home / Self Care | Payer: Medicare Other | Source: Ambulatory Visit | Attending: Internal Medicine | Admitting: Internal Medicine

## 2015-10-18 DIAGNOSIS — Z87891 Personal history of nicotine dependence: Secondary | ICD-10-CM | POA: Diagnosis not present

## 2015-10-18 DIAGNOSIS — M8580 Other specified disorders of bone density and structure, unspecified site: Secondary | ICD-10-CM | POA: Insufficient documentation

## 2015-10-18 DIAGNOSIS — Z780 Asymptomatic menopausal state: Secondary | ICD-10-CM | POA: Diagnosis present

## 2015-10-18 DIAGNOSIS — M8588 Other specified disorders of bone density and structure, other site: Secondary | ICD-10-CM | POA: Diagnosis not present

## 2015-11-18 ENCOUNTER — Other Ambulatory Visit: Payer: Self-pay | Admitting: Acute Care

## 2015-11-18 ENCOUNTER — Other Ambulatory Visit (HOSPITAL_BASED_OUTPATIENT_CLINIC_OR_DEPARTMENT_OTHER): Payer: Self-pay | Admitting: Obstetrics and Gynecology

## 2015-11-18 ENCOUNTER — Ambulatory Visit (HOSPITAL_BASED_OUTPATIENT_CLINIC_OR_DEPARTMENT_OTHER)
Admission: RE | Admit: 2015-11-18 | Discharge: 2015-11-18 | Disposition: A | Discharge status: Home / Self Care | Payer: Medicare Other | Source: Ambulatory Visit | Attending: Acute Care | Admitting: Acute Care

## 2015-11-18 DIAGNOSIS — I700 Atherosclerosis of aorta: Secondary | ICD-10-CM | POA: Diagnosis not present

## 2015-11-18 DIAGNOSIS — Z87891 Personal history of nicotine dependence: Secondary | ICD-10-CM

## 2015-11-18 DIAGNOSIS — Z122 Encounter for screening for malignant neoplasm of respiratory organs: Secondary | ICD-10-CM | POA: Insufficient documentation

## 2015-11-18 DIAGNOSIS — J439 Emphysema, unspecified: Secondary | ICD-10-CM | POA: Insufficient documentation

## 2015-11-18 DIAGNOSIS — I2510 Atherosclerotic heart disease of native coronary artery without angina pectoris: Secondary | ICD-10-CM | POA: Insufficient documentation

## 2015-11-18 DIAGNOSIS — Z1231 Encounter for screening mammogram for malignant neoplasm of breast: Secondary | ICD-10-CM

## 2015-11-19 ENCOUNTER — Telehealth: Payer: Self-pay | Admitting: Acute Care

## 2015-11-19 NOTE — Progress Notes (Signed)
Quick Note:  Spoke with pt regarding CT Chest results and recs. Please see phone msg from 11/19/15 for additional information. Results faxed to PCP; future CT order already placed. ______

## 2015-11-19 NOTE — Telephone Encounter (Signed)
Per [name], Low Dose CT is not concerning for lung ca and shows emphysema.  Please have repeat scan in 12 months.   -----  Spoke with pt.  Discussed above results and recs.  Pt verbalized understanding.  Results faxed to PCP.  Order for future CT has been placed.    Sarah, pt would like to know if the emphysema has worsened compared to last CT.  Please advise.  Thank you.

## 2015-11-20 ENCOUNTER — Telehealth: Payer: Self-pay | Admitting: Acute Care

## 2015-11-20 NOTE — Telephone Encounter (Signed)
Called, spoke with pt.  Discussed below per SG.  Pt verbalized understanding and requesting copy of recent CT results to be mailed to her verified home address.  Results mailed.  Pt aware and voiced no further questions or concerns at this time.

## 2015-11-20 NOTE — Telephone Encounter (Signed)
Please let Renee Navarro know that her CT's, 2016 and 2017, were both read as mild to moderate emphysema. No indication of worsening from one year to the next.  Thanks so much.

## 2015-11-20 NOTE — Telephone Encounter (Signed)
Called and spoke to pt. Pt states she received a call after she was informed of her results. Advised the pt that nothing further is needed. Pt verbalized understanding and denied any further questions or concerns at this time.

## 2015-11-20 NOTE — Telephone Encounter (Signed)
[phone], pt cb

## 2015-11-20 NOTE — Telephone Encounter (Signed)
LVM for pt to return call

## 2016-03-09 ENCOUNTER — Ambulatory Visit (HOSPITAL_BASED_OUTPATIENT_CLINIC_OR_DEPARTMENT_OTHER)
Admission: RE | Admit: 2016-03-09 | Discharge: 2016-03-09 | Disposition: A | Discharge status: Home / Self Care | Payer: Medicare Other | Source: Ambulatory Visit | Attending: Obstetrics and Gynecology | Admitting: Obstetrics and Gynecology

## 2016-03-09 DIAGNOSIS — Z1231 Encounter for screening mammogram for malignant neoplasm of breast: Secondary | ICD-10-CM | POA: Insufficient documentation

## 2016-05-14 ENCOUNTER — Telehealth: Payer: Self-pay | Admitting: Internal Medicine

## 2016-05-14 ENCOUNTER — Ambulatory Visit: Payer: Medicare Other

## 2016-05-14 NOTE — Telephone Encounter (Signed)
Patient came in to get flu shot with spouse. She is a patient of Idamae Schuller , she transferred her care to her from you in 2016. She is wanting to transfer back to our office and get her FLU shot. Can she come in to get a flu shot without having an appointment with you. She said she does not have any issues at this moment to be seen for.

## 2016-05-15 NOTE — Telephone Encounter (Signed)
Ok to schedule flu shot as long as she also schedules a follow up appointment in the near future.

## 2016-05-15 NOTE — Telephone Encounter (Signed)
Left patient a message to call back to get scheduled for flu shot and follow up appointment

## 2016-05-18 NOTE — Telephone Encounter (Signed)
Patient declined to schedule follow up appointment at this time and advised she would go to pharmacy to receive flu shot.

## 2016-05-20 DIAGNOSIS — Z23 Encounter for immunization: Secondary | ICD-10-CM | POA: Diagnosis not present

## 2016-07-17 ENCOUNTER — Ambulatory Visit (INDEPENDENT_AMBULATORY_CARE_PROVIDER_SITE_OTHER): Payer: Medicare Other | Admitting: Family

## 2016-07-17 ENCOUNTER — Encounter: Payer: Self-pay | Admitting: Family

## 2016-07-17 ENCOUNTER — Ambulatory Visit: Payer: Medicare Other | Admitting: Family

## 2016-07-17 VITALS — BP 127/86 | HR 70 | Temp 97.7°F | Resp 20 | Ht 64.0 in | Wt 111.8 lb

## 2016-07-17 DIAGNOSIS — J209 Acute bronchitis, unspecified: Secondary | ICD-10-CM

## 2016-07-17 MED ORDER — GUAIFENESIN-CODEINE 100-10 MG/5ML PO SYRP: 5.0000 mL | ORAL_SOLUTION | Freq: Three times a day (TID) | ORAL | 0 refills | Status: DC | PRN

## 2016-07-17 MED ORDER — AZITHROMYCIN 250 MG PO TABS: ORAL_TABLET | ORAL | 0 refills | Status: DC

## 2016-07-17 NOTE — Progress Notes (Signed)
Subjective:    Patient ID: Renee Navarro, female    DOB: 1946-05-11, 70 y.o.   MRN: [id number]  HPI  Ms. Register is a 70 yr old female who presents today with chief complaint of cough. Cough has been present for 5 days.  Has trouble sleeping at night due to cough. Denies associated fever. Energy is "pretty good."  Has some nasal congestion.    Review of Systems See HPI  Past Medical History:  Diagnosis Date  . COPD (chronic obstructive pulmonary disease) ([place])   . Dyslipidemia   . Lung nodule seen on imaging study   . Osteoporosis   . Tobacco user      Social History   Social History  . Marital status: Married    Spouse name: N/A  . Number of children: N/A  . Years of education: N/A   Occupational History  . TEACHER [employer]   Social History Main Topics  . Smoking status: Former Smoker    Packs/day: 0.75    Years: 51.00    Types: Cigarettes    Quit date: 07/14/2012  . Smokeless tobacco: Not on file     Comment: Currently using patch.  . Alcohol use No  . Drug use: No  . Sexual activity: Not on file   Other Topics Concern  . Not on file   Social History Narrative   Caffeine Use: occasional   Regular exercise:  No          Past Surgical History:  Procedure Laterality Date  . TUBAL LIGATION      Family History  Problem Relation Age of Onset  . Cancer Father     colon  . Diabetes Sister   . Hypertension Sister   . Lymphoma Brother     Allergies  Allergen Reactions  . Budesonide-Formoterol Fumarate     REACTION: cough    Current Outpatient Prescriptions on File Prior to Visit  Medication Sig Dispense Refill  . aspirin 81 MG tablet Take 81 mg by mouth daily.      . Calcium Carbonate-Vitamin D (CALTRATE 600+D) 600-400 MG-UNIT per tablet Take 1 tablet by mouth daily.     No current facility-administered medications on file prior to visit.     BP 127/86 (BP Location: Right Arm, Cuff Size: Normal)   Pulse 70   Temp 97.7 F  (36.5 C) (Oral)   Resp 20   Ht '5\' 4"'$  (1.626 m)   Wt 111 lb 12.8 oz (50.7 kg)   SpO2 97% Comment: room air  BMI 19.19 kg/m       Objective:   Physical Exam  Constitutional: She is oriented to person, place, and time. She appears well-developed and well-nourished.  HENT:  Head: Normocephalic and atraumatic.  Cardiovascular: Normal rate, regular rhythm and normal heart sounds.   No murmur heard. Pulmonary/Chest: Effort normal and breath sounds normal. No respiratory distress. She has no wheezes.  Neurological: She is alert and oriented to person, place, and time.  Psychiatric: She has a normal mood and affect. Her behavior is normal. Judgment and thought content normal.          Assessment & Plan:  Bronchitis- likely viral at this point. She is concerned about her symptoms worsening over the holiday weekend.  Advised pt that if symptoms are not improved in 2 days or if symptoms worsen to begin zpak. Rx has been sent to her pharmacy. Also gave rx for  cheratussin. She is advised that this may make her sleepy and to reserve for nighttime use.

## 2016-07-17 NOTE — Patient Instructions (Signed)
You may use cheratussin as needed for cough. If symptoms worsen or if not improved in 2 days- please begin azithromycin (antibiotic).

## 2016-07-17 NOTE — Progress Notes (Signed)
Pre visit review using our clinic review tool, if applicable. No additional management support is needed unless otherwise documented below in the visit note. 

## 2016-11-03 DIAGNOSIS — N905 Atrophy of vulva: Secondary | ICD-10-CM | POA: Diagnosis not present

## 2016-11-03 DIAGNOSIS — K640 First degree hemorrhoids: Secondary | ICD-10-CM | POA: Diagnosis not present

## 2016-11-03 DIAGNOSIS — Z01419 Encounter for gynecological examination (general) (routine) without abnormal findings: Secondary | ICD-10-CM | POA: Diagnosis not present

## 2016-11-03 DIAGNOSIS — K5900 Constipation, unspecified: Secondary | ICD-10-CM | POA: Diagnosis not present

## 2016-11-03 DIAGNOSIS — N952 Postmenopausal atrophic vaginitis: Secondary | ICD-10-CM | POA: Diagnosis not present

## 2016-11-12 DIAGNOSIS — Z86010 Personal history of colonic polyps: Secondary | ICD-10-CM | POA: Diagnosis not present

## 2016-11-12 DIAGNOSIS — Z800 Family history of malignant neoplasm of digestive organs: Secondary | ICD-10-CM | POA: Diagnosis not present

## 2016-11-12 DIAGNOSIS — K5901 Slow transit constipation: Secondary | ICD-10-CM | POA: Diagnosis not present

## 2016-11-12 DIAGNOSIS — K648 Other hemorrhoids: Secondary | ICD-10-CM | POA: Diagnosis not present

## 2016-11-18 ENCOUNTER — Ambulatory Visit (HOSPITAL_BASED_OUTPATIENT_CLINIC_OR_DEPARTMENT_OTHER)
Admission: RE | Admit: 2016-11-18 | Discharge: 2016-11-18 | Disposition: A | Discharge status: Home / Self Care | Payer: Medicare Other | Source: Ambulatory Visit | Attending: Acute Care | Admitting: Acute Care

## 2016-11-18 ENCOUNTER — Ambulatory Visit (HOSPITAL_BASED_OUTPATIENT_CLINIC_OR_DEPARTMENT_OTHER): Payer: Medicare Other

## 2016-11-18 DIAGNOSIS — I700 Atherosclerosis of aorta: Secondary | ICD-10-CM | POA: Insufficient documentation

## 2016-11-18 DIAGNOSIS — J439 Emphysema, unspecified: Secondary | ICD-10-CM | POA: Insufficient documentation

## 2016-11-18 DIAGNOSIS — Z87891 Personal history of nicotine dependence: Secondary | ICD-10-CM

## 2016-11-18 DIAGNOSIS — I2510 Atherosclerotic heart disease of native coronary artery without angina pectoris: Secondary | ICD-10-CM | POA: Diagnosis not present

## 2016-11-23 ENCOUNTER — Other Ambulatory Visit: Payer: Self-pay | Admitting: Acute Care

## 2016-11-23 DIAGNOSIS — Z87891 Personal history of nicotine dependence: Secondary | ICD-10-CM

## 2017-01-11 DIAGNOSIS — H40021 Open angle with borderline findings, high risk, right eye: Secondary | ICD-10-CM | POA: Diagnosis not present

## 2017-01-11 DIAGNOSIS — H04123 Dry eye syndrome of bilateral lacrimal glands: Secondary | ICD-10-CM | POA: Diagnosis not present

## 2017-03-08 ENCOUNTER — Other Ambulatory Visit (HOSPITAL_BASED_OUTPATIENT_CLINIC_OR_DEPARTMENT_OTHER): Payer: Self-pay | Admitting: Family Medicine

## 2017-03-08 DIAGNOSIS — Z1231 Encounter for screening mammogram for malignant neoplasm of breast: Secondary | ICD-10-CM

## 2017-03-09 ENCOUNTER — Ambulatory Visit (HOSPITAL_BASED_OUTPATIENT_CLINIC_OR_DEPARTMENT_OTHER)
Admission: RE | Admit: 2017-03-09 | Discharge: 2017-03-09 | Disposition: A | Discharge status: Home / Self Care | Payer: Medicare Other | Source: Ambulatory Visit | Attending: Family Medicine | Admitting: Family Medicine

## 2017-03-09 DIAGNOSIS — Z1231 Encounter for screening mammogram for malignant neoplasm of breast: Secondary | ICD-10-CM | POA: Diagnosis not present

## 2017-03-15 ENCOUNTER — Ambulatory Visit (HOSPITAL_BASED_OUTPATIENT_CLINIC_OR_DEPARTMENT_OTHER)
Admission: RE | Admit: 2017-03-15 | Discharge: 2017-03-15 | Disposition: A | Discharge status: Home / Self Care | Payer: Medicare Other | Source: Ambulatory Visit | Attending: Family | Admitting: Family

## 2017-03-15 ENCOUNTER — Encounter: Payer: Self-pay | Admitting: Family

## 2017-03-15 ENCOUNTER — Ambulatory Visit (INDEPENDENT_AMBULATORY_CARE_PROVIDER_SITE_OTHER): Payer: Medicare Other | Admitting: Family

## 2017-03-15 VITALS — BP 123/76 | HR 70 | Temp 98.0°F | Resp 16 | Ht 64.0 in | Wt 112.2 lb

## 2017-03-15 DIAGNOSIS — S9782XA Crushing injury of left foot, initial encounter: Secondary | ICD-10-CM | POA: Diagnosis not present

## 2017-03-15 DIAGNOSIS — X58XXXA Exposure to other specified factors, initial encounter: Secondary | ICD-10-CM | POA: Insufficient documentation

## 2017-03-15 DIAGNOSIS — M19072 Primary osteoarthritis, left ankle and foot: Secondary | ICD-10-CM | POA: Diagnosis not present

## 2017-03-15 DIAGNOSIS — S99922A Unspecified injury of left foot, initial encounter: Secondary | ICD-10-CM | POA: Diagnosis not present
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
## 2017-03-15 NOTE — Patient Instructions (Signed)
Please complete x-ray on the first floor.  

## 2017-05-11 ENCOUNTER — Ambulatory Visit: Payer: Medicare Other

## 2017-05-12 DIAGNOSIS — Z23 Encounter for immunization: Secondary | ICD-10-CM | POA: Diagnosis not present

## 2017-08-13 ENCOUNTER — Ambulatory Visit: Payer: Medicare Other | Admitting: *Deleted

## 2017-08-19 ENCOUNTER — Ambulatory Visit: Payer: Medicare Other | Admitting: *Deleted

## 2017-09-02 NOTE — Progress Notes (Signed)
Subjective:   Renee Navarro is a 72 y.o. female who presents for Medicare Annual (Subsequent) preventive examination. 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  The Patient was informed that the wellness visit is to identify future health risk and educate and initiate measures that can reduce risk for increased disease through the lifespan.   Describes health as fair, good or great? "wonderful"  Review of Systems: No ROS.  Medicare Wellness Visit. Additional risk factors are reflected in the social history. Cardiac Risk Factors include: advanced age (>37men, >51 women) Sleep patterns:   Sleeps 6-8 hrs. Feels rested. Home Safety/Smoke Alarms: Feels safe in home. Smoke alarms in place.  Living environment; residence and Firearm Safety: Lives with husband. 1 story home.  Seat Belt Safety/Bike Helmet: Wears seat belt.   Female:   Pap- Dr.White at [facility] yearly.  Normal.        Mammo-  Last 03/09/17: BI-RADS CATEGORY  1: Negative.     Dexa scan- last 10/18/15: osteopenia     CCS- last reported 01/16/08-3 polyps. Dr.Mann. Record request sent   Objective:     Vitals: BP 110/60 (BP Location: Left Arm, Patient Position: Sitting, Cuff Size: Normal)   Pulse 69   Ht 5\' 4"  (1.626 m)   Wt 110 lb 3.2 oz (50 kg)   SpO2 98%   BMI 18.92 kg/m   Body mass index is 18.92 kg/m.  Advanced Directives 09/06/2017  Does Patient Have a Medical Advance Directive? Yes  Type of Paramedic of [place];Living will  Does patient want to make changes to medical advance directive? No - Patient declined  Copy of [place] in Chart? No - copy requested    Tobacco Social History   Tobacco Use  Smoking Status Former Smoker  . Packs/day: 0.75  . Years: 51.00  . Pack years: 38.25  . Types: Cigarettes  . Last attempt to quit: 07/14/2012  . Years since quitting: 5.1  Smokeless Tobacco Never Used  Tobacco Comment   Currently using patch.     Counseling  given: Not Answered Comment: Currently using patch.   Clinical Intake: Pain : No/denies pain   Past Medical History:  Diagnosis Date  . COPD (chronic obstructive pulmonary disease) ([place])   . Dyslipidemia   . Lung nodule seen on imaging study   . Osteoporosis   . Tobacco user    Past Surgical History:  Procedure Laterality Date  . TUBAL LIGATION     Family History  Problem Relation Age of Onset  . Cancer Father        colon  . Diabetes Sister   . Hypertension Sister   . Lymphoma Brother    Social History   Socioeconomic History  . Marital status: Married    Spouse name: None  . Number of children: None  . Years of education: None  . Highest education level: None  Social Needs  . Financial resource strain: None  . Food insecurity - worry: None  . Food insecurity - inability: None  . Transportation needs - medical: None  . Transportation needs - non-medical: None  Occupational History  . Occupation: Product manager: [employer]    Comment: [employer]  Tobacco Use  . Smoking status: Former Smoker    Packs/day: 0.75    Years: 51.00    Pack years: 38.25    Types: Cigarettes    Last attempt to quit: 07/14/2012  Years since quitting: 5.1  . Smokeless tobacco: Never Used  . Tobacco comment: Currently using patch.  Substance and Sexual Activity  . Alcohol use: No    Frequency: Never  . Drug use: No  . Sexual activity: None  Other Topics Concern  . None  Social History Narrative   Caffeine Use: occasional   Regular exercise:  No          Outpatient Encounter Medications as of 09/06/2017  Medication Sig  . albuterol (PROAIR HFA) 108 (90 Base) MCG/ACT inhaler Inhale 2 puffs into the lungs every 4 (four) hours as needed.  Marland Kitchen aspirin 81 MG tablet Take 81 mg by mouth daily.    . [DISCONTINUED] Calcium Carbonate-Vitamin D (CALTRATE 600+D) 600-400 MG-UNIT per tablet Take 1 tablet by mouth daily.   No facility-administered encounter medications on file  as of 09/06/2017.     Activities of Daily Living In your present state of health, do you have any difficulty performing the following activities: 09/06/2017  Hearing? N  Vision? N  Comment wearing glasses.  eye doctor yearly.  Difficulty concentrating or making decisions? N  Walking or climbing stairs? N  Dressing or bathing? N  Doing errands, shopping? N  Preparing Food and eating ? N  Using the Toilet? N  In the past six months, have you accidently leaked urine? N  Do you have problems with loss of bowel control? N  Managing your Medications? N  Managing your Finances? N  Housekeeping or managing your Housekeeping? N  Some recent data might be hidden    Patient Care Team: Debbrah Alar, NP as PCP - General (Internal Medicine) Debbrah Alar, NP as Nurse Practitioner (Internal Medicine)    Assessment:   This is a routine wellness examination for Renee Navarro. Physical assessment deferred to PCP.  Exercise Activities and Dietary recommendations Current Exercise Habits: The patient does not participate in regular exercise at present, Exercise limited by: None identified   Diet (meal preparation, eat out, water intake, caffeinated beverages, dairy products, fruits and vegetables): Eats a lot of vegetables. Does not eat meat. Drinks a lot of coffee. No water. Bagels for breakfast. Yogurt for lunch.     Goals    . Maintain current health.  (pt-stated)       Fall Risk Fall Risk  09/06/2017 07/06/2013 07/03/2013  Falls in the past year? No No No    Depression Screen PHQ 2/9 Scores 09/06/2017 07/06/2013 07/03/2013  PHQ - 2 Score 0 0 0     Cognitive Function Ad8 score reviewed for issues:  Issues making decisions:no  Less interest in hobbies / activities:no  Repeats questions, stories (family complaining):no  Trouble using ordinary gadgets (microwave, computer, phone):no  Forgets the month or year: no  Mismanaging finances: no  Remembering appts:no  Daily  problems with thinking and/or memory:no Ad8 score is=0         Immunization History  Administered Date(s) Administered  . Hepatitis B 08/15/2008, 10/17/2008, 02/21/2009  . Influenza Split 05/08/2011, 05/13/2012  . Influenza Whole 05/08/2009, 05/09/2010  . Influenza,inj,Quad PF,6+ Mos 05/05/2013, 05/09/2014  . Influenza-Unspecified 05/20/2016  . Pneumococcal Conjugate-13 07/03/2013  . Pneumococcal Polysaccharide-23 04/19/2006, 07/14/2012  . Td 04/19/2006  . Tdap 02/25/2011  . Zoster 07/23/2008    Screening Tests Health Maintenance  Topic Date Due  . Hepatitis C Screening  03/21/1946  . PNA vac Low Risk Adult (2 of 2 - PPSV23) 07/14/2017  . COLONOSCOPY  01/15/2018  . MAMMOGRAM  03/10/2019  . TETANUS/TDAP  02/24/2021  . INFLUENZA VACCINE  Completed  . DEXA SCAN  Completed   Plan:    Follow up with Melissa NP as scheduled.  Continue to eat heart healthy diet (full of fruits, vegetables, whole grains, lean protein, water--limit salt, fat, and sugar intake) and increase physical activity as tolerated.  Continue doing brain stimulating activities (puzzles, reading, adult coloring books, staying active) to keep memory sharp.   Bring a copy of your living will and/or healthcare power of attorney to your next office visit.  I have personally reviewed and noted the following in the patient's chart:   . Medical and social history . Use of alcohol, tobacco or illicit drugs  . Current medications and supplements . Functional ability and status . Nutritional status . Physical activity . Advanced directives . List of other physicians . Hospitalizations, surgeries, and ER visits in previous 12 months . Vitals . Screenings to include cognitive, depression, and falls . Referrals and appointments  In addition, I have reviewed and discussed with patient certain preventive protocols, quality metrics, and best practice recommendations. A written personalized care plan for preventive  services as well as general preventive health recommendations were provided to patient.     Shela Nevin, [place]  09/06/2017

## 2017-09-06 ENCOUNTER — Encounter: Payer: Self-pay | Admitting: *Deleted

## 2017-09-06 ENCOUNTER — Ambulatory Visit (INDEPENDENT_AMBULATORY_CARE_PROVIDER_SITE_OTHER): Payer: Medicare Other | Admitting: *Deleted

## 2017-09-06 VITALS — BP 110/60 | HR 69 | Ht 64.0 in | Wt 110.2 lb

## 2017-09-06 DIAGNOSIS — Z0000 Encounter for general adult medical examination without abnormal findings: Secondary | ICD-10-CM | POA: Diagnosis not present

## 2017-09-06 NOTE — Progress Notes (Signed)
Reviewed and agree.

## 2017-09-06 NOTE — Patient Instructions (Signed)
Follow up with [facility] NP as scheduled.  Continue to eat heart healthy diet (full of fruits, vegetables, whole grains, lean protein, water--limit salt, fat, and sugar intake) and increase physical activity as tolerated.  Continue doing brain stimulating activities (puzzles, reading, adult coloring books, staying active) to keep memory sharp.   Bring a copy of your living will and/or healthcare power of attorney to your next office visit.    Renee Navarro , Thank you for taking time to come for your Medicare Wellness Visit. I appreciate your ongoing commitment to your health goals. Please review the following plan we discussed and let me know if I can assist you in the future.   These are the goals we discussed: Goals    . Maintain current health.  (pt-stated)       This is a list of the screening recommended for you and due dates:  Health Maintenance  Topic Date Due  .  Hepatitis C: One time screening is recommended by Center for Disease Control  (CDC) for  adults born from [birth year] through 1963.   05-Oct-1945  . Pneumonia vaccines (2 of 2 - PPSV23) 07/14/2017  . Colon Cancer Screening  01/15/2018  . Mammogram  03/10/2019  . Tetanus Vaccine  02/24/2021  . Flu Shot  Completed  . DEXA scan (bone density measurement)  Completed    Health Maintenance for Postmenopausal Women Menopause is a normal process in which your reproductive ability comes to an end. This process happens gradually over a span of months to years, usually between the ages of 14 and 43. Menopause is complete when you have missed 12 consecutive menstrual periods. It is important to talk with your health care provider about some of the most common conditions that affect postmenopausal women, such as heart disease, cancer, and bone loss (osteoporosis). Adopting a healthy lifestyle and getting preventive care can help to promote your health and wellness. Those actions can also lower your chances of developing some of these common  conditions. What should I know about menopause? During menopause, you may experience a number of symptoms, such as:  Moderate-to-severe hot flashes.  Night sweats.  Decrease in sex drive.  Mood swings.  Headaches.  Tiredness.  Irritability.  Memory problems.  Insomnia.  Choosing to treat or not to treat menopausal changes is an individual decision that you make with your health care provider. What should I know about hormone replacement therapy and supplements? Hormone therapy products are effective for treating symptoms that are associated with menopause, such as hot flashes and night sweats. Hormone replacement carries certain risks, especially as you become older. If you are thinking about using estrogen or estrogen with progestin treatments, discuss the benefits and risks with your health care provider. What should I know about heart disease and stroke? Heart disease, heart attack, and stroke become more likely as you age. This may be due, in part, to the hormonal changes that your body experiences during menopause. These can affect how your body processes dietary fats, triglycerides, and cholesterol. Heart attack and stroke are both medical emergencies. There are many things that you can do to help prevent heart disease and stroke:  Have your blood pressure checked at least every 1-2 years. High blood pressure causes heart disease and increases the risk of stroke.  If you are 61-1 years old, ask your health care provider if you should take aspirin to prevent a heart attack or a stroke.  Do not use any tobacco products, including cigarettes,  chewing tobacco, or electronic cigarettes. If you need help quitting, ask your health care provider.  It is important to eat a healthy diet and maintain a healthy weight. ? Be sure to include plenty of vegetables, fruits, low-fat dairy products, and lean protein. ? Avoid eating foods that are high in solid fats, added sugars, or salt  (sodium).  Get regular exercise. This is one of the most important things that you can do for your health. ? Try to exercise for at least 150 minutes each week. The type of exercise that you do should increase your heart rate and make you sweat. This is known as moderate-intensity exercise. ? Try to do strengthening exercises at least twice each week. Do these in addition to the moderate-intensity exercise.  Know your numbers.Ask your health care provider to check your cholesterol and your blood glucose. Continue to have your blood tested as directed by your health care provider.  What should I know about cancer screening? There are several types of cancer. Take the following steps to reduce your risk and to catch any cancer development as early as possible. Breast Cancer  Practice breast self-awareness. ? This means understanding how your breasts normally appear and feel. ? It also means doing regular breast self-exams. Let your health care provider know about any changes, no matter how small.  If you are 34 or older, have a clinician do a breast exam (clinical breast exam or CBE) every year. Depending on your age, family history, and medical history, it may be recommended that you also have a yearly breast X-ray (mammogram).  If you have a family history of breast cancer, talk with your health care provider about genetic screening.  If you are at high risk for breast cancer, talk with your health care provider about having an MRI and a mammogram every year.  Breast cancer (BRCA) gene test is recommended for women who have family members with BRCA-related cancers. Results of the assessment will determine the need for genetic counseling and BRCA1 and for BRCA2 testing. BRCA-related cancers include these types: ? Breast. This occurs in males or females. ? Ovarian. ? Tubal. This may also be called fallopian tube cancer. ? Cancer of the abdominal or pelvic lining (peritoneal  cancer). ? Prostate. ? Pancreatic.  Cervical, Uterine, and Ovarian Cancer Your health care provider may recommend that you be screened regularly for cancer of the pelvic organs. These include your ovaries, uterus, and vagina. This screening involves a pelvic exam, which includes checking for microscopic changes to the surface of your cervix (Pap test).  For women ages 21-65, health care providers may recommend a pelvic exam and a Pap test every three years. For women ages 29-65, they may recommend the Pap test and pelvic exam, combined with testing for human papilloma virus (HPV), every five years. Some types of HPV increase your risk of cervical cancer. Testing for HPV may also be done on women of any age who have unclear Pap test results.  Other health care providers may not recommend any screening for nonpregnant women who are considered low risk for pelvic cancer and have no symptoms. Ask your health care provider if a screening pelvic exam is right for you.  If you have had past treatment for cervical cancer or a condition that could lead to cancer, you need Pap tests and screening for cancer for at least 20 years after your treatment. If Pap tests have been discontinued for you, your risk factors (such as having  a new sexual partner) need to be reassessed to determine if you should start having screenings again. Some women have medical problems that increase the chance of getting cervical cancer. In these cases, your health care provider may recommend that you have screening and Pap tests more often.  If you have a family history of uterine cancer or ovarian cancer, talk with your health care provider about genetic screening.  If you have vaginal bleeding after reaching menopause, tell your health care provider.  There are currently no reliable tests available to screen for ovarian cancer.  Lung Cancer Lung cancer screening is recommended for adults 37-17 years old who are at high risk for  lung cancer because of a history of smoking. A yearly low-dose CT scan of the lungs is recommended if you:  Currently smoke.  Have a history of at least 30 pack-years of smoking and you currently smoke or have quit within the past 15 years. A pack-year is smoking an average of one pack of cigarettes per day for one year.  Yearly screening should:  Continue until it has been 15 years since you quit.  Stop if you develop a health problem that would prevent you from having lung cancer treatment.  Colorectal Cancer  This type of cancer can be detected and can often be prevented.  Routine colorectal cancer screening usually begins at age 48 and continues through age 35.  If you have risk factors for colon cancer, your health care provider may recommend that you be screened at an earlier age.  If you have a family history of colorectal cancer, talk with your health care provider about genetic screening.  Your health care provider may also recommend using home test kits to check for hidden blood in your stool.  A small camera at the end of a tube can be used to examine your colon directly (sigmoidoscopy or colonoscopy). This is done to check for the earliest forms of colorectal cancer.  Direct examination of the colon should be repeated every 5-10 years until age 62. However, if early forms of precancerous polyps or small growths are found or if you have a family history or genetic risk for colorectal cancer, you may need to be screened more often.  Skin Cancer  Check your skin from head to toe regularly.  Monitor any moles. Be sure to tell your health care provider: ? About any new moles or changes in moles, especially if there is a change in a mole's shape or color. ? If you have a mole that is larger than the size of a pencil eraser.  If any of your family members has a history of skin cancer, especially at a young age, talk with your health care provider about genetic  screening.  Always use sunscreen. Apply sunscreen liberally and repeatedly throughout the day.  Whenever you are outside, protect yourself by wearing long sleeves, pants, a wide-brimmed hat, and sunglasses.  What should I know about osteoporosis? Osteoporosis is a condition in which bone destruction happens more quickly than new bone creation. After menopause, you may be at an increased risk for osteoporosis. To help prevent osteoporosis or the bone fractures that can happen because of osteoporosis, the following is recommended:  If you are 90-62 years old, get at least 1,000 mg of calcium and at least 600 mg of vitamin D per day.  If you are older than age 39 but younger than age 20, get at least 1,200 mg of calcium and at  least 600 mg of vitamin D per day.  If you are older than age 46, get at least 1,200 mg of calcium and at least 800 mg of vitamin D per day.  Smoking and excessive alcohol intake increase the risk of osteoporosis. Eat foods that are rich in calcium and vitamin D, and do weight-bearing exercises several times each week as directed by your health care provider. What should I know about how menopause affects my mental health? Depression may occur at any age, but it is more common as you become older. Common symptoms of depression include:  Low or sad mood.  Changes in sleep patterns.  Changes in appetite or eating patterns.  Feeling an overall lack of motivation or enjoyment of activities that you previously enjoyed.  Frequent crying spells.  Talk with your health care provider if you think that you are experiencing depression. What should I know about immunizations? It is important that you get and maintain your immunizations. These include:  Tetanus, diphtheria, and pertussis (Tdap) booster vaccine.  Influenza every year before the flu season begins.  Pneumonia vaccine.  Shingles vaccine.  Your health care provider may also recommend other  immunizations. This information is not intended to replace advice given to you by your health care provider. Make sure you discuss any questions you have with your health care provider. Document Released: 09/04/2005 Document Revised: 01/31/2016 Document Reviewed: 04/16/2015 Elsevier Interactive Patient Education  2018 [employer].

## 2017-09-15 ENCOUNTER — Encounter: Payer: Self-pay | Admitting: Family

## 2017-09-15 ENCOUNTER — Ambulatory Visit: Payer: Medicare Other | Admitting: Family

## 2017-09-15 ENCOUNTER — Ambulatory Visit (INDEPENDENT_AMBULATORY_CARE_PROVIDER_SITE_OTHER): Payer: Medicare Other | Admitting: Family

## 2017-09-15 VITALS — BP 120/72 | HR 64 | Temp 97.6°F | Resp 16 | Ht 64.0 in

## 2017-09-15 DIAGNOSIS — J42 Unspecified chronic bronchitis: Secondary | ICD-10-CM | POA: Diagnosis not present

## 2017-09-15 MED ORDER — ALBUTEROL SULFATE HFA 108 (90 BASE) MCG/ACT IN AERS: 2.0000 | INHALATION_SPRAY | RESPIRATORY_TRACT | 5 refills | Status: DC | PRN

## 2017-09-15 NOTE — Progress Notes (Signed)
   Subjective:    Patient ID: Renee Navarro, female    DOB: 07-20-1946, 72 y.o.   MRN: [id number]  HPI  COPD- reports that she gets winded "a littlemore quickly with walking."  She has been on Symbicort in the past however this caused her cough.  Cost was also an issue.  She continues to have annual lung cancer screening. CTs through our pulmonary group.  She remains tobacco free.  She has no other concerns today.  She did recently have her Medicare wellness visit with the RN in our office.  Review of Systems     Objective:   Physical Exam  Constitutional: She is oriented to person, place, and time. She appears well-developed and well-nourished.  HENT:  Head: Normocephalic and atraumatic.  Cardiovascular: Normal rate, regular rhythm and normal heart sounds.  No murmur heard. Pulmonary/Chest: Effort normal and breath sounds normal. No respiratory distress. She has no wheezes.  Musculoskeletal: She exhibits no edema.  Neurological: She is alert and oriented to person, place, and time.  Psychiatric: She has a normal mood and affect. Her behavior is normal. Judgment and thought content normal.          Assessment & Plan:  COPD-she does have some worsening symptoms with activity.  We discussed trying another maintenance medication such as Advair.  However she is concerned about cost and does not wish to risk spending the money on something that may also cause cough.  She feels satisfied with use of an as needed albuterol inhaler.  Refill has been sent to her pharmacy.

## 2017-09-15 NOTE — Patient Instructions (Signed)
Follow up in 1 year for medicare wellness and follow up appointment.

## 2017-11-16 ENCOUNTER — Inpatient Hospital Stay (HOSPITAL_BASED_OUTPATIENT_CLINIC_OR_DEPARTMENT_OTHER): Admission: RE | Admit: 2017-11-16 | Payer: Medicare Other | Source: Ambulatory Visit

## 2017-11-19 ENCOUNTER — Ambulatory Visit (HOSPITAL_BASED_OUTPATIENT_CLINIC_OR_DEPARTMENT_OTHER): Payer: Medicare Other

## 2017-11-20 ENCOUNTER — Ambulatory Visit (HOSPITAL_BASED_OUTPATIENT_CLINIC_OR_DEPARTMENT_OTHER): Payer: Medicare Other

## 2017-11-20 ENCOUNTER — Ambulatory Visit (HOSPITAL_BASED_OUTPATIENT_CLINIC_OR_DEPARTMENT_OTHER)
Admission: RE | Admit: 2017-11-20 | Discharge: 2017-11-20 | Disposition: A | Discharge status: Home / Self Care | Payer: Medicare Other | Source: Ambulatory Visit | Attending: Acute Care | Admitting: Acute Care

## 2017-11-20 DIAGNOSIS — I700 Atherosclerosis of aorta: Secondary | ICD-10-CM | POA: Diagnosis not present

## 2017-11-20 DIAGNOSIS — I2510 Atherosclerotic heart disease of native coronary artery without angina pectoris: Secondary | ICD-10-CM | POA: Insufficient documentation

## 2017-11-20 DIAGNOSIS — J439 Emphysema, unspecified: Secondary | ICD-10-CM | POA: Insufficient documentation

## 2017-11-20 DIAGNOSIS — Z122 Encounter for screening for malignant neoplasm of respiratory organs: Secondary | ICD-10-CM | POA: Insufficient documentation

## 2017-11-20 DIAGNOSIS — Z87891 Personal history of nicotine dependence: Secondary | ICD-10-CM | POA: Insufficient documentation

## 2017-11-23 ENCOUNTER — Telehealth: Payer: Self-pay | Admitting: Acute Care

## 2017-11-23 DIAGNOSIS — Z122 Encounter for screening for malignant neoplasm of respiratory organs: Secondary | ICD-10-CM

## 2017-11-23 DIAGNOSIS — Z87891 Personal history of nicotine dependence: Secondary | ICD-10-CM

## 2017-11-23 NOTE — Telephone Encounter (Signed)
Renee Navarro, I spoke with pt and gave her results of LDCT if you want to note that in the result note.  I will place order for 1 yr f/u CT and fax results to PCP.

## 2017-11-25 ENCOUNTER — Telehealth: Payer: Self-pay | Admitting: Family

## 2017-11-25 DIAGNOSIS — E559 Vitamin D deficiency, unspecified: Secondary | ICD-10-CM

## 2017-11-25 DIAGNOSIS — E785 Hyperlipidemia, unspecified: Secondary | ICD-10-CM

## 2017-11-25 NOTE — Telephone Encounter (Signed)
Copied from [place] [id number]. Topic: Quick Communication - See Telephone Encounter >> Nov 25, 2017  8:52 AM Ahmed Prima L wrote: CRM for notification. See Telephone encounter for: 11/25/17.  Patient wants to know if she needs to get her measle vaccine. Please call @ [phone]

## 2017-11-26 NOTE — Telephone Encounter (Signed)
Notified pt of below and she is requesting to know what the cost would be if insurance doesn't pay. Message sent to lab, awaiting response. Advised pt I may not be able to get this answer today and she is ok with waiting until next week if needed.

## 2017-11-26 NOTE — Telephone Encounter (Signed)
Per note from the lab approximate cost is $345.33. Attempted to notify pt and left message for pt to return my call. [place] [facility] / Triage to discuss with pt and see how she would like to proceed.

## 2017-11-26 NOTE — Telephone Encounter (Signed)
She should have immunity based on her age, however only way to know for sure is to check MMR titer. This may not be covered by insurance, but I am happy to check if she wishes. If low we give a booster.  

## 2017-12-10 NOTE — Telephone Encounter (Signed)
OK to put in order for lipid panel, dx hyperlipidemia and vit D, dx vit D deficiency. I don't think we can get any other labs covered by medicare unless she has new symptoms. I did review her CT scan.

## 2017-12-10 NOTE — Telephone Encounter (Signed)
Notified pt and scheduled lab appt for 12/15/17 at 9:10am. Future orders entered.

## 2017-12-10 NOTE — Telephone Encounter (Signed)
Notified pt and she declines testing at this time.

## 2017-12-10 NOTE — Telephone Encounter (Signed)
Pt also states it has been several years since she had cholesterol testing. Just had wellness exam in February and did not have any labs at that visit. She also wants to make sure that you saw the chest CT from Eric Form, NP.

## 2017-12-15 ENCOUNTER — Other Ambulatory Visit (INDEPENDENT_AMBULATORY_CARE_PROVIDER_SITE_OTHER): Payer: Medicare Other

## 2017-12-15 DIAGNOSIS — E785 Hyperlipidemia, unspecified: Secondary | ICD-10-CM

## 2017-12-15 DIAGNOSIS — E559 Vitamin D deficiency, unspecified: Secondary | ICD-10-CM | POA: Diagnosis not present

## 2017-12-15 LAB — VITAMIN D 25 HYDROXY (VIT D DEFICIENCY, FRACTURES): VITD: 18.02 ng/mL — ABNORMAL LOW (ref 30.00–100.00)

## 2017-12-15 LAB — LIPID PANEL
Cholesterol: 243 mg/dL — ABNORMAL HIGH (ref 0–200)
HDL: 71.1 mg/dL (ref >39.00)
LDL Cholesterol: 151 mg/dL — ABNORMAL HIGH (ref 0–99)
NONHDL: 172.02
Total CHOL/HDL Ratio: 3
Triglycerides: 103 mg/dL (ref 0.0–149.0)
VLDL: 20.6 mg/dL (ref 0.0–40.0)

## 2017-12-16 ENCOUNTER — Other Ambulatory Visit: Payer: Self-pay | Admitting: Family

## 2017-12-16 NOTE — Telephone Encounter (Signed)
Vit d is low. I would like her to add vit d 50000 units once weekly x 12 weeks. Repeat vit D level in 12 weeks, dx vit d deficiency.

## 2017-12-16 NOTE — Telephone Encounter (Signed)
Also, cholesterol is elevated. Please work on low fat/low cholesterol diet.

## 2017-12-17 NOTE — Telephone Encounter (Signed)
Notified pt and she voices understanding. Pt states she doesn't want to try the Vitamin D. Took it in the past and caused severe constipation. Pt states she is in the sun every day and wants to know if there is anything else she could do to increased her Vitamin D level besides RX? Pt requests copy of labs be mailed to home address and they will be sent on Tuesday as mail has already run today and office is closed on Monday. Advised pt we will only call her back if PCP has additional recommendations regarding Vit D. Please advise?

## 2017-12-17 NOTE — Telephone Encounter (Signed)
She can drink vit d fortified milk.

## 2017-12-22 NOTE — Telephone Encounter (Signed)
Notified pt and she voices understanding. 

## 2017-12-27 ENCOUNTER — Telehealth: Payer: Self-pay | Admitting: *Deleted

## 2017-12-27 NOTE — Telephone Encounter (Signed)
Renee Navarro-- pt is scheduled for this with [facility] on 09/07/18.  Can you change to [facility] and notify the pt?  Copied from [place] [id number]. Topic: Appointment Scheduling - Prior Auth Required for Appointment >> Dec 27, 2017 12:19 PM Vernona Rieger wrote: Patient states she does not want to have her medicare wellness visit with the nurse, she wants to have it with Debbrah Alar. She said that Melissa told her before she could do it with her. Please advise. Call back @ [phone]

## 2017-12-27 NOTE — Telephone Encounter (Signed)
Called pt and LVM, letting her know, her appt has been rescheduled with Melissa rather than [facility]. The appt will be on the same day and at the same time. Advised to call back if she had any further questions.

## 2017-12-27 NOTE — Telephone Encounter (Signed)
OK to schedule wellness with me please.

## 2018-01-07 ENCOUNTER — Encounter: Payer: Self-pay | Admitting: Family

## 2018-01-31 ENCOUNTER — Other Ambulatory Visit (HOSPITAL_BASED_OUTPATIENT_CLINIC_OR_DEPARTMENT_OTHER): Payer: Self-pay | Admitting: Obstetrics and Gynecology

## 2018-01-31 DIAGNOSIS — Z1231 Encounter for screening mammogram for malignant neoplasm of breast: Secondary | ICD-10-CM

## 2018-03-12 ENCOUNTER — Ambulatory Visit (HOSPITAL_BASED_OUTPATIENT_CLINIC_OR_DEPARTMENT_OTHER): Payer: Medicare Other

## 2018-03-15 ENCOUNTER — Ambulatory Visit (HOSPITAL_BASED_OUTPATIENT_CLINIC_OR_DEPARTMENT_OTHER)
Admission: RE | Admit: 2018-03-15 | Discharge: 2018-03-15 | Disposition: A | Discharge status: Home / Self Care | Payer: Medicare Other | Source: Ambulatory Visit | Attending: Obstetrics and Gynecology | Admitting: Obstetrics and Gynecology

## 2018-03-15 ENCOUNTER — Ambulatory Visit (HOSPITAL_BASED_OUTPATIENT_CLINIC_OR_DEPARTMENT_OTHER): Payer: Medicare Other

## 2018-03-15 DIAGNOSIS — Z1231 Encounter for screening mammogram for malignant neoplasm of breast: Secondary | ICD-10-CM | POA: Diagnosis not present

## 2018-05-05 ENCOUNTER — Ambulatory Visit: Payer: Medicare Other | Admitting: Family

## 2018-05-05 DIAGNOSIS — Z23 Encounter for immunization: Secondary | ICD-10-CM

## 2018-05-18 ENCOUNTER — Telehealth: Payer: Self-pay

## 2018-05-18 ENCOUNTER — Ambulatory Visit (INDEPENDENT_AMBULATORY_CARE_PROVIDER_SITE_OTHER): Payer: Medicare Other

## 2018-05-18 ENCOUNTER — Encounter

## 2018-05-18 DIAGNOSIS — Z23 Encounter for immunization: Secondary | ICD-10-CM

## 2018-05-18 NOTE — Telephone Encounter (Signed)
Yes, she can have shingrix but needs to get it at her pharmacy for insurance to pay. Please ask her where she would like it sent (I have pended the rx). It is a 2 dose series. Second dose 2-6 months after first.

## 2018-05-18 NOTE — Telephone Encounter (Signed)
Informed patient that per Dr. Wylene Simmer her husband could get his Shingrix Vaccine. Advised I am still waiting for her provider to inform me about hers. Advised her to call and schedule with a nurse visit.

## 2018-05-18 NOTE — Addendum Note (Signed)
Addended by: Debbrah Alar on: 05/18/2018 05:21 PM   Modules accepted: Orders

## 2018-05-18 NOTE — Telephone Encounter (Signed)
Patient would like injections sent to [id number] - [place], [place]

## 2018-05-18 NOTE — Telephone Encounter (Signed)
Patient came in for her pneumonia immunization today states she received Shingles Vac in 2009. Wants to know if the can get Shingrix.vaccine

## 2018-05-19 MED ORDER — ZOSTER VAC RECOMB ADJUVANTED 50 MCG/0.5ML IM SUSR
INTRAMUSCULAR | 1 refills | Status: DC
Start: 2018-05-19 — End: 2018-05-19

## 2018-05-19 MED ORDER — ZOSTER VAC RECOMB ADJUVANTED 50 MCG/0.5ML IM SUSR: INTRAMUSCULAR | 1 refills | Status: DC

## 2018-05-19 NOTE — Addendum Note (Signed)
Addended by: Jiles Prows on: 05/19/2018 09:09 AM   Modules accepted: Orders

## 2018-05-19 NOTE — Telephone Encounter (Signed)
rx sign and sent to pharmacy.

## 2018-09-07 ENCOUNTER — Ambulatory Visit: Payer: Medicare Other | Admitting: Family

## 2018-09-09 ENCOUNTER — Encounter: Payer: Self-pay | Admitting: Family

## 2018-09-09 ENCOUNTER — Ambulatory Visit (INDEPENDENT_AMBULATORY_CARE_PROVIDER_SITE_OTHER): Payer: Medicare Other | Admitting: Family

## 2018-09-09 VITALS — BP 114/72 | HR 66 | Temp 98.0°F | Resp 16 | Ht 64.0 in | Wt 112.0 lb

## 2018-09-09 DIAGNOSIS — M810 Age-related osteoporosis without current pathological fracture: Secondary | ICD-10-CM | POA: Diagnosis not present

## 2018-09-09 DIAGNOSIS — Z0000 Encounter for general adult medical examination without abnormal findings: Secondary | ICD-10-CM | POA: Diagnosis not present

## 2018-09-09 DIAGNOSIS — J42 Unspecified chronic bronchitis: Secondary | ICD-10-CM | POA: Diagnosis not present

## 2018-09-09 DIAGNOSIS — Z860100 Personal history of colon polyps, unspecified: Secondary | ICD-10-CM

## 2018-09-09 DIAGNOSIS — R9431 Abnormal electrocardiogram [ECG] [EKG]: Secondary | ICD-10-CM

## 2018-09-09 DIAGNOSIS — R0602 Shortness of breath: Secondary | ICD-10-CM

## 2018-09-09 DIAGNOSIS — E559 Vitamin D deficiency, unspecified: Secondary | ICD-10-CM | POA: Diagnosis not present

## 2018-09-09 NOTE — Progress Notes (Signed)
Patient ID: Renee Navarro, female   DOB: 1945-11-24, 73 y.o.   MRN: [id number]   Subjective:    Renee Navarro is a 73 y.o. female who presents for Medicare Annual/Subsequent preventive examination.  Preventive Screening-Counseling & Management  Tobacco Social History   Tobacco Use  Smoking Status Former Smoker  . Packs/day: 0.75  . Years: 51.00  . Pack years: 38.25  . Types: Cigarettes  . Last attempt to quit: 07/14/2012  . Years since quitting: 6.1  Smokeless Tobacco Never Used  Tobacco Comment   Currently using patch.     Patient presents today for complete physical.  Immunizations: had shingrix x 2.   Diet: healthy Exercise: walks some Colonoscopy: 10/20/13 Dexa: 2017 Pap Smear: N/A Mammogram: 03/15/18  Problems Prior to Visit 1. COPD- notes some SOB wants to make sure her heart is ok.   2.  Osteoporosis- had been on fosamax previously but had leg pain.   3. Vitamin D deficiency- reports that she is not taking vit D or calcium because it causes her constipation.    Visual Acuity Screening   Right eye Left eye Both eyes  Without correction: 20/20 20/25 20/20   With correction:       Current Problems (verified) Patient Active Problem List   Diagnosis Date Noted  . Knee injury 12/12/2013  . Acute pharyngitis 09/10/2013  . Unspecified vitamin D deficiency 07/06/2013  . Lung nodule 02/29/2012  . General medical examination 05/10/2011  . OTHER DISORDERS OF BONE AND CARTILAGE OTHER 10/15/2009  . OSTEOPOROSIS 11/19/2008  . Former tobacco use 11/17/2007  . Obstructive chronic bronchitis without exacerbation gold stage A COPD 11/17/2007    Medications Prior to Visit Current Outpatient Medications on File Prior to Visit  Medication Sig Dispense Refill  . albuterol (PROAIR HFA) 108 (90 Base) MCG/ACT inhaler Inhale 2 puffs into the lungs every 4 (four) hours as needed. 1 Inhaler 5  . aspirin 81 MG tablet Take 81 mg by mouth daily.      Marland Kitchen Zoster Vaccine Adjuvanted  [facility]) injection Inject 0.5mg  IM now and again in 2-6 months. 0.5 mL 1   No current facility-administered medications on file prior to visit.     Current Medications (verified) Current Outpatient Medications  Medication Sig Dispense Refill  . albuterol (PROAIR HFA) 108 (90 Base) MCG/ACT inhaler Inhale 2 puffs into the lungs every 4 (four) hours as needed. 1 Inhaler 5  . aspirin 81 MG tablet Take 81 mg by mouth daily.      Marland Kitchen Zoster Vaccine Adjuvanted [facility]) injection Inject 0.5mg  IM now and again in 2-6 months. 0.5 mL 1   No current facility-administered medications for this visit.      Allergies (verified) Budesonide-formoterol fumarate   PAST HISTORY  Family History Family History  Problem Relation Age of Onset  . Cancer Father        colon  . Diabetes Sister   . Hypertension Sister   . Lymphoma Brother     Social History Social History   Tobacco Use  . Smoking status: Former Smoker    Packs/day: 0.75    Years: 51.00    Pack years: 38.25    Types: Cigarettes    Last attempt to quit: 07/14/2012    Years since quitting: 6.1  . Smokeless tobacco: Never Used  . Tobacco comment: Currently using patch.  Substance Use Topics  . Alcohol use: No    Frequency: Never     Are there smokers in your  home (other than you)? No  Risk Factors Current exercise habits: The patient has a physically strenuous job, but has no regular exercise apart from work.   Dietary issues discussed: reports fair diet   Cardiac risk factors: advanced age (older than 33 for men, 54 for women).  Depression Screen (Note: if answer to either of the following is "Yes", a more complete depression screening is indicated)   Over the past two weeks, have you felt down, depressed or hopeless? No  Over the past two weeks, have you felt little interest or pleasure in doing things? No  Have you lost interest or pleasure in daily life? No  Do you often feel hopeless? No  Do you cry easily over  simple problems? No  Activities of Daily Living In your present state of health, do you have any difficulty performing the following activities?:  Driving? No Managing money?  No Feeding yourself? No Getting from bed to chair? No . Climbing a flight of stairs? No Preparing food and eating?: No Bathing or showering? No Getting dressed: No Getting to the toilet? No Using the toilet:No Moving around from place to place: No In the past year have you fallen or had a near fall?:No   Are you sexually active?  No  Do you have more than one partner?  No  Hearing Difficulties: No Do you often ask people to speak up or repeat themselves? No Do you experience ringing or noises in your ears? No Do you have difficulty understanding soft or whispered voices? No   Do you feel that you have a problem with memory? No  Do you often misplace items? No  Do you feel safe at home?  Yes  Cognitive Testing  Alert? Yes  Normal Appearance?Yes  Oriented to person? Yes  Place? Yes   Time? Yes  Recall of three objects?  Yes  Can perform simple calculations? Yes  Displays appropriate judgment?Yes  Can read the correct time from a watch face?Yes   Advanced Directives have been discussed with the patient? Yes  List the Names of Other Physician/Practitioners you currently use: 1.    Indicate any recent Medical Services you may have received from other than [facility] providers in the past year (date may be approximate).  Immunization History  Administered Date(s) Administered  . Hepatitis B 08/15/2008, 10/17/2008, 02/21/2009  . Influenza Split 05/08/2011, 05/13/2012  . Influenza Whole 05/08/2009, 05/09/2010  . Influenza, High Dose Seasonal PF 05/18/2018  . Influenza,inj,Quad PF,6+ Mos 05/05/2013, 05/09/2014  . Influenza-Unspecified 05/20/2016  . Pneumococcal Conjugate-13 07/03/2013  . Pneumococcal Polysaccharide-23 04/19/2006, 07/14/2012, 05/18/2018  . Td 04/19/2006  . Tdap 02/25/2011  . Zoster  07/23/2008    Screening Tests Health Maintenance  Topic Date Due  . Hepatitis C Screening  01/02/1946  . INFLUENZA VACCINE  02/24/2018  . MAMMOGRAM  03/15/2020  . TETANUS/TDAP  02/24/2021  . COLONOSCOPY  10/21/2023  . DEXA SCAN  Completed  . PNA vac Low Risk Adult  Completed    All answers were reviewed with the patient and necessary referrals were made:  Nance Pear, NP   09/09/2018   History reviewed: allergies, current medications, past family history, past medical history, past social history, past surgical history and problem list  Review of Systems Pertinent items are noted in HPI.    Objective:     Body mass index is 19.22 kg/m. BP 114/72 (BP Location: Right Arm, Patient Position: Sitting, Cuff Size: Small)   Pulse 66  Temp 98 F (36.7 C) (Oral)   Resp 16   Ht 5\' 4"  (1.626 m)   Wt 112 lb (50.8 kg)   SpO2 100%   BMI 19.22 kg/m   Physical Exam  Constitutional: She is oriented to person, place, and time. She appears well-developed and well-nourished. No distress.  HENT:  Head: Normocephalic and atraumatic.  Right Ear: Tympanic membrane and ear canal normal.  Left Ear: Tympanic membrane and ear canal normal.  Mouth/Throat: Oropharynx is clear and moist.  Eyes: Pupils are equal, round, and reactive to light. No scleral icterus.  Neck: Normal range of motion. No thyromegaly present.  Cardiovascular: Normal rate and regular rhythm.   No murmur heard. Pulmonary/Chest: Effort normal and breath sounds normal. No respiratory distress. He has no wheezes. She has no rales. She exhibits no tenderness.  Abdominal: Soft. Bowel sounds are normal. She exhibits no distension and no mass. There is no tenderness. There is no rebound and no guarding.  Musculoskeletal: She exhibits no edema.  Lymphadenopathy:    She has no cervical adenopathy.  Neurological: She is alert and oriented to person, place, and time. She has normal patellar reflexes. She exhibits normal  muscle tone. Coordination normal.  Skin: Skin is warm and dry.  Psychiatric: She has a normal mood and affect. Her behavior is normal. Judgment and thought content normal.  Breasts/pelvis: deferred to [place]:        Assessment:  Vit D deficiency- check vit D level.   COPD/SOB- EKG is performed today. She no longer smokes which is great.  Osteoporosis- obtain follow up dexa   Abnormal EKG-EKG is personally reviewed. Sinus rhythm, notes Short PR, incomplete RBBB and LAFB. Will refer to cardiology for consultation and further recommendations.      Plan:     During the course of the visit the patient was educated and counseled about appropriate screening and preventive services including:    Advanced directives: has advanced directives, copy requested  Diet review for nutrition referral? Yes ____  Not Indicated x   Patient Instructions (the written plan) was given to the patient.  Medicare Attestation I have personally reviewed: The patient's medical and social history Their use of alcohol, tobacco or illicit drugs Their current medications and supplements The patient's functional ability including ADLs,fall risks, home safety risks, cognitive, and hearing and visual impairment Diet and physical activities Evidence for depression or mood disorders  The patient's weight, height, BMI, and visual acuity have been recorded in the chart.  I have made referrals, counseling, and provided education to the patient based on review of the above and I have provided the patient with a written personalized care plan for preventive services.     Nance Pear, NP   09/09/2018

## 2018-09-09 NOTE — Patient Instructions (Signed)
Please complete lab work prior to leaving. Schedule bone density on the first floor.

## 2018-09-12 ENCOUNTER — Other Ambulatory Visit (HOSPITAL_BASED_OUTPATIENT_CLINIC_OR_DEPARTMENT_OTHER): Payer: Medicare Other

## 2018-09-14 ENCOUNTER — Encounter: Payer: Self-pay | Admitting: Family

## 2018-09-14 LAB — VITAMIN D 1,25 DIHYDROXY
VITAMIN D 1, 25 (OH) TOTAL: 37 pg/mL (ref 18–72)
VITAMIN D3 1, 25 (OH): 37 pg/mL
Vitamin D2 1, 25 (OH)2: <8 pg/mL

## 2018-09-16 NOTE — Progress Notes (Signed)
Mailed out to pt

## 2018-09-19 ENCOUNTER — Ambulatory Visit (HOSPITAL_BASED_OUTPATIENT_CLINIC_OR_DEPARTMENT_OTHER): Payer: Medicare Other

## 2018-09-19 ENCOUNTER — Ambulatory Visit (HOSPITAL_BASED_OUTPATIENT_CLINIC_OR_DEPARTMENT_OTHER)
Admission: RE | Admit: 2018-09-19 | Discharge: 2018-09-19 | Disposition: A | Discharge status: Home / Self Care | Payer: Medicare Other | Source: Ambulatory Visit | Attending: Family | Admitting: Family

## 2018-09-19 DIAGNOSIS — M85832 Other specified disorders of bone density and structure, left forearm: Secondary | ICD-10-CM | POA: Diagnosis not present

## 2018-09-19 DIAGNOSIS — M810 Age-related osteoporosis without current pathological fracture: Secondary | ICD-10-CM | POA: Diagnosis not present

## 2018-09-20 ENCOUNTER — Encounter: Payer: Self-pay | Admitting: Family

## 2018-09-20 ENCOUNTER — Encounter: Payer: Self-pay | Admitting: *Deleted

## 2018-09-20 DIAGNOSIS — R9431 Abnormal electrocardiogram [ECG] [EKG]: Secondary | ICD-10-CM

## 2018-09-20 HISTORY — DX: Abnormal electrocardiogram (ECG) (EKG): R94.31

## 2018-09-20 NOTE — Progress Notes (Signed)
Cardiology Office Note:    Date:  09/22/2018   ID:  Renee Navarro, DOB [birth year], MRN [id number]  PCP:  Debbrah Alar, NP  Cardiologist:  Shirlee More, MD   Referring MD: Debbrah Alar, NP please do a yearly EKG and follow-up if she develops first-degree heart block or symptoms of near syncope or syncope she would need further evaluation.  She has good healthcare literacy understands her EKG abnormality and assured me she would follow-up with you and contact me if she has any concern  ASSESSMENT:    1. Abnormal EKG   2. RBBB (right bundle branch block with left anterior fascicular block)   3. Obstructive chronic bronchitis without exacerbation gold stage A COPD    PLAN:    In order of problems listed above:  1. She has a typical pattern of bifascicular heart block she is asymptomatic.  In general these individuals have about a 50% lifelong risk of needing a pacemaker and conversely 50% or greater opportunity not to need 1.  Yearly should she have an EKG performed and if she exhibits with first-degree heart block or what is referred shows trifascicular heart block would need further evaluation.  If she has syncope or near syncope would also prompt evaluation I asked her to call my office.  She has a good relationship with her primary care physician and told me she would follow-up and have her yearly EKG performed there and contact me if she had any questions or symptoms. 2. Stable she has a very good quality of life is vigorous and active and will continue her current treatment which has no interaction with her conduction system disease  Next appointment as needed   Medication Adjustments/Labs and Tests Ordered: Current medicines are reviewed at length with the patient today.  Concerns regarding medicines are outlined above.  No orders of the defined types were placed in this encounter.  No orders of the defined types were placed in this encounter.    Chief Complaint    Patient presents with  . Abnormal ECG    History of Present Illness:    Renee Navarro is a 73 y.o. female with COPD who is being seen today for the evaluation of an abnormal EKG done at wellness exam at the request of Debbrah Alar, NP.  EKG SRTH PR normal 120 msec incomplete RBBB and LAHB  Past Medical History:  Diagnosis Date  . COPD (chronic obstructive pulmonary disease) ([place])   . Dyslipidemia   . Lung nodule seen on imaging study   . Osteoporosis   . Tobacco user     Past Surgical History:  Procedure Laterality Date  . TUBAL LIGATION      Current Medications: Current Meds  Medication Sig  . albuterol (PROAIR HFA) 108 (90 Base) MCG/ACT inhaler Inhale 2 puffs into the lungs every 4 (four) hours as needed.  Marland Kitchen aspirin 81 MG tablet Take 81 mg by mouth daily.       Allergies:   Budesonide-formoterol fumarate   Social History   Socioeconomic History  . Marital status: Married    Spouse name: Not on file  . Number of children: Not on file  . Years of education: Not on file  . Highest education level: Not on file  Occupational History  . Occupation: Product manager: [employer]  Social Needs  . Financial resource strain: Not on file  . Food insecurity:  Worry: Not on file    Inability: Not on file  . Transportation needs:    Medical: Not on file    Non-medical: Not on file  Tobacco Use  . Smoking status: Former Smoker    Packs/day: 0.75    Years: 51.00    Pack years: 38.25    Types: Cigarettes    Last attempt to quit: 07/14/2012    Years since quitting: 6.1  . Smokeless tobacco: Never Used  Substance and Sexual Activity  . Alcohol use: Yes    Frequency: Never    Comment: occ glass of wine  . Drug use: No  . Sexual activity: Not on file  Lifestyle  . Physical activity:    Days per week: Not on file    Minutes per session: Not on file  . Stress: Not on file  Relationships  . Social connections:    Talks on  phone: Not on file    Gets together: Not on file    Attends religious service: Not on file    Active member of club or organization: Not on file    Attends meetings of clubs or organizations: Not on file    Relationship status: Not on file  Other Topics Concern  . Not on file  Social History Narrative   Caffeine Use: occasional   Regular exercise:  No           Family History: The patient's family history includes Cancer in her father; Diabetes in her sister; Hypertension in her sister; Lymphoma in her brother.  ROS:   Review of Systems  Constitution: Negative.  HENT: Negative.   Eyes: Negative.   Cardiovascular: Positive for dyspnea on exertion.  Respiratory: Positive for shortness of breath.   Endocrine: Negative.   Hematologic/Lymphatic: Negative.   Skin: Negative.   Musculoskeletal: Negative.   Gastrointestinal: Negative.   Genitourinary: Negative.   Neurological: Negative.   Psychiatric/Behavioral: Negative.   Allergic/Immunologic: Negative.    Please see the history of present illness.     All other systems reviewed and are negative.  EKGs/Labs/Other Studies Reviewed:    The following studies were reviewed today:   EKG:  EKG is independently reviewed from PCP office as described under history sinus rhythm bifascicular heart block personally reviewed Recent Labs: No results found for requested labs within last 8760 hours.  Recent Lipid Panel    Component Value Date/Time   CHOL 243 (H) 12/15/2017 0905   TRIG 103.0 12/15/2017 0905   HDL 71.10 12/15/2017 0905   CHOLHDL 3 12/15/2017 0905   VLDL 20.6 12/15/2017 0905   LDLCALC 151 (H) 12/15/2017 0905   LDLDIRECT 198.1 11/19/2008 1026    Physical Exam:    VS:  BP 134/84 (BP Location: Left Arm, Patient Position: Sitting, Cuff Size: Small)   Pulse 78   Ht 5\' 4"  (1.626 m)   Wt 111 lb (50.3 kg)   SpO2 98%   BMI 19.05 kg/m     Wt Readings from Last 3 Encounters:  09/22/18 111 lb (50.3 kg)  09/09/18 112 lb  (50.8 kg)  09/06/17 110 lb 3.2 oz (50 kg)     GEN:  Well nourished, well developed in no acute distress she does not look chronically ill or debilitated HEENT: Normal NECK: No JVD; No carotid bruits LYMPHATICS: No lymphadenopathy CARDIAC: RRR, no murmurs, rubs, gallops RESPIRATORY: Hyperinflated diffusely decreased breath sounds without rales, wheezing or rhonchi  ABDOMEN: Soft, non-tender, non-distended MUSCULOSKELETAL:  No edema; No deformity  SKIN:  Warm and dry NEUROLOGIC:  Alert and oriented x 3 PSYCHIATRIC:  Normal affect     Signed, Shirlee More, MD  09/22/2018 12:37 PM    [place]

## 2018-09-22 ENCOUNTER — Ambulatory Visit (INDEPENDENT_AMBULATORY_CARE_PROVIDER_SITE_OTHER): Payer: Medicare Other | Admitting: Cardiology

## 2018-09-22 ENCOUNTER — Encounter: Payer: Self-pay | Admitting: Cardiology

## 2018-09-22 ENCOUNTER — Telehealth: Payer: Self-pay | Admitting: Family

## 2018-09-22 VITALS — BP 134/84 | HR 78 | Ht 64.0 in | Wt 111.0 lb

## 2018-09-22 DIAGNOSIS — J449 Chronic obstructive pulmonary disease, unspecified: Secondary | ICD-10-CM | POA: Diagnosis not present

## 2018-09-22 DIAGNOSIS — I452 Bifascicular block: Secondary | ICD-10-CM

## 2018-09-22 DIAGNOSIS — R9431 Abnormal electrocardiogram [ECG] [EKG]: Secondary | ICD-10-CM

## 2018-09-22 HISTORY — DX: Bifascicular block: I45.2

## 2018-09-22 NOTE — Patient Instructions (Signed)
Medication Instructions:  Your physician recommends that you continue on your current medications as directed. Please refer to the Current Medication list given to you today.  If you need a refill on your cardiac medications before your next appointment, please call your pharmacy.   Lab work: None  If you have labs (blood work) drawn today and your tests are completely normal, you will receive your results only by: Marland Kitchen MyChart Message (if you have MyChart) OR . A paper copy in the mail If you have any lab test that is abnormal or we need to change your treatment, we will call you to review the results.  Testing/Procedures: None   Follow-Up: At [facility], you and your health needs are our priority.  As part of our continuing mission to provide you with exceptional heart care, we have created designated Provider Care Teams.  These Care Teams include your primary Cardiologist (physician) and Advanced Practice Providers (APPs -  Physician Assistants and Nurse Practitioners) who all work together to provide you with the care you need, when you need it. You will need a follow up appointment as needed if symptoms worsen or fail to improve.

## 2018-09-22 NOTE — Telephone Encounter (Signed)
Pt came in office stating that had a Bone Density done on Monday 09-19-2018 and that has not heard about her results. Pt would like someone to call her ASAP before 2:00 pm (pt instructed that does not want to be called after 2:00 any day- she is working after 2:00 and does not take calls after that hour) Pt tel [phone]. Please advise.

## 2018-09-23 ENCOUNTER — Telehealth: Payer: Self-pay | Admitting: Family

## 2018-09-23 NOTE — Telephone Encounter (Signed)
I was going to call the pt her bone density report however there was not a report found.    I spoke with the flow coordinator at [facility] and Debbrah Alar, NP has not reviewed it yet.   The office will call her once Melissa looks at the bone density results.

## 2018-09-23 NOTE — Telephone Encounter (Signed)
Reviewed results with patient. She verbalizes understanding.

## 2018-09-23 NOTE — Telephone Encounter (Signed)
Pt calling again to find out results of bone density scan.

## 2018-09-23 NOTE — Progress Notes (Signed)
Mailed out to  pt

## 2018-09-28 NOTE — Telephone Encounter (Signed)
Patient was confused about letter received, did not remember having blood drawn for vitamin d on the 14th. I told her she also had a letter sent out with her Bone density results and recommendations.

## 2018-09-28 NOTE — Telephone Encounter (Signed)
Pt called to check status. Pt would like a call back from the nurse. Pt states that she is confused on how she was tested for vitamin D. Please advise

## 2018-09-29 DIAGNOSIS — K5900 Constipation, unspecified: Secondary | ICD-10-CM | POA: Diagnosis not present

## 2018-09-29 DIAGNOSIS — Z800 Family history of malignant neoplasm of digestive organs: Secondary | ICD-10-CM | POA: Diagnosis not present

## 2018-09-29 DIAGNOSIS — Z1211 Encounter for screening for malignant neoplasm of colon: Secondary | ICD-10-CM | POA: Diagnosis not present

## 2018-09-29 DIAGNOSIS — Z86010 Personal history of colonic polyps: Secondary | ICD-10-CM | POA: Diagnosis not present

## 2019-01-04 ENCOUNTER — Other Ambulatory Visit (HOSPITAL_BASED_OUTPATIENT_CLINIC_OR_DEPARTMENT_OTHER): Payer: Self-pay | Admitting: Family

## 2019-01-04 DIAGNOSIS — Z1231 Encounter for screening mammogram for malignant neoplasm of breast: Secondary | ICD-10-CM

## 2019-01-06 ENCOUNTER — Other Ambulatory Visit: Payer: Self-pay

## 2019-01-06 ENCOUNTER — Ambulatory Visit (HOSPITAL_BASED_OUTPATIENT_CLINIC_OR_DEPARTMENT_OTHER)
Admission: RE | Admit: 2019-01-06 | Discharge: 2019-01-06 | Disposition: A | Discharge status: Home / Self Care | Payer: Medicare Other | Source: Ambulatory Visit | Attending: Acute Care | Admitting: Acute Care

## 2019-01-06 DIAGNOSIS — Z122 Encounter for screening for malignant neoplasm of respiratory organs: Secondary | ICD-10-CM | POA: Insufficient documentation

## 2019-01-06 DIAGNOSIS — Z87891 Personal history of nicotine dependence: Secondary | ICD-10-CM | POA: Insufficient documentation

## 2019-01-09 ENCOUNTER — Encounter (INDEPENDENT_AMBULATORY_CARE_PROVIDER_SITE_OTHER): Payer: Self-pay

## 2019-01-09 ENCOUNTER — Telehealth: Payer: Self-pay | Admitting: Acute Care

## 2019-01-09 ENCOUNTER — Other Ambulatory Visit (HOSPITAL_BASED_OUTPATIENT_CLINIC_OR_DEPARTMENT_OTHER): Payer: Self-pay | Admitting: Acute Care

## 2019-01-09 DIAGNOSIS — Z122 Encounter for screening for malignant neoplasm of respiratory organs: Secondary | ICD-10-CM

## 2019-01-09 DIAGNOSIS — Z87891 Personal history of nicotine dependence: Secondary | ICD-10-CM

## 2019-01-10 ENCOUNTER — Telehealth: Payer: Self-pay | Admitting: Family

## 2019-01-10 DIAGNOSIS — I2510 Atherosclerotic heart disease of native coronary artery without angina pectoris: Secondary | ICD-10-CM

## 2019-01-10 DIAGNOSIS — R9431 Abnormal electrocardiogram [ECG] [EKG]: Secondary | ICD-10-CM

## 2019-01-10 NOTE — Telephone Encounter (Signed)
Please sign off referral after you talk to patient.

## 2019-01-10 NOTE — Telephone Encounter (Signed)
I will refer to cardiology- see phone note

## 2019-01-10 NOTE — Telephone Encounter (Signed)
Please contact pt and let her know that I reviewed the CT scan that was done and her lungs look good but note was made of some calcification of the arteries in her heart.  Given that she had an abnormal ekg back in February as well I highly recommend that she see cardiology for consultation. I have pended cardiology consult

## 2019-01-10 NOTE — Telephone Encounter (Signed)
Spoke with pt and CT chest results. Pt understood. She had a question about her clavicle on CT. She stated she spoke to the tech about one side of her clavicle looked like it had a lump or was bigger than the other. I advised her that I would see if SG could look at the images because the radiologist made no notation of it but she said the tech marked the spot as are of interest. I will also send this message to Debbrah Alar to review the secondary finding of CAD. I mailed pt a coipy of her report. SG please advise.

## 2019-01-11 NOTE — Telephone Encounter (Signed)
Dr. Bettina Gavia- please see below:  FYI. Thanks

## 2019-01-11 NOTE — Telephone Encounter (Signed)
Thanks so much. I appreciate you making the referral.

## 2019-01-11 NOTE — Telephone Encounter (Signed)
Per patient she saw Dr. Bettina Gavia back in February after the abnormal ekg. He said he will see her again in one year after her next physical.

## 2019-01-12 NOTE — Telephone Encounter (Signed)
With the new CT finding she needs a visit, I feel comfortable virtual if she has a smart phone or internet access

## 2019-01-12 NOTE — Telephone Encounter (Signed)
Attempted to call patient. Left message for her to call back regarding need to make a virtual appointment.

## 2019-01-12 NOTE — Telephone Encounter (Signed)
Patient called back. Appointment made for July 1 st ,2020 at 8:05. States she has a CT yearly and this is not a new finding.

## 2019-01-24 NOTE — Progress Notes (Signed)
Virtual Visit via Telephone Note   This visit type was conducted due to national recommendations for restrictions regarding the COVID-19 Pandemic (e.g. social distancing) in an effort to limit this patient's exposure and mitigate transmission in our community.  Due to her co-morbid illnesses, this patient is at least at moderate risk for complications without adequate follow up.  This format is felt to be most appropriate for this patient at this time.  The patient did not have access to video technology/had technical difficulties with video requiring transitioning to audio format only (telephone).  All issues noted in this document were discussed and addressed.  No physical exam could be performed with this format.  Please refer to the patient's chart for her  consent to telehealth for [facility].   Date:  01/25/2019   ID:  Renee Navarro, DOB [birth year], MRN [id number]  Patient Location: Home Provider Location: Office  PCP:  Debbrah Alar, NP  Cardiologist:  Shirlee More, MD  Electrophysiologist:  None   Evaluation Performed:  Follow-Up Visit  Chief Complaint:  73 yo female presents for follow up after cardiac CT after referral by PCP Debbrah Alar NP.   History of Present Illness:    Renee Navarro is a 73 y.o. female with COPD, symptomatic bifascicular heart block (incomplete RBBB and LAHB) last seen by me 09/22/2018. Seen today at the request of her PCP Debbrah Alar NP after recent CT scan  01/06/19 for lung cancer screening which is done annually.   Most recent lipid panel 12/15/17 with LDL 151 not presently on statin therapy.   CT 01/06/19  FINDINGS: Cardiovascular: The heart size is normal. No substantial pericardial effusion. Coronary artery calcification is evident. Atherosclerotic calcification is noted in the wall of the thoracic aorta. IMPRESSION: 1. Lung-RADS 2, benign appearance or behavior. Continue annual screening with low-dose chest CT without contrast  in 12 months. 2.  Aortic Atherosclerois (ICD10-170.0) 3.  Emphysema. ([id number]-W65.9)  The patient does not have symptoms concerning for COVID-19 infection (fever, chills, cough, or new shortness of breath).   She is known to me originally from [place].  Despite her COPD she remains active she walks every day and she has stable exertional shortness of breath when she climbs hills.  She has had no chest pain palpitation or syncope.  She has high healthcare literacy I explained to her that coronary artery calcification by itself is not uncommon does not always mean obstructive CAD we discussed the consideration of a stress test and she does not think it is necessary at this time and I agree with her I did ask her to continue low-dose aspirin and after discussion of the benefits of statin therapy decided to put her on a low-dose of a low intensity statin and a follow-up lipid profile and CMP in 1 month.  If she develops typical anginal symptoms she would benefit from angiography either cardiac CTA or coronary left heart catheterization.  Make arrangements to see her in the office in February. Past Medical History:  Diagnosis Date  . COPD (chronic obstructive pulmonary disease) ([place])   . Dyslipidemia   . Lung nodule seen on imaging study   . Osteoporosis   . Tobacco user    Past Surgical History:  Procedure Laterality Date  . TUBAL LIGATION       Current Meds  Medication Sig  . albuterol (PROAIR HFA) 108 (90 Base) MCG/ACT inhaler Inhale 2 puffs into the lungs every 4 (four) hours as needed.  Marland Kitchen  aspirin 81 MG tablet Take 81 mg by mouth daily.       Allergies:   Budesonide-formoterol fumarate   Social History   Tobacco Use  . Smoking status: Former Smoker    Packs/day: 0.75    Years: 51.00    Pack years: 38.25    Types: Cigarettes    Quit date: 07/14/2012    Years since quitting: 6.5  . Smokeless tobacco: Never Used  Substance Use Topics  . Alcohol use: Yes    Frequency: Never     Comment: occ glass of wine  . Drug use: No     Family Hx: The patient's family history includes Cancer in her father; Diabetes in her sister; Hypertension in her sister; Lymphoma in her brother.  ROS:   Please see the history of present illness.     All other systems reviewed and are negative.   Prior CV studies:    Labs/Other Tests and Data Reviewed:    EKG:  An ECG dated 09/09/2018 was personally reviewed today and demonstrated:  [place] incomplete RBBB and LAFB  Recent Labs: No results found for requested labs within last 8760 hours.   Recent Lipid Panel Lab Results  Component Value Date/Time   CHOL 243 (H) 12/15/2017 09:05 AM   TRIG 103.0 12/15/2017 09:05 AM   HDL 71.10 12/15/2017 09:05 AM   CHOLHDL 3 12/15/2017 09:05 AM   LDLCALC 151 (H) 12/15/2017 09:05 AM   LDLDIRECT 198.1 11/19/2008 10:26 AM    Wt Readings from Last 3 Encounters:  01/25/19 110 lb (49.9 kg)  09/22/18 111 lb (50.3 kg)  09/09/18 112 lb (50.8 kg)     Objective:    Vital Signs:  Wt 110 lb (49.9 kg)   BMI 18.88 kg/m    VITAL SIGNS:  reviewed her mood and affect   ASSESSMENT & PLAN:    1. Coronary artery calcification, this is an additional risk factor she is not having anginal symptoms but will continue aspirin initiate statin therapy.  I will plan to see back in the office in follow-up and if she were to develop typical angina would benefit from an ischemia evaluation 2. Aortic atherosclerosis see above not uncommon in CT scan 3. COPD stable encourage her to continue her walking program 4. Right bundle branch block left anterior fascicular block will check EKG next visit 5. Encouraged her to continue using a mask social distancing with COVID-19  COVID-19 Education: The signs and symptoms of COVID-19 were discussed with the patient and how to seek care for testing (follow up with PCP or arrange E-visit).  The importance of social distancing was discussed today.  Time:   Today, I have spent  20 minutes with the patient with telehealth technology discussing the above problems.     Medication Adjustments/Labs and Tests Ordered: Current medicines are reviewed at length with the patient today.  Concerns regarding medicines are outlined above.   Tests Ordered: Orders Placed This Encounter  Procedures  . Lipid Profile  . Comprehensive Metabolic Panel (CMET)    Medication Changes: Meds ordered this encounter  Medications  . pravastatin (PRAVACHOL) 20 MG tablet    Sig: Take 1 tablet (20 mg total) by mouth every evening.    Dispense:  90 tablet    Refill:  1    Follow Up:  In Person February 2020  Signed, Shirlee More, MD  01/25/2019 9:35 AM    [facility]

## 2019-01-25 ENCOUNTER — Telehealth (INDEPENDENT_AMBULATORY_CARE_PROVIDER_SITE_OTHER): Payer: Medicare Other | Admitting: Cardiology

## 2019-01-25 ENCOUNTER — Other Ambulatory Visit: Payer: Self-pay

## 2019-01-25 ENCOUNTER — Encounter: Payer: Self-pay | Admitting: Cardiology

## 2019-01-25 VITALS — Wt 110.0 lb

## 2019-01-25 DIAGNOSIS — I2510 Atherosclerotic heart disease of native coronary artery without angina pectoris: Secondary | ICD-10-CM | POA: Diagnosis not present

## 2019-01-25 DIAGNOSIS — I2584 Coronary atherosclerosis due to calcified coronary lesion: Secondary | ICD-10-CM

## 2019-01-25 DIAGNOSIS — J449 Chronic obstructive pulmonary disease, unspecified: Secondary | ICD-10-CM

## 2019-01-25 DIAGNOSIS — I700 Atherosclerosis of aorta: Secondary | ICD-10-CM | POA: Diagnosis not present

## 2019-01-25 DIAGNOSIS — I452 Bifascicular block: Secondary | ICD-10-CM | POA: Diagnosis not present

## 2019-01-25 DIAGNOSIS — Z7189 Other specified counseling: Secondary | ICD-10-CM | POA: Diagnosis not present

## 2019-01-25 DIAGNOSIS — J439 Emphysema, unspecified: Secondary | ICD-10-CM

## 2019-01-25 MED ORDER — PRAVASTATIN SODIUM 20 MG PO TABS: 20.0000 mg | ORAL_TABLET | Freq: Every evening | ORAL | 1 refills | Status: DC

## 2019-01-25 NOTE — Patient Instructions (Signed)
Medication Instructions:  Your physician has recommended you make the following change in your medication:  START pravastatin (pravachol) 20 mg: Take 1 tablet daily in the evening  If you need a refill on your cardiac medications before your next appointment, please call your pharmacy.   Lab work: Your physician recommends that you return for lab work in 1 month: lipid panel, CMP. Please come to our [facility] office for lab work, no appointment needed. Please fast beforehand.   If you have labs (blood work) drawn today and your tests are completely normal, you will receive your results only by: Marland Kitchen MyChart Message (if you have MyChart) OR . A paper copy in the mail If you have any lab test that is abnormal or we need to change your treatment, we will call you to review the results.  Testing/Procedures: None  Follow-Up: At [facility], you and your health needs are our priority.  As part of our continuing mission to provide you with exceptional heart care, we have created designated Provider Care Teams.  These Care Teams include your primary Cardiologist (physician) and Advanced Practice Providers (APPs -  Physician Assistants and Nurse Practitioners) who all work together to provide you with the care you need, when you need it. You will need a follow up appointment in 7 months.  Please call our office 2 months in advance to schedule this appointment.      Pravastatin tablets What is this medicine? PRAVASTATIN (PRA va stat in) is known as a HMG-CoA reductase inhibitor or 'statin'. It lowers the level of cholesterol and triglycerides in the blood. This drug may also reduce the risk of heart attack, stroke, or other health problems in patients with risk factors for heart disease. Diet and lifestyle changes are often used with this drug. This medicine may be used for other purposes; ask your health care provider or pharmacist if you have questions. COMMON BRAND NAME(S): Pravachol What  should I tell my health care provider before I take this medicine? They need to know if you have any of these conditions:  diabetes  if you often drink alcohol  history of stroke  kidney disease  liver disease  muscle aches or weakness  thyroid disease  an unusual or allergic reaction to pravastatin, other medicines, foods, dyes, or preservatives  pregnant or trying to get pregnant  breast-feeding How should I use this medicine? Take pravastatin tablets by mouth. Swallow the tablets with a drink of water. Pravastatin can be taken at anytime of the day, with or without food. Follow the directions on the prescription label. Take your doses at regular intervals. Do not take your medicine more often than directed. Talk to your pediatrician regarding the use of this medicine in children. Special care may be needed. Pravastatin has been used in children as young as 45 years of age. Overdosage: If you think you have taken too much of this medicine contact a poison control center or emergency room at once. NOTE: This medicine is only for you. Do not share this medicine with others. What if I miss a dose? If you miss a dose, take it as soon as you can. If it is almost time for your next dose, take only that dose. Do not take double or extra doses. What may interact with this medicine? This medicine may interact with the following medications:  colchicine  cyclosporine  other medicines for high cholesterol  some antibiotics like azithromycin, clarithromycin, erythromycin, and telithromycin This list may  not describe all possible interactions. Give your health care provider a list of all the medicines, herbs, non-prescription drugs, or dietary supplements you use. Also tell them if you smoke, drink alcohol, or use illegal drugs. Some items may interact with your medicine. What should I watch for while using this medicine? Visit your doctor or health care professional for regular  check-ups. You may need regular tests to make sure your liver is working properly. Your health care professional may tell you to stop taking this medicine if you develop muscle problems. If your muscle problems do not go away after stopping this medicine, contact your health care professional. Do not become pregnant while taking this medicine. Women should inform their health care professional if they wish to become pregnant or think they might be pregnant. There is a potential for serious side effects to an unborn child. Talk to your health care professional or pharmacist for more information. Do not breast-feed an infant while taking this medicine. This medicine may affect blood sugar levels. If you have diabetes, check with your doctor or health care professional before you change your diet or the dose of your diabetic medicine. If you are going to need surgery or other procedure, tell your doctor that you are using this medicine. This drug is only part of a total heart-health program. Your doctor or a dietician can suggest a low-cholesterol and low-fat diet to help. Avoid alcohol and smoking, and keep a proper exercise schedule. This medicine may cause a decrease in Co-Enzyme Q-10. You should make sure that you get enough Co-Enzyme Q-10 while you are taking this medicine. Discuss the foods you eat and the vitamins you take with your health care professional. What side effects may I notice from receiving this medicine? Side effects that you should report to your doctor or health care professional as soon as possible:  allergic reactions like skin rash, itching or hives, swelling of the face, lips, or tongue  dark urine  fever  muscle pain, cramps, or weakness  redness, blistering, peeling or loosening of the skin, including inside the mouth  trouble passing urine or change in the amount of urine  unusually weak or tired  yellowing of the eyes or skin Side effects that usually do not  require medical attention (report to your doctor or health care professional if they continue or are bothersome):  gas  headache  heartburn  indigestion  stomach pain This list may not describe all possible side effects. Call your doctor for medical advice about side effects. You may report side effects to FDA at 1-800-FDA-1088. Where should I keep my medicine? Keep out of the reach of children. Store at room temperature between 15 to 30 degrees C (59 to 86 degrees F). Protect from light. Keep container tightly closed. Throw away any unused medicine after the expiration date. NOTE: This sheet is a summary. It may not cover all possible information. If you have questions about this medicine, talk to your doctor, pharmacist, or health care provider.  2020 Elsevier/Gold Standard (2017-03-16 12:37:09)

## 2019-02-13 ENCOUNTER — Telehealth: Payer: Self-pay | Admitting: Acute Care

## 2019-02-13 NOTE — Telephone Encounter (Signed)
Spoke with pt. 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 Pt states that she will be wearing a mask and surfaces will be cleaned every hour. PT advised that wearing a mask , frequent hand washing and frequent surface cleaning would need to be followed but it would be up to her own judgement whether to return or not. Pt verbalized understanding.  Nothing further needed at this time.

## 2019-03-06 ENCOUNTER — Telehealth: Payer: Self-pay | Admitting: Cardiology

## 2019-03-07 ENCOUNTER — Telehealth: Payer: Self-pay | Admitting: Cardiology

## 2019-03-07 NOTE — Telephone Encounter (Signed)
Uncertain this is related to her pravastatin, but will address.   STOP pravastatin 20mg  daily.  NO cholesterol medications for 7 days (drug holiday) After one week, START Crestor 5mg  three times per week.   Much of the statin benefit can be obtained by only taking the medication a few times per week. May help to mitigate potential side effects.

## 2019-03-07 NOTE — Telephone Encounter (Signed)
Patient states she started pravastatin on 02/02/2019 and she noticed that she "was not able to empty her bladder fully and it kept getting worse." Patient stopped this medication 2 days ago due to worsening of symptoms and says she can already see improvement.   Patient informed of Dr. Joya Gaskins recommendations and stated that she did not want to try another medication at this time. She wants to try to lower her cholesterol levels through diet and exercise. Laurann Montana, NP made aware. No further questions.

## 2019-03-07 NOTE — Telephone Encounter (Signed)
Duplicate encounter. Please see most recent encounter.

## 2019-03-17 ENCOUNTER — Ambulatory Visit (HOSPITAL_BASED_OUTPATIENT_CLINIC_OR_DEPARTMENT_OTHER)
Admission: RE | Admit: 2019-03-17 | Discharge: 2019-03-17 | Disposition: A | Discharge status: Home / Self Care | Payer: Medicare Other | Source: Ambulatory Visit | Attending: Family | Admitting: Family

## 2019-03-17 ENCOUNTER — Other Ambulatory Visit (HOSPITAL_BASED_OUTPATIENT_CLINIC_OR_DEPARTMENT_OTHER): Payer: Self-pay | Admitting: Obstetrics and Gynecology

## 2019-03-17 ENCOUNTER — Other Ambulatory Visit: Payer: Self-pay

## 2019-03-17 DIAGNOSIS — Z1231 Encounter for screening mammogram for malignant neoplasm of breast: Secondary | ICD-10-CM | POA: Insufficient documentation

## 2019-03-27 ENCOUNTER — Other Ambulatory Visit (HOSPITAL_BASED_OUTPATIENT_CLINIC_OR_DEPARTMENT_OTHER): Payer: Self-pay | Admitting: Obstetrics and Gynecology

## 2019-04-07 DIAGNOSIS — Z124 Encounter for screening for malignant neoplasm of cervix: Secondary | ICD-10-CM | POA: Diagnosis not present

## 2019-04-28 DIAGNOSIS — Z86010 Personal history of colonic polyps: Secondary | ICD-10-CM | POA: Diagnosis not present

## 2019-04-28 DIAGNOSIS — Z1211 Encounter for screening for malignant neoplasm of colon: Secondary | ICD-10-CM | POA: Diagnosis not present

## 2019-04-28 DIAGNOSIS — Z800 Family history of malignant neoplasm of digestive organs: Secondary | ICD-10-CM | POA: Diagnosis not present

## 2019-04-28 DIAGNOSIS — D122 Benign neoplasm of ascending colon: Secondary | ICD-10-CM | POA: Diagnosis not present

## 2019-04-28 DIAGNOSIS — K635 Polyp of colon: Secondary | ICD-10-CM | POA: Diagnosis not present

## 2019-04-28 DIAGNOSIS — K6389 Other specified diseases of intestine: Secondary | ICD-10-CM | POA: Diagnosis not present

## 2019-04-28 LAB — HM COLONOSCOPY

## 2019-05-02 ENCOUNTER — Ambulatory Visit (INDEPENDENT_AMBULATORY_CARE_PROVIDER_SITE_OTHER): Payer: Medicare Other | Admitting: *Deleted

## 2019-05-02 ENCOUNTER — Other Ambulatory Visit: Payer: Self-pay

## 2019-05-02 DIAGNOSIS — Z23 Encounter for immunization: Secondary | ICD-10-CM | POA: Diagnosis not present

## 2019-05-02 NOTE — Progress Notes (Signed)
Pre visit review using our clinic review tool, if applicable. No additional management support is needed unless otherwise documented below in the visit note.  Patient here for flu vaccine. 0.5mL flu vaccine given in left deltoid IM. Patient tolerated well. VIS given.   

## 2019-05-03 ENCOUNTER — Other Ambulatory Visit: Payer: Self-pay | Admitting: Family

## 2019-05-03 NOTE — Telephone Encounter (Signed)
Medication Refill - Medication:  albuterol (PROAIR HFA) 108 (90 Base) MCG/ACT inhaler  Has the patient contacted their pharmacy? Yes advised to call.   Preferred Pharmacy (with phone number or street name):  [facility], [place], [place], [place] [address] [phone]  Agent: Please be advised that RX refills may take up to 3 business days. We ask that you follow-up with your pharmacy.

## 2019-05-03 NOTE — Telephone Encounter (Signed)
Requested medication (s) are due for refill today: yes  Requested medication (s) are on the active medication list: yes  Last refill:  09/15/2017  Future visit scheduled: no  Notes to clinic:  Review for refill   Requested Prescriptions  Pending Prescriptions Disp Refills   albuterol (PROAIR HFA) 108 (90 Base) MCG/ACT inhaler      Sig: Inhale 2 puffs into the lungs every 4 (four) hours as needed.     Pulmonology:  Beta Agonists Failed - 05/03/2019  8:26 AM      Failed - One inhaler should last at least one month. If the patient is requesting refills earlier, contact the patient to check for uncontrolled symptoms.      Passed - Valid encounter within last 12 months    Recent Outpatient Visits          1 year ago Chronic bronchitis, unspecified chronic bronchitis type ([place])   [facility], NP   2 years ago Crushing injury of left foot, initial encounter   [facility], NP   2 years ago Acute bronchitis, unspecified organism   [facility] at [place], [place]

## 2019-05-09 ENCOUNTER — Ambulatory Visit (INDEPENDENT_AMBULATORY_CARE_PROVIDER_SITE_OTHER): Payer: Medicare Other | Admitting: Family

## 2019-05-09 ENCOUNTER — Encounter: Payer: Self-pay | Admitting: Family

## 2019-05-09 ENCOUNTER — Other Ambulatory Visit: Payer: Self-pay

## 2019-05-09 VITALS — BP 119/68 | HR 73 | Temp 97.4°F | Resp 16 | Wt 112.0 lb

## 2019-05-09 DIAGNOSIS — I2584 Coronary atherosclerosis due to calcified coronary lesion: Secondary | ICD-10-CM

## 2019-05-09 DIAGNOSIS — I2510 Atherosclerotic heart disease of native coronary artery without angina pectoris: Secondary | ICD-10-CM

## 2019-05-09 DIAGNOSIS — J449 Chronic obstructive pulmonary disease, unspecified: Secondary | ICD-10-CM

## 2019-05-09 MED ORDER — ALBUTEROL SULFATE HFA 108 (90 BASE) MCG/ACT IN AERS: 2.0000 | INHALATION_SPRAY | Freq: Four times a day (QID) | RESPIRATORY_TRACT | 5 refills | Status: DC | PRN

## 2019-05-09 NOTE — Progress Notes (Signed)
Subjective:    Patient ID: Renee Navarro, female    DOB: Nov 09, 1945, 73 y.o.   MRN: [id number]  HPI  Patient is a 73 yr old female who presents today for follow up.  COPD- reports that she finds that she becomes easily winded if she walks up an incline or if she walks for a prolonged distance. Uses albuterol as needed and reports that this helps considerably. She is requesting a refill of her albuterol.  She is  Up to date on her flu shot. She remains tobacco/smoke free.    Review of Systems See HPI  Past Medical History:  Diagnosis Date  . COPD (chronic obstructive pulmonary disease) ([place])   . Dyslipidemia   . Lung nodule seen on imaging study   . Osteoporosis   . Tobacco user      Social History   Socioeconomic History  . Marital status: Married    Spouse name: Not on file  . Number of children: Not on file  . Years of education: Not on file  . Highest education level: Not on file  Occupational History  . Occupation: Product manager: [employer]  Social Needs  . Financial resource strain: Not on file  . Food insecurity    Worry: Not on file    Inability: Not on file  . Transportation needs    Medical: Not on file    Non-medical: Not on file  Tobacco Use  . Smoking status: Former Smoker    Packs/day: 0.75    Years: 51.00    Pack years: 38.25    Types: Cigarettes    Quit date: 07/14/2012    Years since quitting: 6.8  . Smokeless tobacco: Never Used  Substance and Sexual Activity  . Alcohol use: Yes    Frequency: Never    Comment: occ glass of wine  . Drug use: No  . Sexual activity: Not on file  Lifestyle  . Physical activity    Days per week: Not on file    Minutes per session: Not on file  . Stress: Not on file  Relationships  . Social Herbalist on phone: Not on file    Gets together: Not on file    Attends religious service: Not on file    Active member of club or organization: Not on file    Attends  meetings of clubs or organizations: Not on file    Relationship status: Not on file  . Intimate partner violence    Fear of current or ex partner: Not on file    Emotionally abused: Not on file    Physically abused: Not on file    Forced sexual activity: Not on file  Other Topics Concern  . Not on file  Social History Narrative   Caffeine Use: occasional   Regular exercise:  No          Past Surgical History:  Procedure Laterality Date  . TUBAL LIGATION      Family History  Problem Relation Age of Onset  . Cancer Father        colon  . Diabetes Sister   . Hypertension Sister   . Lymphoma Brother     Allergies  Allergen Reactions  . Budesonide-Formoterol Fumarate     REACTION: cough    Current Outpatient Medications on File Prior to Visit  Medication Sig Dispense Refill  . aspirin 81 MG  tablet Take 81 mg by mouth daily.       No current facility-administered medications on file prior to visit.     BP 119/68 (BP Location: Left Arm, Patient Position: Sitting, Cuff Size: Small)   Pulse 73   Temp (!) 97.4 F (36.3 C) (Temporal)   Resp 16   Wt 112 lb (50.8 kg)   SpO2 100%   BMI 19.22 kg/m       Objective:   Physical Exam Constitutional:      Appearance: She is well-developed.  Neck:     Musculoskeletal: Neck supple.     Thyroid: No thyromegaly.  Cardiovascular:     Rate and Rhythm: Normal rate and regular rhythm.     Heart sounds: Normal heart sounds. No murmur.  Pulmonary:     Effort: Pulmonary effort is normal. No respiratory distress.     Breath sounds: Decreased breath sounds present. No wheezing.  Skin:    General: Skin is warm and dry.  Neurological:     Mental Status: She is alert and oriented to person, place, and time.  Psychiatric:        Behavior: Behavior normal.        Thought Content: Thought content normal.        Judgment: Judgment normal.           Assessment & Plan:  COPD- stable with use of prn albuterol. Advised pt to  continue as needed. 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

## 2019-08-15 ENCOUNTER — Ambulatory Visit: Payer: Medicare Other | Attending: Internal Medicine

## 2019-08-15 DIAGNOSIS — Z23 Encounter for immunization: Secondary | ICD-10-CM | POA: Diagnosis not present

## 2019-08-15 NOTE — Progress Notes (Signed)
   Covid-19 Vaccination Clinic  Name:  Renee Navarro    MRN: [id number] DOB: 01-30-1946  08/15/2019  [name] was observed post Covid-19 immunization for 15 minutes without incidence. She was provided with Vaccine Information Sheet and instruction to access the V-Safe system.   [name] was instructed to call 911 with any severe reactions post vaccine: Marland Kitchen Difficulty breathing  . Swelling of your face and throat  . A fast heartbeat  . A bad rash all over your body  . Dizziness and weakness    Immunizations Administered    Name Date Dose VIS Date Route   Pfizer COVID-19 Vaccine 08/15/2019 10:29 AM 0.3 mL 07/07/2019 Intramuscular   Manufacturer: [place]   Lot: [id number]   [place]: [id number]

## 2019-09-05 ENCOUNTER — Ambulatory Visit: Payer: Medicare Other | Attending: Internal Medicine

## 2019-09-05 DIAGNOSIS — Z23 Encounter for immunization: Secondary | ICD-10-CM | POA: Insufficient documentation

## 2019-09-05 NOTE — Progress Notes (Signed)
   Covid-19 Vaccination Clinic  Name:  Renee Navarro    MRN: [id number] DOB: 08-Sep-1945  09/05/2019  [name] was observed post Covid-19 immunization for 15 minutes without incidence. She was provided with Vaccine Information Sheet and instruction to access the V-Safe system.   [name] was instructed to call 911 with any severe reactions post vaccine: Marland Kitchen Difficulty breathing  . Swelling of your face and throat  . A fast heartbeat  . A bad rash all over your body  . Dizziness and weakness    Immunizations Administered    Name Date Dose VIS Date Route   Pfizer COVID-19 Vaccine 09/05/2019  9:39 AM 0.3 mL 07/07/2019 Intramuscular   Manufacturer: [place]   Lot: [id number]   [place]: [id number]

## 2019-09-26 ENCOUNTER — Ambulatory Visit (INDEPENDENT_AMBULATORY_CARE_PROVIDER_SITE_OTHER): Payer: Medicare Other | Admitting: Family

## 2019-09-26 ENCOUNTER — Encounter: Payer: Self-pay | Admitting: Family

## 2019-09-26 ENCOUNTER — Other Ambulatory Visit: Payer: Self-pay

## 2019-09-26 VITALS — BP 131/85 | HR 73 | Temp 96.5°F | Resp 16 | Ht 64.0 in | Wt 111.0 lb

## 2019-09-26 DIAGNOSIS — J449 Chronic obstructive pulmonary disease, unspecified: Secondary | ICD-10-CM

## 2019-09-26 DIAGNOSIS — R9431 Abnormal electrocardiogram [ECG] [EKG]: Secondary | ICD-10-CM | POA: Diagnosis not present

## 2019-09-26 NOTE — Progress Notes (Signed)
Subjective:    Patient ID: Renee Navarro, female    DOB: [birth year], 74 y.o.   MRN: [id number]  HPI   Patient is a 74 year old female who presents today for routine follow-up.  COPD- she is trying to walk daily.  Repors breathing is "OK." As long as she is not walking up a hill.  She recently completed the Covid vaccine series.  She is contemplating returning to work.  She is currently tobacco free.,  She had a CT performed June 2020 by her cardiologist.  There were no are no abnormal lung findings other than emphysematous changes.  Reports rare use of albuterol.    Had both of her COVID vaccines.  History of abnormal EKG-denies CP or palpitations.      Review of Systems See hpi  Past Medical History:  Diagnosis Date  . COPD (chronic obstructive pulmonary disease) ([place])   . Dyslipidemia   . Lung nodule seen on imaging study   . Osteoporosis   . Tobacco user      Social History   Socioeconomic History  . Marital status: Married    Spouse name: Not on file  . Number of children: Not on file  . Years of education: Not on file  . Highest education level: Not on file  Occupational History  . Occupation: Product manager: [employer]    Comment: [employer]  Tobacco Use  . Smoking status: Former Smoker    Packs/day: 0.75    Years: 51.00    Pack years: 38.25    Types: Cigarettes    Quit date: 07/14/2012    Years since quitting: 7.2  . Smokeless tobacco: Never Used  Substance and Sexual Activity  . Alcohol use: Yes    Comment: occ glass of wine  . Drug use: No  . Sexual activity: Not on file  Other Topics Concern  . Not on file  Social History Narrative   Caffeine Use: occasional   Regular exercise:  No         Social Determinants of Health   Financial Resource Strain:   . Difficulty of Paying Living Expenses: Not on file  Food Insecurity:   . Worried About Charity fundraiser in the Last Year: Not on file  . Ran Out of Food in the Last Year: Not  on file  Transportation Needs:   . Lack of Transportation (Medical): Not on file  . Lack of Transportation (Non-Medical): Not on file  Physical Activity:   . Days of Exercise per Week: Not on file  . Minutes of Exercise per Session: Not on file  Stress:   . Feeling of Stress : Not on file  Social Connections:   . Frequency of Communication with Friends and Family: Not on file  . Frequency of Social Gatherings with Friends and Family: Not on file  . Attends Religious Services: Not on file  . Active Member of Clubs or Organizations: Not on file  . Attends Archivist Meetings: Not on file  . Marital Status: Not on file  Intimate Partner Violence:   . Fear of Current or Ex-Partner: Not on file  . Emotionally Abused: Not on file  . Physically Abused: Not on file  . Sexually Abused: Not on file    Past Surgical History:  Procedure Laterality Date  . TUBAL LIGATION      Family History  Problem Relation Age of Onset  . Cancer Father  colon  . Diabetes Sister   . Hypertension Sister   . Lymphoma Brother     Allergies  Allergen Reactions  . Budesonide-Formoterol Fumarate     REACTION: cough    Current Outpatient Medications on File Prior to Visit  Medication Sig Dispense Refill  . albuterol (PROAIR HFA) 108 (90 Base) MCG/ACT inhaler Inhale 2 puffs into the lungs every 6 (six) hours as needed for shortness of breath. 6.7 g 5  . aspirin 81 MG tablet Take 81 mg by mouth daily.       No current facility-administered medications on file prior to visit.    BP 131/85 (BP Location: Right Arm, Patient Position: Sitting, Cuff Size: Small)   Pulse 73   Temp (!) 96.5 F (35.8 C) (Temporal)   Resp 16   Ht 5\' 4"  (1.626 m)   Wt 111 lb (50.3 kg)   SpO2 99%   BMI 19.05 kg/m       Objective:   Physical Exam Constitutional:      Appearance: She is well-developed.  Neck:     Thyroid: No thyromegaly.  Cardiovascular:     Rate and Rhythm: Normal rate and regular  rhythm.     Heart sounds: Normal heart sounds. No murmur.  Pulmonary:     Effort: Pulmonary effort is normal. No respiratory distress.     Breath sounds: Normal breath sounds. No wheezing.     Comments: Diminished breath sounds throughout Musculoskeletal:     Cervical back: Neck supple.  Skin:    General: Skin is warm and dry.  Neurological:     Mental Status: She is alert and oriented to person, place, and time.  Psychiatric:        Behavior: Behavior normal.        Thought Content: Thought content normal.        Judgment: Judgment normal.           Assessment & Plan:  Hx of abnormal EKG--Incomplete right bundle branch block and left axis -anterior fascicular block. She saw Cardiology for consult of this 09/22/18.  It was recommended that she have an annual EKG performed to check for development of first degree heart block.  I have personally interpreted the EKG today. Note is made of NSR with improved (now normal) PR interval when compared to last EKG on file. Note is again made of incomplete RBBB and LAFB.  She is advised to keep her upcoming appointment with cardiology.  COPD- stable with PRN albuterol.   Remains smoke free.  Monitor.  This visit occurred during the SARS-CoV-2 public health emergency.  Safety protocols were in place, including screening questions prior to the visit, additional usage of staff PPE, and extensive cleaning of exam room while observing appropriate contact time as indicated for disinfecting solutions.

## 2019-09-30 NOTE — Progress Notes (Signed)
Cardiology Office Note:    Date:  10/02/2019   ID:  Renee Navarro, DOB 01-Sep-1945, MRN [id number]  PCP:  Debbrah Alar, NP  Cardiologist:  Shirlee More, MD    Referring MD: Debbrah Alar, NP    ASSESSMENT:    1. Coronary artery calcification   2. Aortic atherosclerosis ([place])   3. 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 Obstructive chronic bronchitis without exacerbation gold stage A COPD   5. Pure hypercholesterolemia    PLAN:    In order of problems listed above:  1. Stable having no anginal discomfort we again discussed the potential of an ischemia evaluation she declined and will restart lipid-lowering treatment. 2. Stable conduction system disease on EKG at this time asymptomatic and she will need an EKG at yearly intervals. 3. COPD is stable with mild exertional symptoms 4. She has severe dyslipidemia and will start a low-dose of a low intensity statin pravastatin I told her if she has any muscle symptoms can reduce it to every other day.  She will a follow-up lipid profile in her PCP office.   Next appointment: 1 year I told her she develops exertional chest pain to be in touch with me and she need an ischemia evaluation   Medication Adjustments/Labs and Tests Ordered: Current medicines are reviewed at length with the patient today.  Concerns regarding medicines are outlined above.  No orders of the defined types were placed in this encounter.  No orders of the defined types were placed in this encounter.   Chief Complaint  Patient presents with  . Follow-up    For aortic and coronary artery calcification  . Hyperlipidemia  . Abnormal ECG    Incomplete right bundle branch block left anterior hemiblock    History of Present Illness:    Renee Navarro is a 74 y.o. female with a hx of COPD, asymptomatic bifascicular heart block (incomplete RBBB and LAHB)  last seen 01/25/2019 after lung cancer screening CT showed CAC and  she was initiated on statin therapy.  At that time she had no anginal discomfort and made a decision to continue with adequate therapy and deferred evaluation unless she was having typical exertional angina.  That was a virtual visit.  CT 01/06/19  FINDINGS: Cardiovascular: The heart size is normal. No substantial pericardial effusion. Coronary artery calcification is evident. Atherosclerotic calcification is noted in the wall of the thoracic aorta. IMPRESSION: 1. Lung-RADS 2, benign appearance or behavior. Continue annual screening with low-dose chest CT without contrast in 12 months. 2. Aortic Atherosclerois (ICD10-170.0) 3. Emphysema. 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  She has mild exertional shortness of breath walking uphill walking quicker but no chest discomfort the pattern is unchanged no palpitation or syncope.  She initially took her high intensity statin had trouble with urination is hesitant but agreed to retry a low-dose of a low intensity statin.  She thought her lipid profile was favorable. Past Medical History:  Diagnosis Date  . COPD (chronic obstructive pulmonary disease) ([place])   . Dyslipidemia   . Lung nodule seen on imaging study   . Osteoporosis   . Tobacco user     Past Surgical History:  Procedure Laterality Date  . TUBAL LIGATION      Current Medications: Current Meds  Medication Sig  . albuterol (PROAIR HFA)  108 (90 Base) MCG/ACT inhaler Inhale 2 puffs into the lungs every 6 (six) hours as needed for shortness of breath.     Allergies:   Budesonide-formoterol fumarate   Social History   Socioeconomic History  . Marital status: Married    Spouse name: Not on file  . Number of children: Not on file  . Years of education: Not on file  . Highest education level: Not on file  Occupational History  . Occupation: Associate Professor: [employer]    Comment: [employer]  Tobacco Use  . Smoking status: Former Smoker    Packs/day: 0.75    Years: 51.00    Pack years: 38.25    Types: Cigarettes    Quit date: 07/14/2012    Years since quitting: 7.2  . Smokeless tobacco: Never Used  Substance and Sexual Activity  . Alcohol use: Yes    Comment: occ glass of wine  . Drug use: No  . Sexual activity: Not on file  Other Topics Concern  . Not on file  Social History Narrative   Caffeine Use: occasional   Regular exercise:  No         Social Determinants of Health   Financial Resource Strain:   . Difficulty of Paying Living Expenses: Not on file  Food Insecurity:   . Worried About Charity fundraiser in the Last Year: Not on file  . Ran Out of Food in the Last Year: Not on file  Transportation Needs:   . Lack of Transportation (Medical): Not on file  . Lack of Transportation (Non-Medical): Not on file  Physical Activity:   . Days of Exercise per Week: Not on file  . Minutes of Exercise per Session: Not on file  Stress:   . Feeling of Stress : Not on file  Social Connections:   . Frequency of Communication with Friends and Family: Not on file  . Frequency of Social Gatherings with Friends and Family: Not on file  . Attends Religious Services: Not on file  . Active Member of Clubs or Organizations: Not on file  . Attends Archivist Meetings: Not on file  . Marital Status: Not on file     Family History: The patient's family history includes Cancer in her father; Diabetes in her sister; Hypertension in her sister; Lymphoma in her brother. ROS:   Please see the history of present illness.    All other systems reviewed and are negative.  EKGs/Labs/Other Studies Reviewed:    The following studies were reviewed today:  EKG:  EKG 09/26/19 demonstrates [place] incomplete RBBB and LAHB  Recent Labs: No results found for requested labs within last 8760 hours.  Recent Lipid Panel     Component Value Date/Time   CHOL 243 (H) 12/15/2017 0905   TRIG 103.0 12/15/2017 0905   HDL 71.10 12/15/2017 0905   CHOLHDL 3 12/15/2017 0905   VLDL 20.6 12/15/2017 0905   LDLCALC 151 (H) 12/15/2017 0905   LDLDIRECT 198.1 11/19/2008 1026    Physical Exam:    VS:  BP 122/72   Pulse 92   Temp (!) 97.3 F (36.3 C)   Ht 5\' 4"  (1.626 m)   Wt 111 lb 1.3 oz (50.4 kg)   LMP  (LMP Unknown)   SpO2 97%   BMI 19.07 kg/m     Wt Readings from Last 3 Encounters:  10/02/19 111 lb 1.3 oz (50.4 kg)  09/26/19 111 lb (50.3  kg)  05/09/19 112 lb (50.8 kg)     GEN:  Well nourished, well developed in no acute distress HEENT: Normal NECK: No JVD; No carotid bruits LYMPHATICS: No lymphadenopathy CARDIAC: RRR, no murmurs, rubs, gallops RESPIRATORY:  Clear to auscultation without rales, wheezing or rhonchi  ABDOMEN: Soft, non-tender, non-distended MUSCULOSKELETAL:  No edema; No deformity  SKIN: Warm and dry NEUROLOGIC:  Alert and oriented x 3 PSYCHIATRIC:  Normal affect    Signed, Shirlee More, MD  10/02/2019 9:22 AM    [facility]

## 2019-10-02 ENCOUNTER — Other Ambulatory Visit: Payer: Self-pay

## 2019-10-02 ENCOUNTER — Encounter: Payer: Self-pay | Admitting: Cardiology

## 2019-10-02 ENCOUNTER — Ambulatory Visit (INDEPENDENT_AMBULATORY_CARE_PROVIDER_SITE_OTHER): Payer: Medicare Other | Admitting: Cardiology

## 2019-10-02 VITALS — BP 122/72 | HR 92 | Temp 97.3°F | Ht 64.0 in | Wt 111.1 lb

## 2019-10-02 DIAGNOSIS — I2510 Atherosclerotic heart disease of native coronary artery without angina pectoris: Secondary | ICD-10-CM

## 2019-10-02 DIAGNOSIS — I700 Atherosclerosis of aorta: Secondary | ICD-10-CM | POA: Diagnosis not present

## 2019-10-02 DIAGNOSIS — J449 Chronic obstructive pulmonary disease, unspecified: Secondary | ICD-10-CM | POA: Diagnosis not present

## 2019-10-02 DIAGNOSIS — I452 Bifascicular block: Secondary | ICD-10-CM

## 2019-10-02 DIAGNOSIS — E7800 Pure hypercholesterolemia, unspecified: Secondary | ICD-10-CM | POA: Diagnosis not present

## 2019-10-02 DIAGNOSIS — I2584 Coronary atherosclerosis due to calcified coronary lesion: Secondary | ICD-10-CM | POA: Diagnosis not present

## 2019-10-02 MED ORDER — ROSUVASTATIN CALCIUM 20 MG PO TABS: 20.0000 mg | ORAL_TABLET | Freq: Every day | ORAL | 3 refills | Status: DC

## 2019-10-02 NOTE — Patient Instructions (Addendum)
Medication Instructions:  Your physician has recommended you make the following change in your medication:  1-START Rosuvastatin 20 mg by mouth daily.  *If you need a refill on your cardiac medications before your next appointment, please call your pharmacy*   Lab Work: If you have labs (blood work) drawn today and your tests are completely normal, you will receive your results only by: Marland Kitchen MyChart Message (if you have MyChart) OR . A paper copy in the mail If you have any lab test that is abnormal or we need to change your treatment, we will call you to review the results.  Follow-Up: At [facility], you and your health needs are our priority.  As part of our continuing mission to provide you with exceptional heart care, we have created designated Provider Care Teams.  These Care Teams include your primary Cardiologist (physician) and Advanced Practice Providers (APPs -  Physician Assistants and Nurse Practitioners) who all work together to provide you with the care you need, when you need it.  We recommend signing up for the patient portal called "MyChart".  Sign up information is provided on this After Visit Summary.  MyChart is used to connect with patients for Virtual Visits (Telemedicine).  Patients are able to view lab/test results, encounter notes, upcoming appointments, etc.  Non-urgent messages can be sent to your provider as well.   To learn more about what you can do with MyChart, go to [email or web address].    Your next appointment:   12 month(s)  The format for your next appointment:   Either In Person or Virtual  Provider:   You may see Shirlee More, MD or the following Advanced Practice Provider on your designated Care Team:    Laurann Montana, FNP

## 2020-01-08 ENCOUNTER — Other Ambulatory Visit (HOSPITAL_BASED_OUTPATIENT_CLINIC_OR_DEPARTMENT_OTHER): Payer: Self-pay | Admitting: Family

## 2020-01-08 ENCOUNTER — Ambulatory Visit (HOSPITAL_BASED_OUTPATIENT_CLINIC_OR_DEPARTMENT_OTHER)
Admission: RE | Admit: 2020-01-08 | Discharge: 2020-01-08 | Disposition: A | Discharge status: Home / Self Care | Payer: Medicare Other | Source: Ambulatory Visit | Attending: Acute Care | Admitting: Acute Care

## 2020-01-08 ENCOUNTER — Other Ambulatory Visit: Payer: Self-pay

## 2020-01-08 DIAGNOSIS — Z122 Encounter for screening for malignant neoplasm of respiratory organs: Secondary | ICD-10-CM | POA: Diagnosis not present

## 2020-01-08 DIAGNOSIS — Z87891 Personal history of nicotine dependence: Secondary | ICD-10-CM | POA: Insufficient documentation

## 2020-01-08 DIAGNOSIS — Z1231 Encounter for screening mammogram for malignant neoplasm of breast: Secondary | ICD-10-CM

## 2020-01-09 ENCOUNTER — Telehealth: Payer: Self-pay | Admitting: Acute Care

## 2020-01-09 NOTE — Telephone Encounter (Signed)
Spoke with patient and verified that she is requesting the results of her CT scan from 01/08/2020.  I explained that after the NP has a chance to review the CT scan the clinical staff will be asked to call her with the results.  She verbalized understanding.

## 2020-01-10 ENCOUNTER — Telehealth: Payer: Self-pay | Admitting: Acute Care

## 2020-01-10 NOTE — Telephone Encounter (Signed)
I will call this to the patient tomorrow morning. Thanks

## 2020-01-10 NOTE — Telephone Encounter (Signed)
Received a call report from [facility] from [place] in regards to the patient's CT scan from 6/14. Impression is below:   "IMPRESSION: 1. New 7.6 mm apical left upper lobe nodule. Lung-RADS 4A, suspicious. Follow up low-dose chest CT without contrast in 3 months (please use the following order, "CT CHEST LCS NODULE FOLLOW-UP W/O CM") is recommended. Alternatively, PET may be considered when there is a solid component 24mm or larger. These results will be called to the ordering clinician or representative by the Radiologist Assistant, and communication documented in the PACS or [employer]. 2. Aortic atherosclerosis (ICD10-I70.0). Coronary artery calcification. 3.  Emphysema (ICD10-J43.9)."  [name], please advise. Thanks!

## 2020-01-11 NOTE — Progress Notes (Signed)
I have called the patient with the results of her scan. I explained that there is a new nodule in her left upper lobe that will require a 3 month follow up to evaluate for stability, growth or resolution. She verbalized understanding. Denise please schedule a follow up in 3 months. She no longer is seeing Debbrah Alar, and is looking for a new PCP so no need to send to [facility]. I have given her recommendation for [place] PCP at [facility].   Please schedule follow up CT for 03/2020. Thanks so much

## 2020-01-11 NOTE — Telephone Encounter (Signed)
Pt requesting a mailed copy of CT results. Address on file is correct and confirmed.

## 2020-01-11 NOTE — Telephone Encounter (Signed)
Called and spoke with pt letting her know that CT results have been placed in the mail for her and she verbalized understanding. Nothing further needed.

## 2020-01-12 ENCOUNTER — Other Ambulatory Visit: Payer: Self-pay | Admitting: *Deleted

## 2020-01-12 DIAGNOSIS — Z87891 Personal history of nicotine dependence: Secondary | ICD-10-CM

## 2020-01-22 ENCOUNTER — Telehealth: Payer: Self-pay | Admitting: Acute Care

## 2020-01-22 NOTE — Telephone Encounter (Signed)
Spoke with pt and advised that Low dose chest CT report was faxed to [facility]. Pt verbalized understanding. Nothing further needed.

## 2020-01-26 ENCOUNTER — Telehealth: Payer: Self-pay | Admitting: Cardiology

## 2020-01-26 NOTE — Telephone Encounter (Signed)
I think it be best not to use a statin, we can use Zetia 10 mg daily which is generally well-tolerated and does not cause brain side effects.

## 2020-01-26 NOTE — Telephone Encounter (Signed)
Left message on patients voicemail to please return our call.   

## 2020-01-26 NOTE — Telephone Encounter (Signed)
Pt c/o medication issue:  1. Name of Medication: rosuvastatin (CRESTOR) 20 MG tablet  2. How are you currently taking this medication (dosage and times per day)?   3. Are you having a reaction (difficulty breathing--STAT)?   4. What is your medication issue? Patient stopped taking if for a month now, because she was forgetting her words.  She said since she did that the problem went away.

## 2020-01-30 NOTE — Telephone Encounter (Signed)
Left message on patients voicemail to please return our call.   

## 2020-01-31 NOTE — Telephone Encounter (Signed)
Left message on patients voicemail to please return our call. I gave her our call back number but will be closing this telephone note at this time as I have tried calling the patient x3 with no answer.

## 2020-02-19 DIAGNOSIS — H40003 Preglaucoma, unspecified, bilateral: Secondary | ICD-10-CM | POA: Diagnosis not present

## 2020-02-19 DIAGNOSIS — H25813 Combined forms of age-related cataract, bilateral: Secondary | ICD-10-CM | POA: Diagnosis not present

## 2020-03-19 ENCOUNTER — Encounter (HOSPITAL_BASED_OUTPATIENT_CLINIC_OR_DEPARTMENT_OTHER): Payer: Self-pay

## 2020-03-19 ENCOUNTER — Ambulatory Visit (HOSPITAL_BASED_OUTPATIENT_CLINIC_OR_DEPARTMENT_OTHER)
Admission: RE | Admit: 2020-03-19 | Discharge: 2020-03-19 | Disposition: A | Discharge status: Home / Self Care | Payer: Medicare Other | Source: Ambulatory Visit | Attending: Family | Admitting: Family

## 2020-03-19 ENCOUNTER — Telehealth: Payer: Self-pay | Admitting: Family

## 2020-03-19 ENCOUNTER — Other Ambulatory Visit: Payer: Self-pay

## 2020-03-19 DIAGNOSIS — Z1231 Encounter for screening mammogram for malignant neoplasm of breast: Secondary | ICD-10-CM | POA: Insufficient documentation

## 2020-03-19 NOTE — Telephone Encounter (Signed)
Patient complaining or rash "on her but for about a month, diaper cream not helping". She will like to get a rx sent to her pharmacy.   I offered her an appointment this afternoon but she said she can not make it.

## 2020-03-19 NOTE — Telephone Encounter (Signed)
Unfortunately, she will need an office visit so we can look at the rash and decide how best to treat it.

## 2020-03-19 NOTE — Telephone Encounter (Signed)
Opened in error

## 2020-03-19 NOTE — Telephone Encounter (Signed)
Also, please let pt know that the radiologist would like her to complete some additional breast images for further evaluation. Let me know if she has not been contacted by them about a follow up appointment in 1 week.

## 2020-03-19 NOTE — Telephone Encounter (Signed)
Renee Navarro came in to the office wanting to know if Renee Navarro had any appointments, explained that she was full at this time. She asked if a nurse could give her a call as she has some concerns that she wanted to ask.

## 2020-03-20 ENCOUNTER — Other Ambulatory Visit: Payer: Self-pay | Admitting: Family

## 2020-03-20 ENCOUNTER — Telehealth: Payer: Self-pay | Admitting: Acute Care

## 2020-03-20 DIAGNOSIS — R928 Other abnormal and inconclusive findings on diagnostic imaging of breast: Secondary | ICD-10-CM

## 2020-03-20 NOTE — Telephone Encounter (Signed)
I have spoken with Renee Navarro and she is concerned about the cost of a repeat CT.   We have scheduled her LCS Nodule follow up CT with [facility] on 04/10/20 @ 10:00am. I have also emailed her appt information to her since this will be a new location for her

## 2020-03-20 NOTE — Telephone Encounter (Signed)
Spoke with pt and she is wanting to go ahead and get schedule for Chest CTG f/u that is due 04/09/20. Will forward to [place] for scheduling.

## 2020-03-20 NOTE — Telephone Encounter (Signed)
I have left a message for Renee Navarro to call me back to schedule her 3 month follow up CT

## 2020-03-20 NOTE — Telephone Encounter (Signed)
Spoke to patient and scheduled her for visit on Friday. She is already scheduled for additional breast images 04-03-20.

## 2020-03-22 ENCOUNTER — Other Ambulatory Visit: Payer: Self-pay

## 2020-03-22 ENCOUNTER — Encounter: Payer: Self-pay | Admitting: Family

## 2020-03-22 ENCOUNTER — Ambulatory Visit (INDEPENDENT_AMBULATORY_CARE_PROVIDER_SITE_OTHER): Payer: Medicare Other | Admitting: Family

## 2020-03-22 VITALS — BP 125/73 | HR 70 | Temp 98.4°F | Resp 16 | Wt 111.0 lb

## 2020-03-22 DIAGNOSIS — I2584 Coronary atherosclerosis due to calcified coronary lesion: Secondary | ICD-10-CM | POA: Diagnosis not present

## 2020-03-22 DIAGNOSIS — L304 Erythema intertrigo: Secondary | ICD-10-CM | POA: Diagnosis not present

## 2020-03-22 DIAGNOSIS — I2510 Atherosclerotic heart disease of native coronary artery without angina pectoris: Secondary | ICD-10-CM | POA: Diagnosis not present

## 2020-03-22 MED ORDER — NYSTATIN 100000 UNIT/GM EX CREA: 1.0000 "application " | TOPICAL_CREAM | Freq: Two times a day (BID) | CUTANEOUS | 1 refills | Status: DC

## 2020-03-22 NOTE — Progress Notes (Signed)
Subjective:    Patient ID: Renee Navarro, female    DOB: July 04, 1946, 74 y.o.   MRN: [id number]  HPI  Patient is a 74 yr old female who presents today with chief complaint of rash. Rash is located in between her buttocks and she reports that it feels irritated. She has been using diaper cream without improvement.  Review of Systems See HPI  Past Medical History:  Diagnosis Date  . COPD (chronic obstructive pulmonary disease) ([place])   . Dyslipidemia   . Lung nodule seen on imaging study   . Osteoporosis   . Tobacco user      Social History   Socioeconomic History  . Marital status: Married    Spouse name: Not on file  . Number of children: Not on file  . Years of education: Not on file  . Highest education level: Not on file  Occupational History  . Occupation: Product manager: [employer]    Comment: [employer]  Tobacco Use  . Smoking status: Former Smoker    Packs/day: 0.75    Years: 51.00    Pack years: 38.25    Types: Cigarettes    Quit date: 07/14/2012    Years since quitting: 7.6  . Smokeless tobacco: Never Used  Vaping Use  . Vaping Use: Never used  Substance and Sexual Activity  . Alcohol use: Yes    Comment: occ glass of wine  . Drug use: No  . Sexual activity: Not on file  Other Topics Concern  . Not on file  Social History Narrative   Caffeine Use: occasional   Regular exercise:  No         Social Determinants of Health   Financial Resource Strain:   . Difficulty of Paying Living Expenses: Not on file  Food Insecurity:   . Worried About Charity fundraiser in the Last Year: Not on file  . Ran Out of Food in the Last Year: Not on file  Transportation Needs:   . Lack of Transportation (Medical): Not on file  . Lack of Transportation (Non-Medical): Not on file  Physical Activity:   . Days of Exercise per Week: Not on file  . Minutes of Exercise per Session: Not on file  Stress:   . Feeling of Stress : Not on file  Social  Connections:   . Frequency of Communication with Friends and Family: Not on file  . Frequency of Social Gatherings with Friends and Family: Not on file  . Attends Religious Services: Not on file  . Active Member of Clubs or Organizations: Not on file  . Attends Archivist Meetings: Not on file  . Marital Status: Not on file  Intimate Partner Violence:   . Fear of Current or Ex-Partner: Not on file  . Emotionally Abused: Not on file  . Physically Abused: Not on file  . Sexually Abused: Not on file    Past Surgical History:  Procedure Laterality Date  . TUBAL LIGATION      Family History  Problem Relation Age of Onset  . Cancer Father        colon  . Diabetes Sister   . Hypertension Sister   . Lymphoma Brother     Allergies  Allergen Reactions  . Budesonide-Formoterol Fumarate     REACTION: cough    Current Outpatient Medications on File Prior to Visit  Medication Sig Dispense Refill  . albuterol (PROAIR HFA) 108 (90  Base) MCG/ACT inhaler Inhale 2 puffs into the lungs every 6 (six) hours as needed for shortness of breath. 6.7 g 5   No current facility-administered medications on file prior to visit.    BP 125/73 (BP Location: Right Arm, Patient Position: Sitting, Cuff Size: Small)   Pulse 70   Temp 98.4 F (36.9 C) (Oral)   Resp 16   Wt 111 lb (50.3 kg)   LMP  (LMP Unknown)   SpO2 100%   BMI 19.05 kg/m       Objective:   Physical Exam Constitutional:      Appearance: Normal appearance. She is not ill-appearing.  Skin:    General: Skin is warm and dry.     Comments: Erythema noted between buttocks and around anus  Neurological:     Mental Status: She is alert.           Assessment & Plan:  Intertrigo- new. Will give trial of nystatin cream. Pt is advised to call if symptoms worsen or if symptoms fail to improve.  This visit occurred during the SARS-CoV-2 public health emergency.  Safety protocols were in place, including screening  questions prior to the visit, additional usage of staff PPE, and extensive cleaning of exam room while observing appropriate contact time as indicated for disinfecting solutions.

## 2020-03-22 NOTE — Patient Instructions (Signed)
Please begin nystatin cream twice daily to affected area for 1 week then as needed. Please call if symptoms worsen or if symptoms fail to improve.   Intertrigo Intertrigo is skin irritation or inflammation (dermatitis) that occurs when folds of skin rub together. The irritation can cause a rash and make skin raw and itchy. This condition most commonly occurs in the skin folds of these areas:  Toes.  Armpits.  Groin.  Under the belly.  Under the breasts.  Buttocks. Intertrigo is not passed from person to person (is not contagious). What are the causes? This condition is caused by heat, moisture, rubbing (friction), and not enough air circulation. The condition can be made worse by:  Sweat.  Bacteria.  A fungus, such as yeast. What increases the risk? This condition is more likely to occur if you have moisture in your skin folds. You are more likely to develop this condition if you:  Have diabetes.  Are overweight.  Are not able to move around or are not active.  Live in a warm and moist climate.  Wear splints, braces, or other medical devices.  Are not able to control your bowels or bladder (have incontinence). What are the signs or symptoms? Symptoms of this condition include:  A pink or red skin rash in the skin fold or near the skin fold.  Raw or scaly skin.  Itchiness.  A burning feeling.  Bleeding.  Leaking fluid.  A bad smell. How is this diagnosed? This condition is diagnosed with a medical history and physical exam. You may also have a skin swab to test for bacteria or a fungus. How is this treated? This condition may be treated by:  Cleaning and drying your skin.  Taking an antibiotic medicine or using an antibiotic skin cream for a bacterial infection.  Using an antifungal cream on your skin or taking pills for an infection that was caused by a fungus, such as yeast.  Using a steroid ointment to relieve itchiness and  irritation.  Separating the skin fold with a clean cotton cloth to absorb moisture and allow air to flow into the area. Follow these instructions at home:  Keep the affected area clean and dry.  Do not scratch your skin.  Stay in a cool environment as much as possible. Use an air conditioner or fan, if available.  Apply over-the-counter and prescription medicines only as told by your health care provider.  If you were prescribed an antibiotic medicine, use it as told by your health care provider. Do not stop using the antibiotic even if your condition improves.  Keep all follow-up visits as told by your health care provider. This is important. How is this prevented?   Maintain a healthy weight.  Take care of your feet, especially if you have diabetes. Foot care includes: ? Wearing shoes that fit well. ? Keeping your feet dry. ? Wearing clean, breathable socks.  Protect the skin around your groin and buttocks, especially if you have incontinence. Skin protection includes: ? Following a regular cleaning routine. ? Using skin protectant creams, powders, or ointments. ? Changing protection pads frequently.  Do not wear tight clothes. Wear clothes that are loose, absorbent, and made of cotton.  Wear a bra that gives good support, if needed.  Shower and dry yourself well after activity or exercise. Use a hair dryer on a cool setting to dry between skin folds, especially after you bathe.  If you have diabetes, keep your blood sugar under control.  Contact a health care provider if:  Your symptoms do not improve with treatment.  Your symptoms get worse or they spread.  You notice increased redness and warmth.  You have a fever. Summary  Intertrigo is skin irritation or inflammation (dermatitis) that occurs when folds of skin rub together.  This condition is caused by heat, moisture, rubbing (friction), and not enough air circulation.  This condition may be treated by  cleaning and drying your skin and with medicines.  Apply over-the-counter and prescription medicines only as told by your health care provider.  Keep all follow-up visits as told by your health care provider. This is important. This information is not intended to replace advice given to you by your health care provider. Make sure you discuss any questions you have with your health care provider. Document Revised: 12/13/2017 Document Reviewed: 12/13/2017 Elsevier Patient Education  [place].

## 2020-03-27 ENCOUNTER — Telehealth: Payer: Self-pay | Admitting: Family

## 2020-03-27 MED ORDER — CLOTRIMAZOLE-BETAMETHASONE 1-0.05 % EX CREA: 1.0000 "application " | TOPICAL_CREAM | Freq: Two times a day (BID) | CUTANEOUS | 0 refills | Status: DC

## 2020-03-27 NOTE — Telephone Encounter (Signed)
Left message on voicemail for pt that new rx has been sent to her pharmacy.

## 2020-03-27 NOTE — Telephone Encounter (Signed)
nystatin cream (MYCOSTATIN) [[id number]]     Patient states medication is not working for her rash and would like another medication sent in please.

## 2020-04-03 ENCOUNTER — Telehealth: Payer: Self-pay | Admitting: Family

## 2020-04-03 ENCOUNTER — Ambulatory Visit
Admission: RE | Admit: 2020-04-03 | Discharge: 2020-04-03 | Disposition: A | Discharge status: Home / Self Care | Payer: Medicare Other | Source: Ambulatory Visit | Attending: Family | Admitting: Family

## 2020-04-03 ENCOUNTER — Other Ambulatory Visit: Payer: Self-pay

## 2020-04-03 DIAGNOSIS — R928 Other abnormal and inconclusive findings on diagnostic imaging of breast: Secondary | ICD-10-CM

## 2020-04-03 DIAGNOSIS — N6311 Unspecified lump in the right breast, upper outer quadrant: Secondary | ICD-10-CM | POA: Diagnosis not present

## 2020-04-03 NOTE — Telephone Encounter (Signed)
It looks like radiologist already spoke to her about need for breast MRI. Please let me know if she has any trouble getting this scheduled.

## 2020-04-03 NOTE — Telephone Encounter (Signed)
Please advise pt that I placed the MRI order. Our office will work on Government social research officer and once we have approval she will be contacted to schedule.

## 2020-04-03 NOTE — Telephone Encounter (Signed)
See previous phone note.  

## 2020-04-03 NOTE — Telephone Encounter (Signed)
Caller: Litsy Call Back # [phone]  Patient states went to breast center, patient states they would like her to go for a MRI @ [place] and patient is asking for [name] to write a order a fax to [facility].

## 2020-04-03 NOTE — Telephone Encounter (Signed)
Patient has been advised. She verbalized understanding and will wait for a call to schedule

## 2020-04-03 NOTE — Telephone Encounter (Signed)
Patient is checking the status of referral

## 2020-04-04 ENCOUNTER — Telehealth: Payer: Self-pay | Admitting: Family

## 2020-04-04 DIAGNOSIS — R928 Other abnormal and inconclusive findings on diagnostic imaging of breast: Secondary | ICD-10-CM

## 2020-04-04 NOTE — Telephone Encounter (Signed)
See order(s).

## 2020-04-05 ENCOUNTER — Other Ambulatory Visit (HOSPITAL_BASED_OUTPATIENT_CLINIC_OR_DEPARTMENT_OTHER): Payer: Self-pay | Admitting: Acute Care

## 2020-04-05 DIAGNOSIS — Z87891 Personal history of nicotine dependence: Secondary | ICD-10-CM

## 2020-04-09 ENCOUNTER — Telehealth: Payer: Self-pay | Admitting: Family

## 2020-04-09 ENCOUNTER — Ambulatory Visit
Admission: RE | Admit: 2020-04-09 | Discharge: 2020-04-09 | Disposition: A | Discharge status: Home / Self Care | Payer: Medicare Other | Source: Ambulatory Visit | Attending: Family | Admitting: Family

## 2020-04-09 ENCOUNTER — Other Ambulatory Visit: Payer: Medicare Other

## 2020-04-09 DIAGNOSIS — N6489 Other specified disorders of breast: Secondary | ICD-10-CM | POA: Diagnosis not present

## 2020-04-09 DIAGNOSIS — R928 Other abnormal and inconclusive findings on diagnostic imaging of breast: Secondary | ICD-10-CM

## 2020-04-09 MED ORDER — GADOBUTROL 1 MMOL/ML IV SOLN
11.0000 mL | Freq: Once | INTRAVENOUS | Status: AC | PRN
  Administered 2020-04-09: 11 mL via INTRAVENOUS

## 2020-04-09 MED ORDER — FLUCONAZOLE 150 MG PO TABS: ORAL_TABLET | ORAL | 0 refills | Status: DC

## 2020-04-09 NOTE — Telephone Encounter (Signed)
Spoke with pt. Reviewed negative Breast MRI results with her. She states still having rash between buttocks. She plans to use an otc diaper rash cream and will also rx with diflucan. She will let me know if her symptoms fail to improve.

## 2020-04-10 ENCOUNTER — Other Ambulatory Visit: Payer: Self-pay

## 2020-04-10 ENCOUNTER — Ambulatory Visit (HOSPITAL_BASED_OUTPATIENT_CLINIC_OR_DEPARTMENT_OTHER)
Admission: RE | Admit: 2020-04-10 | Discharge: 2020-04-10 | Disposition: A | Discharge status: Home / Self Care | Payer: Medicare Other | Source: Ambulatory Visit | Attending: Acute Care | Admitting: Acute Care

## 2020-04-10 DIAGNOSIS — Z87891 Personal history of nicotine dependence: Secondary | ICD-10-CM | POA: Diagnosis not present

## 2020-04-10 DIAGNOSIS — J432 Centrilobular emphysema: Secondary | ICD-10-CM | POA: Diagnosis not present

## 2020-04-10 DIAGNOSIS — R911 Solitary pulmonary nodule: Secondary | ICD-10-CM | POA: Diagnosis not present

## 2020-04-10 DIAGNOSIS — I700 Atherosclerosis of aorta: Secondary | ICD-10-CM | POA: Diagnosis not present

## 2020-04-10 DIAGNOSIS — I2510 Atherosclerotic heart disease of native coronary artery without angina pectoris: Secondary | ICD-10-CM | POA: Diagnosis not present

## 2020-04-12 ENCOUNTER — Telehealth: Payer: Self-pay | Admitting: Family

## 2020-04-12 ENCOUNTER — Telehealth: Payer: Self-pay | Admitting: Acute Care

## 2020-04-12 ENCOUNTER — Other Ambulatory Visit: Payer: Self-pay

## 2020-04-12 MED ORDER — FLUCONAZOLE 150 MG PO TABS: ORAL_TABLET | ORAL | 0 refills | Status: DC

## 2020-04-12 NOTE — Telephone Encounter (Signed)
CallerGlennys Schorsch  Call Back # [phone]  Patient states the cream given at her last appointment did not help her rash. Patient is requesting a referral to a dermatologist.   Please Advise

## 2020-04-12 NOTE — Telephone Encounter (Signed)
Patient advised rx was sent in, ok per provider

## 2020-04-12 NOTE — Telephone Encounter (Signed)
Patient called back and states she would like Melissa to sent another prescription to pharmacy. Patient states rash improved a tiny bit.    Medication:  fluconazole (DIFLUCAN) 150 MG tablet [[id number]]   Has the patient contacted their pharmacy? No. (If no, request that the patient contact the pharmacy for the refill.) (If yes, when and what did the pharmacy advise?)  Preferred Pharmacy (with phone number or street name): [facility] [address], [place] - [place]  [place], [place] [address]  Phone:  [phone] Fax:  [phone]  DEA #:  --  Agent: Please be advised that RX refills may take up to 3 business days. We ask that you follow-up with your pharmacy.

## 2020-04-12 NOTE — Telephone Encounter (Signed)
Pt notified ct not read yet  Please advise once you have reviewed results  Thanks

## 2020-04-15 ENCOUNTER — Other Ambulatory Visit: Payer: Self-pay | Admitting: Acute Care

## 2020-04-15 ENCOUNTER — Telehealth: Payer: Self-pay | Admitting: Acute Care

## 2020-04-15 DIAGNOSIS — R911 Solitary pulmonary nodule: Secondary | ICD-10-CM

## 2020-04-15 NOTE — Telephone Encounter (Signed)
Spoke with Renee Navarro regarding patient's repost.  Renee Navarro can you please advise.   Thank you   CT CHEST LCS NODULE F/U WO CONTRAST (Accession [id number]) (Order [id number]) Imaging Date: 04/10/2020 Department: [facility] Released By: Antonieta Loveless Authorizing: Magdalen Spatz, NP  Exam Status  Status  Final [99]  PACS Intelerad Image Link  Show images for CT CHEST LCS NODULE F/U WO CONTRAST Study Result  Narrative & Impression  CLINICAL DATA:  74 year old female former smoker (quit within the last 15 years) with 47 pack-year history of smoking. Lung cancer screening examination.  EXAM: CT CHEST WITHOUT CONTRAST FOR LUNG CANCER SCREENING NODULE FOLLOW-UP  TECHNIQUE: Multidetector CT imaging of the chest was performed following the standard protocol without IV contrast.  COMPARISON:  Follow-up for prior abnormal low-dose lung cancer screening chest CT 01/08/2020.  FINDINGS: Cardiovascular: Heart size is normal. There is no significant pericardial fluid, thickening or pericardial calcification. There is aortic atherosclerosis, as well as atherosclerosis of the great vessels of the mediastinum and the coronary arteries, including calcified atherosclerotic plaque in the left main, left anterior descending and right coronary arteries.  Mediastinum/Nodes: No pathologically enlarged mediastinal or hilar lymph nodes. Please note that accurate exclusion of hilar adenopathy is limited on noncontrast CT scans. Esophagus is unremarkable in appearance. No axillary lymphadenopathy.  Lungs/Pleura: The nodule of concern on the prior examination in the left upper lobe near the apex (axial image 50 of series 3) has enlarged slightly compared to the prior study, with a volume derived mean diameter of 9.2 mm on today's examination, with macrolobulated and slightly spiculated margins, concerning for a small neoplasm. Several other  pulmonary nodules are noted elsewhere in the lungs bilaterally, generally similar in number and size compared to the prior study. No acute consolidative airspace disease. No pleural effusions. Diffuse bronchial wall thickening with moderate centrilobular and paraseptal emphysema. Extensive bilateral apical nodular pleuroparenchymal thickening and architectural distortion, most compatible with areas of chronic post infectious or inflammatory scarring, similar to prior studies.  Upper Abdomen: Aortic atherosclerosis.  Musculoskeletal: There are no aggressive appearing lytic or blastic lesions noted in the visualized portions of the skeleton.  IMPRESSION: 1. Previously noted left upper lobe nodule of concern continues to enlarge and demonstrates aggressive imaging characteristics, categorized as Lung-RADS 4BS, suspicious. Additional imaging evaluation or consultation with Pulmonology or Thoracic Surgery recommended. 2. The "S" modifier above refers to potentially clinically significant non lung cancer related findings. Specifically, there is aortic atherosclerosis, in addition to left main and 2 vessel coronary artery disease. Assessment for potential risk factor modification, dietary therapy or pharmacologic therapy may be warranted, if clinically indicated. 3. Diffuse bronchial wall thickening with moderate centrilobular and paraseptal emphysema; imaging findings suggestive of underlying COPD.  These results will be called to the ordering clinician or representative by the Radiologist Assistant, and communication documented in the PACS or [employer].  Aortic Atherosclerosis (ICD10-I70.0) and Emphysema (ICD10-J43.9).   Electronically Signed   By: Vinnie Langton M.D.   On: 04/15/2020 12:41

## 2020-04-15 NOTE — Telephone Encounter (Signed)
I will notify patient. Thanks

## 2020-04-15 NOTE — Telephone Encounter (Signed)
Your welcome.

## 2020-04-15 NOTE — Progress Notes (Signed)
I have called the results to the patient. I explained that her scan was read as a Lung RADS 4 B which  indicates suspicious findings for which additional diagnostic testing and or tissue sampling is recommended. I told her the nodule that we first noted in June has grown form 7.6 mm to 9.2 mm in 3 months and that we need to better evaluate it.  I explained that I will order a PET scan and PFT's, and that she will receive a call to schedule both tomorrow since it is after 5:30 tonight.  She has verbalized understanding of the above , and had no further questions at completion of the call. She does have the office contact number for any further questions.

## 2020-04-16 ENCOUNTER — Telehealth: Payer: Self-pay | Admitting: Acute Care

## 2020-04-16 DIAGNOSIS — R911 Solitary pulmonary nodule: Secondary | ICD-10-CM

## 2020-04-16 NOTE — Telephone Encounter (Signed)
See phone note from 04/16/20 regarding super D inquiry from pt.  See result note from low dose CT chest, pt has already been called.   Nothing further needed at this time.

## 2020-04-16 NOTE — Telephone Encounter (Signed)
Patient states needs super CT scan scheduled before 04/30/2020. Needs before Bronchoscopy.

## 2020-04-16 NOTE — Telephone Encounter (Signed)
Spoke with the pt  I advised the PET scan looks like a donut and is open  She states that this should be fine for her  Nothing further needed

## 2020-04-16 NOTE — Telephone Encounter (Signed)
I spoke with patient, she had questions regarding her upcoming super D CT.  Advised that it had been ordered and that one of our patient care coordinators would be giving her a call to get it scheduled.  Per Sarah's note, it should be scheduled for 10/1 prior to her bronch and consult with Dr. Valeta Harms.  Patient verbalized understanding.  Nothing further needed.

## 2020-04-16 NOTE — Telephone Encounter (Signed)
Navigational bronch with Icard on 04/30/20

## 2020-04-16 NOTE — Telephone Encounter (Signed)
I have called the patient and discussed that I  have reviewed her case with Dr. Valeta Harms, and that he has reviewed her scan.   He would like to go ahead and get her scheduled for Navigational bronch for 10/5.  She has PET scan ordered for 9/29 We have ordered Super D CT for 10/1 She needs to be set up to see Dr. Valeta Harms   In the office on 04/29/2020 in a nodule consult slot.  Orders have been entered.

## 2020-04-17 ENCOUNTER — Telehealth: Payer: Self-pay | Admitting: Family

## 2020-04-17 ENCOUNTER — Telehealth: Payer: Self-pay | Admitting: Pulmonary Disease

## 2020-04-17 DIAGNOSIS — R911 Solitary pulmonary nodule: Secondary | ICD-10-CM

## 2020-04-17 DIAGNOSIS — R21 Rash and other nonspecific skin eruption: Secondary | ICD-10-CM

## 2020-04-17 NOTE — Telephone Encounter (Signed)
I have scheduled patient for ENB on 10/5 at 11:35 at [facility].  She will get Super D on 9/30 at [facility].  I spoke to Franklin at [facility] CT and they will get disc.  Pt is going for covid test on 10/2.  She is scheduled to come to our office tomorrow to get a PFT and will get labs then.  Lauren - can you put in lab orders please.

## 2020-04-17 NOTE — Telephone Encounter (Signed)
Thanks

## 2020-04-17 NOTE — Telephone Encounter (Signed)
BMET and CBC. Thanks

## 2020-04-17 NOTE — Telephone Encounter (Signed)
Patient states the prescription is not working and  would like a dermatology referral.

## 2020-04-17 NOTE — Telephone Encounter (Signed)
Renee Navarro, labs have been put in the computer.

## 2020-04-17 NOTE — Telephone Encounter (Signed)
Triage can place labs but do not see where it says what labs are needed  Please advise

## 2020-04-17 NOTE — Telephone Encounter (Signed)
I sent telephone message info to BI in staff message to make sure he was aware since telephone message had been closed out.  BI is aware of appt info.  Nothing further needed.

## 2020-04-17 NOTE — Telephone Encounter (Signed)
Thanks for helping coordinate Wann

## 2020-04-17 NOTE — Addendum Note (Signed)
Addended by: Vanessa Barbara on: 04/17/2020 02:01 PM   Modules accepted: Orders

## 2020-04-17 NOTE — Telephone Encounter (Signed)
Labs placed per Eric Form NP.

## 2020-04-18 ENCOUNTER — Other Ambulatory Visit: Payer: Self-pay

## 2020-04-18 ENCOUNTER — Ambulatory Visit (INDEPENDENT_AMBULATORY_CARE_PROVIDER_SITE_OTHER): Payer: Medicare Other | Admitting: Pulmonary Disease

## 2020-04-18 DIAGNOSIS — R911 Solitary pulmonary nodule: Secondary | ICD-10-CM

## 2020-04-18 LAB — CBC
HCT: 45.3 % (ref 36.0–46.0)
Hemoglobin: 15.1 g/dL — ABNORMAL HIGH (ref 12.0–15.0)
MCHC: 33.3 g/dL (ref 30.0–36.0)
MCV: 90 fl (ref 78.0–100.0)
Platelets: 270 10*3/uL (ref 150.0–400.0)
RBC: 5.04 Mil/uL (ref 3.87–5.11)
RDW: 14.2 % (ref 11.5–15.5)
WBC: 6.7 10*3/uL (ref 4.0–10.5)

## 2020-04-18 LAB — BASIC METABOLIC PANEL
BUN: 11 mg/dL (ref 6–23)
CO2: 31 mEq/L (ref 19–32)
Calcium: 9.6 mg/dL (ref 8.4–10.5)
Chloride: 101 mEq/L (ref 96–112)
Creatinine, Ser: 0.71 mg/dL (ref 0.40–1.20)
GFR: 80.96 mL/min (ref >60.00)
Glucose, Bld: 110 mg/dL — ABNORMAL HIGH (ref 70–99)
Potassium: 4.3 mEq/L (ref 3.5–5.1)
Sodium: 139 mEq/L (ref 135–145)

## 2020-04-18 LAB — PULMONARY FUNCTION TEST
DL/VA % pred: 95 %
DL/VA: 3.95 ml/min/mmHg/L
DLCO cor % pred: 68 %
DLCO cor: 13.35 ml/min/mmHg
DLCO unc % pred: 68 %
DLCO unc: 13.35 ml/min/mmHg
FEF 25-75 Post: 0.61 L/sec
FEF 25-75 Pre: 0.6 L/sec
FEF2575-%Change-Post: 0 %
FEF2575-%Pred-Post: 32 %
FEF2575-%Pred-Pre: 32 %
FEV1-%Change-Post: 0 %
FEV1-%Pred-Post: 50 %
FEV1-%Pred-Pre: 50 %
FEV1-Post: 1.12 L
FEV1-Pre: 1.12 L
FEV1FVC-%Change-Post: -1 %
FEV1FVC-%Pred-Pre: 85 %
FEV6-%Change-Post: 2 %
FEV6-%Pred-Post: 62 %
FEV6-%Pred-Pre: 61 %
FEV6-Post: 1.77 L
FEV6-Pre: 1.73 L
FEV6FVC-%Change-Post: 0 %
FEV6FVC-%Pred-Post: 104 %
FEV6FVC-%Pred-Pre: 104 %
FVC-%Change-Post: 1 %
FVC-%Pred-Post: 59 %
FVC-%Pred-Pre: 58 %
FVC-Post: 1.77 L
FVC-Pre: 1.74 L
Post FEV1/FVC ratio: 64 %
Post FEV6/FVC ratio: 100 %
Pre FEV1/FVC ratio: 64 %
Pre FEV6/FVC Ratio: 100 %

## 2020-04-18 NOTE — Addendum Note (Signed)
Addended by: Suzzanne Cloud E on: 04/18/2020 02:51 PM   Modules accepted: Orders

## 2020-04-18 NOTE — Progress Notes (Signed)
Full PFT performed today. °

## 2020-04-18 NOTE — Telephone Encounter (Signed)
Referral has been placed. 

## 2020-04-19 NOTE — Telephone Encounter (Signed)
Patient advised of referral

## 2020-04-24 ENCOUNTER — Ambulatory Visit (HOSPITAL_COMMUNITY)
Admission: RE | Admit: 2020-04-24 | Discharge: 2020-04-24 | Disposition: A | Discharge status: Home / Self Care | Payer: Medicare Other | Source: Ambulatory Visit | Attending: Acute Care | Admitting: Acute Care

## 2020-04-24 ENCOUNTER — Telehealth: Payer: Self-pay | Admitting: Pulmonary Disease

## 2020-04-24 ENCOUNTER — Other Ambulatory Visit: Payer: Self-pay

## 2020-04-24 DIAGNOSIS — J439 Emphysema, unspecified: Secondary | ICD-10-CM | POA: Insufficient documentation

## 2020-04-24 DIAGNOSIS — I2510 Atherosclerotic heart disease of native coronary artery without angina pectoris: Secondary | ICD-10-CM | POA: Insufficient documentation

## 2020-04-24 DIAGNOSIS — R911 Solitary pulmonary nodule: Secondary | ICD-10-CM | POA: Diagnosis not present

## 2020-04-24 DIAGNOSIS — I700 Atherosclerosis of aorta: Secondary | ICD-10-CM | POA: Insufficient documentation

## 2020-04-24 DIAGNOSIS — J432 Centrilobular emphysema: Secondary | ICD-10-CM | POA: Diagnosis not present

## 2020-04-24 LAB — GLUCOSE, CAPILLARY: Glucose-Capillary: 99 mg/dL (ref 70–99)

## 2020-04-24 MED ORDER — FLUDEOXYGLUCOSE F - 18 (FDG) INJECTION
5.5300 | Freq: Once | INTRAVENOUS | Status: AC
  Administered 2020-04-24: 5.53 via INTRAVENOUS

## 2020-04-24 NOTE — Telephone Encounter (Signed)
I did not call the patient today. I will call her tomorrow with the results of her scans.

## 2020-04-24 NOTE — Telephone Encounter (Signed)
Called and spoke with patient to let her know that as far as we could tell no one tried to reach out to her today. Informed her that we checked with Sarah to see if it was her and she stated it was not her but that once she got her final results from her scans she would call her. Patient expressed understanding. Nothing further needed at this time.

## 2020-04-24 NOTE — Telephone Encounter (Signed)
Spoke with the pt  She states that she received a call from our office today and accidentally deleted the voicemail that was left  I do not see that a call was placed to her today  [name]- I see that you ordered a PET and a CT that she pt had done today..did you call her by chance? Thanks!

## 2020-04-25 ENCOUNTER — Other Ambulatory Visit (HOSPITAL_COMMUNITY): Payer: Medicare Other

## 2020-04-25 ENCOUNTER — Ambulatory Visit (HOSPITAL_COMMUNITY): Payer: Medicare Other

## 2020-04-25 NOTE — Progress Notes (Signed)
These results have been called to the patient. She is aware the PET scan shows that the lung nodule in her left upper lobe is hypermetabolic, and is suspicious for bronchogenic carcinoma. She is scheduled to see Dr. Valeta Harms in the office 10/4, and she is scheduled for her biopsy on 10/5. She had no further questions at completion of the call.

## 2020-04-26 ENCOUNTER — Telehealth: Payer: Self-pay | Admitting: Pulmonary Disease

## 2020-04-26 NOTE — Telephone Encounter (Signed)
Patient has procedure Tuesday 10/5 and [facility] needs order for surgery and informed consent. Please advise.

## 2020-04-26 NOTE — Progress Notes (Signed)
I called these results to the patient . She verbalized understanding of the results. She has an appointment with Dr. Valeta Harms 10/4 2021, and is scheduled for biopsy 04/30/2020

## 2020-04-27 ENCOUNTER — Other Ambulatory Visit (HOSPITAL_COMMUNITY)
Admission: RE | Admit: 2020-04-27 | Discharge: 2020-04-27 | Disposition: A | Discharge status: Home / Self Care | Payer: Medicare Other | Source: Ambulatory Visit | Attending: Pulmonary Disease | Admitting: Pulmonary Disease

## 2020-04-27 DIAGNOSIS — Z01812 Encounter for preprocedural laboratory examination: Secondary | ICD-10-CM | POA: Insufficient documentation

## 2020-04-27 DIAGNOSIS — Z20822 Contact with and (suspected) exposure to covid-19: Secondary | ICD-10-CM | POA: Diagnosis not present

## 2020-04-27 LAB — SARS CORONAVIRUS 2 (TAT 6-24 HRS): SARS Coronavirus 2: NEGATIVE

## 2020-04-29 ENCOUNTER — Encounter (HOSPITAL_COMMUNITY): Payer: Self-pay | Admitting: Pulmonary Disease

## 2020-04-29 ENCOUNTER — Other Ambulatory Visit: Payer: Self-pay

## 2020-04-29 ENCOUNTER — Encounter: Payer: Self-pay | Admitting: Pulmonary Disease

## 2020-04-29 ENCOUNTER — Ambulatory Visit (INDEPENDENT_AMBULATORY_CARE_PROVIDER_SITE_OTHER): Payer: Medicare Other | Admitting: Pulmonary Disease

## 2020-04-29 VITALS — BP 130/80 | HR 82 | Temp 96.3°F | Ht 64.0 in | Wt 111.8 lb

## 2020-04-29 DIAGNOSIS — J432 Centrilobular emphysema: Secondary | ICD-10-CM | POA: Diagnosis not present

## 2020-04-29 DIAGNOSIS — J449 Chronic obstructive pulmonary disease, unspecified: Secondary | ICD-10-CM

## 2020-04-29 DIAGNOSIS — R942 Abnormal results of pulmonary function studies: Secondary | ICD-10-CM | POA: Diagnosis not present

## 2020-04-29 DIAGNOSIS — R911 Solitary pulmonary nodule: Secondary | ICD-10-CM

## 2020-04-29 MED ORDER — ALBUTEROL SULFATE HFA 108 (90 BASE) MCG/ACT IN AERS: 2.0000 | INHALATION_SPRAY | Freq: Four times a day (QID) | RESPIRATORY_TRACT | 3 refills | Status: DC | PRN

## 2020-04-29 MED ORDER — SPIRIVA RESPIMAT 2.5 MCG/ACT IN AERS: 2.0000 | INHALATION_SPRAY | Freq: Every day | RESPIRATORY_TRACT | 0 refills | Status: DC

## 2020-04-29 MED ORDER — SPIRIVA RESPIMAT 2.5 MCG/ACT IN AERS: 2.0000 | INHALATION_SPRAY | Freq: Every day | RESPIRATORY_TRACT | 1 refills | Status: DC

## 2020-04-29 NOTE — Progress Notes (Signed)
Synopsis: Referred in Oct 2021 for lung nodule by Debbrah Alar, NP  Subjective:   PATIENT ID: Renee Navarro GENDER: female DOB: Sep 05, 1945, MRN: [id number]  Chief Complaint  Patient presents with  . Consult    shortness of breath at rest and with exertion    74 yo FM, PMH COPD, former smoker, quit 7 years ago, enrolled in LDCT screening. New LUL nodule that was no present in comparison to previous images. Recent CT chest with LUL nodule 1.2 cm in largest cross section. She was referred to see me in consultation for videobronchoscopy, navigation and fiducial placement. She has severe COPD Stage 3 based on FEV1 50% predicted. She does not want to have lobectomy. I discussed with her at length about the options of surgery vs radiation therapy. She has opted to pursue radiation therapy if that is an option but needing to proceed with bx first. From a copd standpoint she occasionally uses albuterol and that's it. She is not on any maintence inhalers.    Past Medical History:  Diagnosis Date  . COPD (chronic obstructive pulmonary disease) ([place])   . Dyslipidemia    diet controlled  . Dyspnea    with exertion  . Lung nodule seen on imaging study   . Osteoporosis   . Pneumonia    x 1  . Tobacco user    quit 06/2012     Family History  Problem Relation Age of Onset  . Cancer Father        colon  . Diabetes Sister   . Hypertension Sister   . Lymphoma Brother      Past Surgical History:  Procedure Laterality Date  . colonoscopy    . TONSILLECTOMY    . TUBAL LIGATION    . WISDOM TOOTH EXTRACTION    . WRIST SURGERY Left     Social History   Socioeconomic History  . Marital status: Married    Spouse name: Not on file  . Number of children: Not on file  . Years of education: Not on file  . Highest education level: Not on file  Occupational History  . Occupation: Product manager: [employer]    Comment: [employer]  Tobacco Use  . Smoking status: Former  Smoker    Packs/day: 0.75    Years: 51.00    Pack years: 38.25    Types: Cigarettes    Quit date: 07/14/2012    Years since quitting: 7.7  . Smokeless tobacco: Never Used  Vaping Use  . Vaping Use: Never used  Substance and Sexual Activity  . Alcohol use: Yes    Comment: occ glass of wine  . Drug use: No  . Sexual activity: Not on file  Other Topics Concern  . Not on file  Social History Narrative   Caffeine Use: occasional   Regular exercise:  No         Social Determinants of Health   Financial Resource Strain:   . Difficulty of Paying Living Expenses: Not on file  Food Insecurity:   . Worried About Charity fundraiser in the Last Year: Not on file  . Ran Out of Food in the Last Year: Not on file  Transportation Needs:   . Lack of Transportation (Medical): Not on file  . Lack of Transportation (Non-Medical): Not on file  Physical Activity:   . Days of Exercise per Week: Not on file  . Minutes of Exercise per Session:  Not on file  Stress:   . Feeling of Stress : Not on file  Social Connections:   . Frequency of Communication with Friends and Family: Not on file  . Frequency of Social Gatherings with Friends and Family: Not on file  . Attends Religious Services: Not on file  . Active Member of Clubs or Organizations: Not on file  . Attends Archivist Meetings: Not on file  . Marital Status: Not on file  Intimate Partner Violence:   . Fear of Current or Ex-Partner: Not on file  . Emotionally Abused: Not on file  . Physically Abused: Not on file  . Sexually Abused: Not on file     Allergies  Allergen Reactions  . Budesonide-Formoterol Fumarate Cough     Outpatient Medications Prior to Visit  Medication Sig Dispense Refill  . albuterol (PROAIR HFA) 108 (90 Base) MCG/ACT inhaler Inhale 2 puffs into the lungs every 6 (six) hours as needed for shortness of breath. 6.7 g 5  . docusate sodium (COLACE) 100 MG capsule Take 200-300 mg by mouth at bedtime.     . clotrimazole-betamethasone (LOTRISONE) cream Apply 1 application topically 2 (two) times daily. (Patient not taking: Reported on 04/29/2020) 30 g 0  . fluconazole (DIFLUCAN) 150 MG tablet Take 1 tablet by mouth today. May repeat in 3 days if symptoms fail to improve. (Patient not taking: Reported on 04/18/2020) 1 tablet 0  . nystatin cream (MYCOSTATIN) Apply 1 application topically 2 (two) times daily. (Patient not taking: Reported on 04/18/2020) 30 g 1   No facility-administered medications prior to visit.    Review of Systems  Constitutional: Negative for chills, fever, malaise/fatigue and weight loss.  HENT: Negative for hearing loss, sore throat and tinnitus.   Eyes: Negative for blurred vision and double vision.  Respiratory: Positive for shortness of breath. Negative for cough, hemoptysis, sputum production, wheezing and stridor.   Cardiovascular: Negative for chest pain, palpitations, orthopnea, leg swelling and PND.  Gastrointestinal: Negative for abdominal pain, constipation, diarrhea, heartburn, nausea and vomiting.  Genitourinary: Negative for dysuria, hematuria and urgency.  Musculoskeletal: Negative for joint pain and myalgias.  Skin: Negative for itching and rash.  Neurological: Negative for dizziness, tingling, weakness and headaches.  Endo/Heme/Allergies: Negative for environmental allergies. Does not bruise/bleed easily.  Psychiatric/Behavioral: Negative for depression. The patient is nervous/anxious. The patient does not have insomnia.   All other systems reviewed and are negative.    Objective:  Physical Exam Vitals reviewed.  Constitutional:      General: She is not in acute distress.    Appearance: She is well-developed.  HENT:     Head: Normocephalic and atraumatic.     Mouth/Throat:     Pharynx: No oropharyngeal exudate.  Eyes:     Conjunctiva/sclera: Conjunctivae normal.     Pupils: Pupils are equal, round, and reactive to light.  Neck:     Vascular: No  JVD.     Trachea: No tracheal deviation.     Comments: Loss of supraclavicular fat Cardiovascular:     Rate and Rhythm: Normal rate and regular rhythm.     Heart sounds: S1 normal and S2 normal.     Comments: Distant heart tones Pulmonary:     Effort: No tachypnea or accessory muscle usage.     Breath sounds: No stridor. Decreased breath sounds (throughout all lung fields) present. No wheezing, rhonchi or rales.  Abdominal:     General: Bowel sounds are normal. There is no distension.  Palpations: Abdomen is soft.     Tenderness: There is no abdominal tenderness.  Musculoskeletal:        General: Deformity (muscle wasting ) present.  Skin:    General: Skin is warm and dry.     Capillary Refill: Capillary refill takes less than 2 seconds.     Findings: No rash.  Neurological:     Mental Status: She is alert and oriented to person, place, and time.  Psychiatric:        Behavior: Behavior normal.      Vitals:   04/29/20 1459  BP: 130/80  Pulse: 82  Temp: (!) 96.3 F (35.7 C)  TempSrc: Other (Comment)  SpO2: 98%  Weight: 111 lb 12.8 oz (50.7 kg)  Height: 5\' 4"  (1.626 m)   98% on RA BMI Readings from Last 3 Encounters:  04/29/20 19.19 kg/m  03/22/20 19.05 kg/m  10/02/19 19.07 kg/m   Wt Readings from Last 3 Encounters:  04/29/20 111 lb 12.8 oz (50.7 kg)  03/22/20 111 lb (50.3 kg)  10/02/19 111 lb 1.3 oz (50.4 kg)     CBC    Component Value Date/Time   WBC 6.7 04/18/2020 1451   RBC 5.04 04/18/2020 1451   HGB 15.1 (H) 04/18/2020 1451   HCT 45.3 04/18/2020 1451   PLT 270.0 04/18/2020 1451   MCV 90.0 04/18/2020 1451   MCHC 33.3 04/18/2020 1451   RDW 14.2 04/18/2020 1451   LYMPHSABS 1.5 11/19/2008 1026   MONOABS 0.4 11/19/2008 1026   EOSABS 0.1 11/19/2008 1026   BASOSABS 0.0 11/19/2008 1026     Chest Imaging: CT Chest 04/24/2020   Pulmonary Functions Testing Results: PFT Results Latest Ref Rng & Units 04/18/2020  FVC-Pre L 1.74  FVC-Predicted Pre  % 58  FVC-Post L 1.77  FVC-Predicted Post % 59  Pre FEV1/FVC % % 64  Post FEV1/FCV % % 64  FEV1-Pre L 1.12  FEV1-Predicted Pre % 50  FEV1-Post L 1.12  DLCO uncorrected ml/min/mmHg 13.35  DLCO UNC% % 68  DLCO corrected ml/min/mmHg 13.35  DLCO COR %Predicted % 68  DLVA Predicted % 95    FeNO:   Pathology:   Echocardiogram:   Heart Catheterization:     Assessment & Plan:     ICD-10-CM   1. Nodule of upper lobe of left lung  R91.1   2. Abnormal PET scan of lung  R94.2   3. Centrilobular emphysema ([place])  J43.2   4. Stage 2 moderate COPD by GOLD classification ([place])  J44.9     Discussion: This is a 74 year old female with enlarging left upper lobe pulmonary nodule concerning for primary bronchogenic carcinoma.  This has shown progression of disease as it has been followed with lung cancer screening.  She also has centrilobular emphysema and stage II COPD with a FEV1 of 50% predicted.  Currently not on maintenance inhalers with progressive symptoms.  She does have dyspnea on exertion with climbing stairs and walking back to the house in [place].  Plan Following Extensive Data Review & Interpretation:  . I reviewed prior external note(s) from primary care office note, Inda Castle, NP August 2021.  Elie Confer, NP lung cancer screening note. . I reviewed the result(s) of nuclear medicine pet imaging 04/24/2020 . I have ordered video bronchoscopy with endobronchial navigation with fiducial placement  Independent interpretation of tests . Review of patient's super D CT chest images revealed left upper lobe pulmonary nodule 8 x 9 mm in size.. The patient's images have been independently  reviewed by me.    Discussion of management Eric Form, NP from lung cancer screening program.  We will also discuss at medical thoracic oncology conference pending bronchoscopy results.  I will reach out to one of her radiation oncologist Dr. Sondra Come for consideration of SBRT pending bronchoscopy  results.   Current Outpatient Medications:  .  albuterol (PROAIR HFA) 108 (90 Base) MCG/ACT inhaler, Inhale 2 puffs into the lungs every 6 (six) hours as needed for shortness of breath., Disp: 6.7 g, Rfl: 5 .  docusate sodium (COLACE) 100 MG capsule, Take 200-300 mg by mouth at bedtime., Disp: , Rfl:   High level medical decision making was dedicated to the care of this patient on the date of this encounter to include pre-visit review of records, face-to-face time with the patient discussing conditions above, post visit ordering of testing, clinical documentation with the electronic health record, making appropriate referrals as documented, and communicating necessary findings to members of the patients care team.   Garner Nash, DO [facility] 04/29/2020 3:09 PM

## 2020-04-29 NOTE — Telephone Encounter (Signed)
Completed Garner Nash, DO [facility] 04/29/2020 7:43 AM

## 2020-04-29 NOTE — H&P (View-Only) (Signed)
Synopsis: Referred in Oct 2021 for lung nodule by Debbrah Alar, NP  Subjective:   PATIENT ID: Renee Navarro GENDER: female DOB: 05-03-1946, MRN: [id number]  Chief Complaint  Patient presents with  . Consult    shortness of breath at rest and with exertion    74 yo FM, PMH COPD, former smoker, quit 7 years ago, enrolled in LDCT screening. New LUL nodule that was no present in comparison to previous images. Recent CT chest with LUL nodule 1.2 cm in largest cross section. She was referred to see me in consultation for videobronchoscopy, navigation and fiducial placement. She has severe COPD Stage 3 based on FEV1 50% predicted. She does not want to have lobectomy. I discussed with her at length about the options of surgery vs radiation therapy. She has opted to pursue radiation therapy if that is an option but needing to proceed with bx first. From a copd standpoint she occasionally uses albuterol and that's it. She is not on any maintence inhalers.    Past Medical History:  Diagnosis Date  . COPD (chronic obstructive pulmonary disease) ([place])   . Dyslipidemia    diet controlled  . Dyspnea    with exertion  . Lung nodule seen on imaging study   . Osteoporosis   . Pneumonia    x 1  . Tobacco user    quit 06/2012     Family History  Problem Relation Age of Onset  . Cancer Father        colon  . Diabetes Sister   . Hypertension Sister   . Lymphoma Brother      Past Surgical History:  Procedure Laterality Date  . colonoscopy    . TONSILLECTOMY    . TUBAL LIGATION    . WISDOM TOOTH EXTRACTION    . WRIST SURGERY Left     Social History   Socioeconomic History  . Marital status: Married    Spouse name: Not on file  . Number of children: Not on file  . Years of education: Not on file  . Highest education level: Not on file  Occupational History  . Occupation: Product manager: [employer]    Comment: [employer]  Tobacco Use  . Smoking status: Former  Smoker    Packs/day: 0.75    Years: 51.00    Pack years: 38.25    Types: Cigarettes    Quit date: 07/14/2012    Years since quitting: 7.7  . Smokeless tobacco: Never Used  Vaping Use  . Vaping Use: Never used  Substance and Sexual Activity  . Alcohol use: Yes    Comment: occ glass of wine  . Drug use: No  . Sexual activity: Not on file  Other Topics Concern  . Not on file  Social History Narrative   Caffeine Use: occasional   Regular exercise:  No         Social Determinants of Health   Financial Resource Strain:   . Difficulty of Paying Living Expenses: Not on file  Food Insecurity:   . Worried About Charity fundraiser in the Last Year: Not on file  . Ran Out of Food in the Last Year: Not on file  Transportation Needs:   . Lack of Transportation (Medical): Not on file  . Lack of Transportation (Non-Medical): Not on file  Physical Activity:   . Days of Exercise per Week: Not on file  . Minutes of Exercise per Session:  Not on file  Stress:   . Feeling of Stress : Not on file  Social Connections:   . Frequency of Communication with Friends and Family: Not on file  . Frequency of Social Gatherings with Friends and Family: Not on file  . Attends Religious Services: Not on file  . Active Member of Clubs or Organizations: Not on file  . Attends Archivist Meetings: Not on file  . Marital Status: Not on file  Intimate Partner Violence:   . Fear of Current or Ex-Partner: Not on file  . Emotionally Abused: Not on file  . Physically Abused: Not on file  . Sexually Abused: Not on file     Allergies  Allergen Reactions  . Budesonide-Formoterol Fumarate Cough     Outpatient Medications Prior to Visit  Medication Sig Dispense Refill  . albuterol (PROAIR HFA) 108 (90 Base) MCG/ACT inhaler Inhale 2 puffs into the lungs every 6 (six) hours as needed for shortness of breath. 6.7 g 5  . docusate sodium (COLACE) 100 MG capsule Take 200-300 mg by mouth at bedtime.      . clotrimazole-betamethasone (LOTRISONE) cream Apply 1 application topically 2 (two) times daily. (Patient not taking: Reported on 04/29/2020) 30 g 0  . fluconazole (DIFLUCAN) 150 MG tablet Take 1 tablet by mouth today. May repeat in 3 days if symptoms fail to improve. (Patient not taking: Reported on 04/18/2020) 1 tablet 0  . nystatin cream (MYCOSTATIN) Apply 1 application topically 2 (two) times daily. (Patient not taking: Reported on 04/18/2020) 30 g 1   No facility-administered medications prior to visit.    Review of Systems  Constitutional: Negative for chills, fever, malaise/fatigue and weight loss.  HENT: Negative for hearing loss, sore throat and tinnitus.   Eyes: Negative for blurred vision and double vision.  Respiratory: Positive for shortness of breath. Negative for cough, hemoptysis, sputum production, wheezing and stridor.   Cardiovascular: Negative for chest pain, palpitations, orthopnea, leg swelling and PND.  Gastrointestinal: Negative for abdominal pain, constipation, diarrhea, heartburn, nausea and vomiting.  Genitourinary: Negative for dysuria, hematuria and urgency.  Musculoskeletal: Negative for joint pain and myalgias.  Skin: Negative for itching and rash.  Neurological: Negative for dizziness, tingling, weakness and headaches.  Endo/Heme/Allergies: Negative for environmental allergies. Does not bruise/bleed easily.  Psychiatric/Behavioral: Negative for depression. The patient is nervous/anxious. The patient does not have insomnia.   All other systems reviewed and are negative.    Objective:  Physical Exam Vitals reviewed.  Constitutional:      General: She is not in acute distress.    Appearance: She is well-developed.  HENT:     Head: Normocephalic and atraumatic.     Mouth/Throat:     Pharynx: No oropharyngeal exudate.  Eyes:     Conjunctiva/sclera: Conjunctivae normal.     Pupils: Pupils are equal, round, and reactive to light.  Neck:     Vascular: No  JVD.     Trachea: No tracheal deviation.     Comments: Loss of supraclavicular fat Cardiovascular:     Rate and Rhythm: Normal rate and regular rhythm.     Heart sounds: S1 normal and S2 normal.     Comments: Distant heart tones Pulmonary:     Effort: No tachypnea or accessory muscle usage.     Breath sounds: No stridor. Decreased breath sounds (throughout all lung fields) present. No wheezing, rhonchi or rales.  Abdominal:     General: Bowel sounds are normal. There is no distension.  Palpations: Abdomen is soft.     Tenderness: There is no abdominal tenderness.  Musculoskeletal:        General: Deformity (muscle wasting ) present.  Skin:    General: Skin is warm and dry.     Capillary Refill: Capillary refill takes less than 2 seconds.     Findings: No rash.  Neurological:     Mental Status: She is alert and oriented to person, place, and time.  Psychiatric:        Behavior: Behavior normal.      Vitals:   04/29/20 1459  BP: 130/80  Pulse: 82  Temp: (!) 96.3 F (35.7 C)  TempSrc: Other (Comment)  SpO2: 98%  Weight: 111 lb 12.8 oz (50.7 kg)  Height: 5\' 4"  (1.626 m)   98% on RA BMI Readings from Last 3 Encounters:  04/29/20 19.19 kg/m  03/22/20 19.05 kg/m  10/02/19 19.07 kg/m   Wt Readings from Last 3 Encounters:  04/29/20 111 lb 12.8 oz (50.7 kg)  03/22/20 111 lb (50.3 kg)  10/02/19 111 lb 1.3 oz (50.4 kg)     CBC    Component Value Date/Time   WBC 6.7 04/18/2020 1451   RBC 5.04 04/18/2020 1451   HGB 15.1 (H) 04/18/2020 1451   HCT 45.3 04/18/2020 1451   PLT 270.0 04/18/2020 1451   MCV 90.0 04/18/2020 1451   MCHC 33.3 04/18/2020 1451   RDW 14.2 04/18/2020 1451   LYMPHSABS 1.5 11/19/2008 1026   MONOABS 0.4 11/19/2008 1026   EOSABS 0.1 11/19/2008 1026   BASOSABS 0.0 11/19/2008 1026     Chest Imaging: CT Chest 04/24/2020   Pulmonary Functions Testing Results: PFT Results Latest Ref Rng & Units 04/18/2020  FVC-Pre L 1.74  FVC-Predicted Pre  % 58  FVC-Post L 1.77  FVC-Predicted Post % 59  Pre FEV1/FVC % % 64  Post FEV1/FCV % % 64  FEV1-Pre L 1.12  FEV1-Predicted Pre % 50  FEV1-Post L 1.12  DLCO uncorrected ml/min/mmHg 13.35  DLCO UNC% % 68  DLCO corrected ml/min/mmHg 13.35  DLCO COR %Predicted % 68  DLVA Predicted % 95    FeNO:   Pathology:   Echocardiogram:   Heart Catheterization:     Assessment & Plan:     ICD-10-CM   1. Nodule of upper lobe of left lung  R91.1   2. Abnormal PET scan of lung  R94.2   3. Centrilobular emphysema ([place])  J43.2   4. Stage 2 moderate COPD by GOLD classification ([place])  J44.9     Discussion: This is a 74 year old female with enlarging left upper lobe pulmonary nodule concerning for primary bronchogenic carcinoma.  This has shown progression of disease as it has been followed with lung cancer screening.  She also has centrilobular emphysema and stage II COPD with a FEV1 of 50% predicted.  Currently not on maintenance inhalers with progressive symptoms.  She does have dyspnea on exertion with climbing stairs and walking back to the house in [place].  Plan Following Extensive Data Review & Interpretation:  . I reviewed prior external note(s) from primary care office note, Inda Castle, NP August 2021.  Elie Confer, NP lung cancer screening note. . I reviewed the result(s) of nuclear medicine pet imaging 04/24/2020 . I have ordered video bronchoscopy with endobronchial navigation with fiducial placement  Independent interpretation of tests . Review of patient's super D CT chest images revealed left upper lobe pulmonary nodule 8 x 9 mm in size.. The patient's images have been independently  reviewed by me.    Discussion of management Eric Form, NP from lung cancer screening program.  We will also discuss at medical thoracic oncology conference pending bronchoscopy results.  I will reach out to one of her radiation oncologist Dr. Sondra Come for consideration of SBRT pending bronchoscopy  results.   Current Outpatient Medications:  .  albuterol (PROAIR HFA) 108 (90 Base) MCG/ACT inhaler, Inhale 2 puffs into the lungs every 6 (six) hours as needed for shortness of breath., Disp: 6.7 g, Rfl: 5 .  docusate sodium (COLACE) 100 MG capsule, Take 200-300 mg by mouth at bedtime., Disp: , Rfl:   High level medical decision making was dedicated to the care of this patient on the date of this encounter to include pre-visit review of records, face-to-face time with the patient discussing conditions above, post visit ordering of testing, clinical documentation with the electronic health record, making appropriate referrals as documented, and communicating necessary findings to members of the patients care team.   Garner Nash, DO [facility] 04/29/2020 3:09 PM

## 2020-04-29 NOTE — Patient Instructions (Addendum)
Thank you for visiting Dr. Valeta Harms at [facility]. Today we recommend the following:  Bronchoscopy planned for tomorrow.   Samples of 2.5 Spiriva respimat 2 puffs once daily  Refills of albuterol   Return in about 4 weeks (around 05/27/2020) for w/ Dr. Valeta Harms, (can be televisit) .    Please do your part to reduce the spread of COVID-19.

## 2020-04-29 NOTE — Progress Notes (Signed)
Patient denies shortness of breath, fever, cough or chest pain.  PCP - Debbrah Alar, NP Cardiologist - n/a  Chest x-ray - n/a - CT Chest 04/24/20 EKG - 09/26/19 Stress Test - n/a ECHO - n/a Cardiac Cath - n/a  STOP now taking any Aspirin (unless otherwise instructed by your surgeon), Aleve, Naproxen, Ibuprofen, Motrin, Advil, Goody's, BC's, all herbal medications, fish oil, and all vitamins.   Coronavirus Screening Covid test on 04/27/20 was negative  Patient verbalized understanding of instructions that were given via phone.

## 2020-04-30 ENCOUNTER — Ambulatory Visit (HOSPITAL_COMMUNITY)
Admission: RE | Admit: 2020-04-30 | Discharge: 2020-04-30 | Disposition: A | Discharge status: Home / Self Care | Payer: Medicare Other | Source: Ambulatory Visit | Attending: Pulmonary Disease | Admitting: Pulmonary Disease

## 2020-04-30 ENCOUNTER — Encounter (HOSPITAL_COMMUNITY): Payer: Self-pay | Admitting: Pulmonary Disease

## 2020-04-30 ENCOUNTER — Encounter: Payer: Self-pay | Admitting: *Deleted

## 2020-04-30 ENCOUNTER — Ambulatory Visit (HOSPITAL_COMMUNITY): Payer: Medicare Other

## 2020-04-30 ENCOUNTER — Ambulatory Visit (HOSPITAL_COMMUNITY): Payer: Medicare Other | Admitting: Certified Registered Nurse Anesthetist

## 2020-04-30 ENCOUNTER — Other Ambulatory Visit: Payer: Self-pay

## 2020-04-30 ENCOUNTER — Encounter (HOSPITAL_COMMUNITY)
Admission: RE | Disposition: A | Discharge status: Home / Self Care | Payer: Self-pay | Source: Ambulatory Visit | Attending: Pulmonary Disease

## 2020-04-30 DIAGNOSIS — Z9889 Other specified postprocedural states: Secondary | ICD-10-CM

## 2020-04-30 DIAGNOSIS — Z800 Family history of malignant neoplasm of digestive organs: Secondary | ICD-10-CM | POA: Insufficient documentation

## 2020-04-30 DIAGNOSIS — R911 Solitary pulmonary nodule: Secondary | ICD-10-CM

## 2020-04-30 DIAGNOSIS — R0602 Shortness of breath: Secondary | ICD-10-CM | POA: Insufficient documentation

## 2020-04-30 DIAGNOSIS — Z807 Family history of other malignant neoplasms of lymphoid, hematopoietic and related tissues: Secondary | ICD-10-CM | POA: Diagnosis not present

## 2020-04-30 DIAGNOSIS — J449 Chronic obstructive pulmonary disease, unspecified: Secondary | ICD-10-CM | POA: Insufficient documentation

## 2020-04-30 DIAGNOSIS — C3412 Malignant neoplasm of upper lobe, left bronchus or lung: Secondary | ICD-10-CM | POA: Diagnosis not present

## 2020-04-30 DIAGNOSIS — M810 Age-related osteoporosis without current pathological fracture: Secondary | ICD-10-CM | POA: Diagnosis not present

## 2020-04-30 DIAGNOSIS — Z87891 Personal history of nicotine dependence: Secondary | ICD-10-CM | POA: Insufficient documentation

## 2020-04-30 DIAGNOSIS — I4510 Unspecified right bundle-branch block: Secondary | ICD-10-CM | POA: Diagnosis not present

## 2020-04-30 DIAGNOSIS — E785 Hyperlipidemia, unspecified: Secondary | ICD-10-CM | POA: Diagnosis not present

## 2020-04-30 DIAGNOSIS — Z8249 Family history of ischemic heart disease and other diseases of the circulatory system: Secondary | ICD-10-CM | POA: Insufficient documentation

## 2020-04-30 DIAGNOSIS — Z833 Family history of diabetes mellitus: Secondary | ICD-10-CM | POA: Insufficient documentation

## 2020-04-30 DIAGNOSIS — I517 Cardiomegaly: Secondary | ICD-10-CM | POA: Diagnosis not present

## 2020-04-30 DIAGNOSIS — R9431 Abnormal electrocardiogram [ECG] [EKG]: Secondary | ICD-10-CM | POA: Diagnosis not present

## 2020-04-30 HISTORY — DX: Pneumonia, unspecified organism: J18.9

## 2020-04-30 HISTORY — PX: BRONCHIAL NEEDLE ASPIRATION BIOPSY: SHX5106

## 2020-04-30 HISTORY — PX: BRONCHIAL WASHINGS: SHX5105

## 2020-04-30 HISTORY — PX: FIDUCIAL MARKER PLACEMENT: SHX6858

## 2020-04-30 HISTORY — PX: VIDEO BRONCHOSCOPY WITH ENDOBRONCHIAL NAVIGATION: SHX6175

## 2020-04-30 HISTORY — PX: BRONCHIAL BIOPSY: SHX5109

## 2020-04-30 HISTORY — PX: BRONCHIAL BRUSHINGS: SHX5108

## 2020-04-30 HISTORY — DX: Dyspnea, unspecified: R06.00

## 2020-04-30 LAB — COMPREHENSIVE METABOLIC PANEL
ALT: 20 U/L (ref 0–44)
AST: 20 U/L (ref 15–41)
Albumin: 4.1 g/dL (ref 3.5–5.0)
Alkaline Phosphatase: 49 U/L (ref 38–126)
Anion gap: 10 (ref 5–15)
BUN: 7 mg/dL — ABNORMAL LOW (ref 8–23)
CO2: 27 mmol/L (ref 22–32)
Calcium: 9.2 mg/dL (ref 8.9–10.3)
Chloride: 102 mmol/L (ref 98–111)
Creatinine, Ser: 0.68 mg/dL (ref 0.44–1.00)
GFR calc non Af Amer: >60 mL/min (ref >60)
Glucose, Bld: 114 mg/dL — ABNORMAL HIGH (ref 70–99)
Potassium: 4.1 mmol/L (ref 3.5–5.1)
Sodium: 139 mmol/L (ref 135–145)
Total Bilirubin: 0.8 mg/dL (ref 0.3–1.2)
Total Protein: 6.9 g/dL (ref 6.5–8.1)

## 2020-04-30 LAB — PROTIME-INR
INR: 1 (ref 0.8–1.2)
Prothrombin Time: 13.1 seconds (ref 11.4–15.2)

## 2020-04-30 LAB — APTT: aPTT: 28 seconds (ref 24–36)

## 2020-04-30 SURGERY — VIDEO BRONCHOSCOPY WITH ENDOBRONCHIAL NAVIGATION
Anesthesia: General

## 2020-04-30 MED ORDER — PROPOFOL 10 MG/ML IV BOLUS
INTRAVENOUS | Status: DC | PRN
  Administered 2020-04-30: 130 mg via INTRAVENOUS

## 2020-04-30 MED ORDER — DEXAMETHASONE SODIUM PHOSPHATE 10 MG/ML IJ SOLN
INTRAMUSCULAR | Status: DC | PRN
  Administered 2020-04-30: 4 mg via INTRAVENOUS

## 2020-04-30 MED ORDER — ROCURONIUM BROMIDE 10 MG/ML (PF) SYRINGE
PREFILLED_SYRINGE | INTRAVENOUS | Status: DC | PRN
  Administered 2020-04-30: 40 mg via INTRAVENOUS

## 2020-04-30 MED ORDER — PHENYLEPHRINE HCL-NACL 10-0.9 MG/250ML-% IV SOLN
INTRAVENOUS | Status: DC | PRN
  Administered 2020-04-30: 50 ug/min via INTRAVENOUS

## 2020-04-30 MED ORDER — CHLORHEXIDINE GLUCONATE 0.12 % MT SOLN
15.0000 mL | Freq: Once | OROMUCOSAL | Status: AC
  Administered 2020-04-30: 15 mL via OROMUCOSAL
  Filled 2020-04-30 (×2): qty 15

## 2020-04-30 MED ORDER — LACTATED RINGERS IV SOLN: INTRAVENOUS | Status: DC

## 2020-04-30 MED ORDER — ONDANSETRON HCL 4 MG/2ML IJ SOLN
INTRAMUSCULAR | Status: DC | PRN
  Administered 2020-04-30: 4 mg via INTRAVENOUS

## 2020-04-30 MED ORDER — LIDOCAINE 2% (20 MG/ML) 5 ML SYRINGE
INTRAMUSCULAR | Status: DC | PRN
  Administered 2020-04-30: 50 mg via INTRAVENOUS

## 2020-04-30 MED ORDER — FENTANYL CITRATE (PF) 250 MCG/5ML IJ SOLN
INTRAMUSCULAR | Status: DC | PRN
Start: 2020-04-30 — End: 2020-04-30
  Administered 2020-04-30 (×2): 50 ug via INTRAVENOUS

## 2020-04-30 SURGICAL SUPPLY — 48 items
ADAPTER BRONCH F/PENTAX (ADAPTER) ×4 IMPLANT
ADAPTER VALVE BIOPSY EBUS (MISCELLANEOUS) IMPLANT
ADPR BSCP EDG PNTX (ADAPTER) ×2
ADPTR VALVE BIOPSY EBUS (MISCELLANEOUS)
BRUSH CYTOL CELLEBRITY 1.5X140 (MISCELLANEOUS) ×4 IMPLANT
BRUSH SUPERTRAX BIOPSY (INSTRUMENTS) IMPLANT
BRUSH SUPERTRAX NDL-TIP CYTO (INSTRUMENTS) ×4 IMPLANT
CANISTER SUCT 3000ML PPV (MISCELLANEOUS) ×4 IMPLANT
CHANNEL WORK EXTEND EDGE 180 (KITS) IMPLANT
CHANNEL WORK EXTEND EDGE 45 (KITS) IMPLANT
CHANNEL WORK EXTEND EDGE 90 (KITS) IMPLANT
CONT SPEC 4OZ CLIKSEAL STRL BL (MISCELLANEOUS) ×4 IMPLANT
COVER BACK TABLE 60X90IN (DRAPES) ×4 IMPLANT
FILTER STRAW FLUID ASPIR (MISCELLANEOUS) IMPLANT
FORCEPS BIOP SUPERTRX PREMAR (INSTRUMENTS) ×4 IMPLANT
GAUZE SPONGE 4X4 12PLY STRL (GAUZE/BANDAGES/DRESSINGS) ×4 IMPLANT
GLOVE SURG SS PI 7.5 STRL IVOR (GLOVE) ×8 IMPLANT
GOWN STRL REUS W/ TWL LRG LVL3 (GOWN DISPOSABLE) ×4 IMPLANT
GOWN STRL REUS W/TWL LRG LVL3 (GOWN DISPOSABLE) ×8
KIT CLEAN ENDO COMPLIANCE (KITS) ×4 IMPLANT
KIT LOCATABLE GUIDE (CANNULA) IMPLANT
KIT MARKER FIDUCIAL DELIVERY (KITS) IMPLANT
KIT PROCEDURE EDGE 180 (KITS) IMPLANT
KIT PROCEDURE EDGE 45 (KITS) IMPLANT
KIT PROCEDURE EDGE 90 (KITS) IMPLANT
KIT TURNOVER KIT B (KITS) ×4 IMPLANT
MARKER SKIN DUAL TIP RULER LAB (MISCELLANEOUS) ×4 IMPLANT
NDL SUPERTRX PREMARK BIOPSY (NEEDLE) ×2 IMPLANT
NEEDLE SUPERTRX PREMARK BIOPSY (NEEDLE) ×4 IMPLANT
NS IRRIG 1000ML POUR BTL (IV SOLUTION) ×4 IMPLANT
OIL SILICONE PENTAX (PARTS (SERVICE/REPAIRS)) ×4 IMPLANT
PAD ARMBOARD 7.5X6 YLW CONV (MISCELLANEOUS) ×8 IMPLANT
PATCHES PATIENT (LABEL) ×12 IMPLANT
SOL ANTI FOG 6CC (MISCELLANEOUS) ×2 IMPLANT
SOLUTION ANTI FOG 6CC (MISCELLANEOUS) ×2
SYR 20CC LL (SYRINGE) ×4 IMPLANT
SYR 20ML ECCENTRIC (SYRINGE) ×4 IMPLANT
SYR 50ML SLIP (SYRINGE) ×4 IMPLANT
Super lock fiducial marker ×6 IMPLANT
TOWEL OR 17X24 6PK STRL BLUE (TOWEL DISPOSABLE) ×4 IMPLANT
TRAP SPECIMEN MUCOUS 40CC (MISCELLANEOUS) IMPLANT
TUBE CONNECTING 20'X1/4 (TUBING) ×1
TUBE CONNECTING 20X1/4 (TUBING) ×3 IMPLANT
UNDERPAD 30X30 (UNDERPADS AND DIAPERS) ×4 IMPLANT
VALVE BIOPSY  SINGLE USE (MISCELLANEOUS) ×4
VALVE BIOPSY SINGLE USE (MISCELLANEOUS) ×2 IMPLANT
VALVE SUCTION BRONCHIO DISP (MISCELLANEOUS) ×4 IMPLANT
WATER STERILE IRR 1000ML POUR (IV SOLUTION) ×4 IMPLANT

## 2020-04-30 NOTE — Op Note (Signed)
Video Bronchoscopy with Electromagnetic Navigation with fiducial marking procedure Note  Date of Operation: 04/30/2020  Pre-op Diagnosis: Left upper lobe lung nodule  Post-op Diagnosis: Left upper lobe lung nodule  Surgeon: Garner Nash, DO   Assistants: none   Anesthesia: General endotracheal anesthesia  Operation: Flexible video fiberoptic bronchoscopy with electromagnetic navigation and biopsies.  Estimated Blood Loss: Minimal, <8FO  Complications: None   Indications and History: Renee Navarro is a 74 y.o. female with abnormal PET scan, abnormal CT scan, left upper lobe lung nodule.  The risks, benefits, complications, treatment options and expected outcomes were discussed with the patient.  The possibilities of pneumothorax, pneumonia, reaction to medication, pulmonary aspiration, perforation of a viscus, bleeding, failure to diagnose a condition and creating a complication requiring transfusion or operation were discussed with the patient who freely signed the consent.    Description of Procedure: The patient was seen in the Preoperative Area, was examined and was deemed appropriate to proceed.  The patient was taken to [facility] endoscopy room 2, identified as Renee Navarro and the procedure verified as Flexible Video Fiberoptic Bronchoscopy.  A Time Out was held and the above information confirmed.   Prior to the date of the procedure a high-resolution CT scan of the chest was performed. Utilizing Rolla a virtual tracheobronchial tree was generated to allow the creation of distinct navigation pathways to the patient's parenchymal abnormalities. After being taken to the operating room general anesthesia was initiated and the patient  was orally intubated. The video fiberoptic bronchoscope was introduced via the endotracheal tube and a general inspection was performed which showed normal right and left lung anatomy with no evidence of endobronchial lesion, scattered distal  bronchiectatic openings and pitting. The extendable working channel and locator guide were introduced into the bronchoscope. The distinct navigation pathways prepared prior to this procedure were then utilized to navigate to within 1 cm of patient's lesion(s) identified on CT scan.  A full fluoroscopic sweep for local registration was completed, inspiratory breath-hold with APL at 25, sweep from LAO 25 degrees to RAO 25 degrees. The extendable working channel was secured into place and the locator guide was withdrawn. Under fluoroscopic guidance transbronchial needle brushings, transbronchial Wang needle biopsies, and transbronchial forceps biopsies were performed to be sent for cytology and pathology.  Following tissue sampling be locatable guide was positioned with in 3 axial planes around to the lesion at approximately 2 cm away from lesion center and 3 separate fiducial markers were placed.  A bronchioalveolar lavage was performed in the left upper lobe and sent for cytology. At the end of the procedure a general airway inspection was performed and there was no evidence of active bleeding.  The therapeutic bronchoscope was used for suctioning and clearance of the bilateral mainstem's of blood clots, secretions and debris.  The bronchoscope was brought to just above the main carina and there was no evidence of active bleeding.  The bronchoscope was removed.  The patient tolerated the procedure well. There was no significant blood loss and there were no obvious complications. A post-procedural chest x-ray is pending.  Samples: 1. Transbronchial needle brushings from left upper lobe 2. Transbronchial Wang needle biopsies from the upper lobe 3. Transbronchial forceps biopsies from the left upper lobe 4. Bronchoalveolar lavage from the left upper lobe  Preliminary pathology: Read as adequate for tissue diagnosis  Plans:  The patient will be discharged from the PACU to home when recovered from anesthesia  and after chest  x-ray is reviewed. We will review the cytology, pathology results with the patient when they become available. Outpatient followup will be with Garner Nash, DO.   Garner Nash, DO [facility] 04/30/2020 1:05 PM

## 2020-04-30 NOTE — Anesthesia Preprocedure Evaluation (Addendum)
Anesthesia Evaluation  Patient identified by MRN, date of birth, ID band Patient awake    Reviewed: Allergy & Precautions, NPO status , Patient's Chart, lab work & pertinent test results  Airway Mallampati: II  TM Distance: >3 FB Neck ROM: Full    Dental  (+) Caps, Missing, Partial Lower, Dental Advisory Given,    Pulmonary shortness of breath and with exertion, pneumonia, resolved, COPD,  COPD inhaler, former smoker,  LUL lung nodule   Pulmonary exam normal breath sounds clear to auscultation       Cardiovascular Normal cardiovascular exam+ dysrhythmias  Rhythm:Regular Rate:Normal  EKG 09/26/19 Incomplete RBBB   Neuro/Psych negative neurological ROS  negative psych ROS   GI/Hepatic negative GI ROS, Neg liver ROS,   Endo/Other  negative endocrine ROS  Renal/GU negative Renal ROS  negative genitourinary   Musculoskeletal negative musculoskeletal ROS (+)   Abdominal   Peds  Hematology negative hematology ROS (+)   Anesthesia Other Findings   Reproductive/Obstetrics                            Anesthesia Physical Anesthesia Plan  ASA: III  Anesthesia Plan: General   Post-op Pain Management:    Induction: Intravenous  PONV Risk Score and Plan: 3 and Treatment may vary due to age or medical condition and Ondansetron  Airway Management Planned: Oral ETT  Additional Equipment:   Intra-op Plan:   Post-operative Plan: Extubation in OR  Informed Consent: I have reviewed the patients History and Physical, chart, labs and discussed the procedure including the risks, benefits and alternatives for the proposed anesthesia with the patient or authorized representative who has indicated his/her understanding and acceptance.     Dental advisory given  Plan Discussed with: CRNA and Anesthesiologist  Anesthesia Plan Comments:         Anesthesia Quick Evaluation

## 2020-04-30 NOTE — Transfer of Care (Signed)
Immediate Anesthesia Transfer of Care Note  Patient: Renee Navarro  Procedure(s) Performed: VIDEO BRONCHOSCOPY WITH ENDOBRONCHIAL NAVIGATION (N/A ) BRONCHIAL BIOPSIES BRONCHIAL BRUSHINGS BRONCHIAL NEEDLE ASPIRATION BIOPSIES BRONCHIAL WASHINGS FIDUCIAL MARKER PLACEMENT  Patient Location: PACU and Endoscopy Unit  Anesthesia Type:General  Level of Consciousness: drowsy and patient cooperative  Airway & Oxygen Therapy: Patient Spontanous Breathing and Patient connected to face mask oxygen  Post-op Assessment: Report given to RN and Post -op Vital signs reviewed and stable  Post vital signs: Reviewed and stable  Last Vitals:  Vitals Value Taken Time  BP    Temp    Pulse    Resp    SpO2      Last Pain:  Vitals:   04/30/20 0953  TempSrc:   PainSc: 0-No pain      Patients Stated Pain Goal: 3 (41/28/78 6767)  Complications: No complications documented.

## 2020-04-30 NOTE — Progress Notes (Signed)
I received referral on Renee Navarro today.  I updated thoracic surgery office that patient needs to be set up with Dr. Kipp Brood.

## 2020-04-30 NOTE — Anesthesia Postprocedure Evaluation (Signed)
Anesthesia Post Note  Patient: Renee Navarro  Procedure(s) Performed: VIDEO BRONCHOSCOPY WITH ENDOBRONCHIAL NAVIGATION (N/A ) BRONCHIAL BIOPSIES BRONCHIAL BRUSHINGS BRONCHIAL NEEDLE ASPIRATION BIOPSIES BRONCHIAL WASHINGS FIDUCIAL MARKER PLACEMENT     Patient location during evaluation: PACU Anesthesia Type: General Level of consciousness: awake and alert and oriented Pain management: pain level controlled Vital Signs Assessment: post-procedure vital signs reviewed and stable Respiratory status: spontaneous breathing, nonlabored ventilation and respiratory function stable Cardiovascular status: blood pressure returned to baseline and stable Postop Assessment: no apparent nausea or vomiting Anesthetic complications: no   No complications documented.  Last Vitals:  Vitals:   04/30/20 1345 04/30/20 1355  BP: 131/64 126/70  Pulse: 60 (!) 58  Resp: 19 16  Temp:    SpO2: 94% 96%    Last Pain:  Vitals:   04/30/20 1355  TempSrc:   PainSc: 0-No pain                 Francesco Provencal A.

## 2020-04-30 NOTE — Discharge Instructions (Signed)
Flexible Bronchoscopy, Care After This sheet gives you information about how to care for yourself after your test. Your doctor may also give you more specific instructions. If you have problems or questions, contact your doctor. Follow these instructions at home: Eating and drinking  The day after the test, go back to your normal diet. Driving  Do not drive for 24 hours if you were given a medicine to help you relax (sedative).  Do not drive or use heavy machinery while taking prescription pain medicine. General instructions   Take over-the-counter and prescription medicines only as told by your doctor.  Return to your normal activities as told. Ask what activities are safe for you.  Do not use any products that have nicotine or tobacco in them. This includes cigarettes and e-cigarettes. If you need help quitting, ask your doctor.  Keep all follow-up visits as told by your doctor. This is important. It is very important if you had a tissue sample (biopsy) taken. Get help right away if:  You have shortness of breath that gets worse.  You get light-headed.  You feel like you are going to pass out (faint).  You have chest pain.  You cough up: ? More than a little blood. ? More blood than before. Summary  Do not eat or drink anything (not even water) for 2 hours after your test, or until your numbing medicine wears off.  Do not use cigarettes. Do not use e-cigarettes.  Get help right away if you have chest pain. This information is not intended to replace advice given to you by your health care provider. Make sure you discuss any questions you have with your health care provider. Document Revised: 06/25/2017 Document Reviewed: 07/31/2016 Elsevier Patient Education  2020 [employer].

## 2020-04-30 NOTE — Anesthesia Procedure Notes (Signed)
Procedure Name: Intubation Date/Time: 04/30/2020 12:00 PM Performed by: Renato Shin, CRNA Pre-anesthesia Checklist: Patient identified, Emergency Drugs available, Suction available and Patient being monitored Patient Re-evaluated:Patient Re-evaluated prior to induction Oxygen Delivery Method: Circle system utilized Preoxygenation: Pre-oxygenation with 100% oxygen Induction Type: IV induction Ventilation: Mask ventilation without difficulty Laryngoscope Size: Miller and 2 Grade View: Grade I Tube size: 8.0 mm Number of attempts: 1 Airway Equipment and Method: Stylet Placement Confirmation: ETT inserted through vocal cords under direct vision,  positive ETCO2 and breath sounds checked- equal and bilateral Secured at: 21 cm Tube secured with: Tape Dental Injury: Teeth and Oropharynx as per pre-operative assessment

## 2020-04-30 NOTE — Interval H&P Note (Signed)
History and Physical Interval Note:  04/30/2020 1:00 PM  Renee Navarro  has presented today for surgery, with the diagnosis of LUNG NODULE.  The various methods of treatment have been discussed with the patient and family. After consideration of risks, benefits and other options for treatment, the patient has consented to  Procedure(s): VIDEO BRONCHOSCOPY WITH ENDOBRONCHIAL NAVIGATION (N/A) BRONCHIAL BIOPSIES BRONCHIAL BRUSHINGS BRONCHIAL NEEDLE ASPIRATION BIOPSIES BRONCHIAL WASHINGS FIDUCIAL MARKER PLACEMENT as a surgical intervention.  The patient's history has been reviewed, patient examined, no change in status, stable for surgery.  I have reviewed the patient's chart and labs.  Questions were answered to the patient's satisfaction.     [place]

## 2020-05-01 ENCOUNTER — Telehealth: Payer: Self-pay | Admitting: Pulmonary Disease

## 2020-05-01 DIAGNOSIS — L309 Dermatitis, unspecified: Secondary | ICD-10-CM | POA: Diagnosis not present

## 2020-05-01 LAB — CYTOLOGY - NON PAP

## 2020-05-01 NOTE — Telephone Encounter (Signed)
PCCM:  I attempted to call the patient to review her pathology results. They came back this afternoon. + Squamous cell carcinoma.   Eventhough she is interested in radiation therapy I believe she should meet with the cardiothoracic surgeon to discuss the pros and cons of resection. That is their specialty and the standard of care for stage 1 lung cancer is resection if possible.   I am working nights and will try to call her again later or in the AM.   Thanks  Garner Nash, [facility] 05/01/2020 8:31 PM

## 2020-05-01 NOTE — Telephone Encounter (Signed)
Spoke with pt who stated she did not understand why an appointment was made with Dr. Kipp Brood during her OV 04/29/20. Pt stated that from her understanding Dr. Valeta Harms was leaning more toward radiation vs surgery.  Dr. Valeta Harms please advise. Thank you

## 2020-05-02 NOTE — Telephone Encounter (Signed)
Spoke with Renee Navarro and reviewed Dr. Juline Patch response to her questions. Renee Navarro stated understanding and looked forward to hearing from Dr. Valeta Harms himself. Nothing further needed at this time.

## 2020-05-03 ENCOUNTER — Telehealth: Payer: Self-pay | Admitting: Pulmonary Disease

## 2020-05-03 ENCOUNTER — Telehealth: Payer: Self-pay | Admitting: *Deleted

## 2020-05-03 ENCOUNTER — Encounter (HOSPITAL_COMMUNITY): Payer: Self-pay | Admitting: Pulmonary Disease

## 2020-05-03 NOTE — Telephone Encounter (Signed)
Spoke with the pt  She states that Dr Valeta Harms called her a few minutes ago and he answered her question  Nothing further needed

## 2020-05-03 NOTE — Telephone Encounter (Signed)
I received a message from Dr. Valeta Harms that patient would like to discuss radiation treatment with Dr. Sondra Come.  I called her and scheduled her to be seen at [facility] next week.  She agreed with appt but may have to change.  She will call me back if she can't make it.

## 2020-05-03 NOTE — Telephone Encounter (Signed)
I will schedule Ms. Julson for [place] to see Dr. Sondra Come next week.

## 2020-05-03 NOTE — Telephone Encounter (Signed)
PCCM:   I called Ms. Younan and told her about her pathology results. She has an appt with Dr. Kipp Brood. She is willing to talk with surgery about lobectomy but is scared. She would like to speak with radiation oncology to discuss SBRt before making a final decision.   CC: Clair Gulling, maybe you can get her in to discuss as well.    Garner Nash, DO [facility] 05/03/2020 12:14 PM

## 2020-05-04 DIAGNOSIS — Z23 Encounter for immunization: Secondary | ICD-10-CM | POA: Diagnosis not present

## 2020-05-09 ENCOUNTER — Other Ambulatory Visit: Payer: Self-pay | Admitting: *Deleted

## 2020-05-09 ENCOUNTER — Other Ambulatory Visit: Payer: Self-pay

## 2020-05-09 ENCOUNTER — Ambulatory Visit
Admission: RE | Admit: 2020-05-09 | Discharge: 2020-05-09 | Disposition: A | Discharge status: Home / Self Care | Payer: Medicare Other | Source: Ambulatory Visit | Attending: Radiation Oncology | Admitting: Radiation Oncology

## 2020-05-09 DIAGNOSIS — C3412 Malignant neoplasm of upper lobe, left bronchus or lung: Secondary | ICD-10-CM

## 2020-05-09 DIAGNOSIS — Z87891 Personal history of nicotine dependence: Secondary | ICD-10-CM | POA: Diagnosis not present

## 2020-05-09 DIAGNOSIS — J449 Chronic obstructive pulmonary disease, unspecified: Secondary | ICD-10-CM | POA: Diagnosis not present

## 2020-05-09 HISTORY — DX: Malignant neoplasm of upper lobe, left bronchus or lung: C34.12

## 2020-05-09 NOTE — Progress Notes (Signed)
Radiation Oncology         [phone] ________________________________  Multidisciplinary Thoracic Oncology Clinic [facility]) Initial Outpatient Consultation  Name: Renee Navarro MRN: [id number]  Date: 05/09/2020  DOB: [birth year]  CC:[name], [name], NP  Icard, Octavio Graves, DO   REFERRING PHYSICIAN: Garner Nash, DO  DIAGNOSIS: The encounter diagnosis was Primary cancer of left upper lobe of lung ([place]).    ICD-10-CM   1. Primary cancer of left upper lobe of lung (HCC)  C34.12      Squamous cell carcinoma of the left upper lobe, Clinical Stage IA1  HISTORY OF PRESENT ILLNESS::Renee Navarro is a 74 y.o. female who is seen as a courtesy of Dr. Julien Nordmann for an opinion concerning radiation therapy as part of management for her recently diagnosed lung cancer. The patient underwent a lung cancer screening chest CT scan on 01/08/2020 that showed a new 7.6 mm apical left upper lobe nodule. Follow up low-dose chest CT scan was recommended in three months.  Repeat CT scan of chest was performed on 04/10/2020 and showed continued enlargement of the previously noted left upper lobe nodule, demonstrating aggressive imaging characteristics.  PET scan on 04/24/2020 showed a hypermetabolic irregular apical left upper lobe nodule that was most consistent with stage IA primary bronchogenic carcinoma. Super chest CT scan was performed on that same day and showed an enlarging spiculated left upper lobe nodule.  The patient underwent a video bronchoscopy with electromagnetic navigation with fiducial marking procedure on 04/30/2020 under the care of Dr. Valeta Harms. Cytology from the procedure revealed malignant cells consistent with squamous cell carcinoma of the left upper lobe.  The patient was referred today for presentation in the multidisciplinary conference.  Radiology studies and pathology slides were presented there for review and discussion of treatment options.  A consensus was discussed regarding  potential next steps.  PREVIOUS RADIATION THERAPY: No  PAST MEDICAL HISTORY:  has a past medical history of COPD (chronic obstructive pulmonary disease) ([place]), Dyslipidemia, Dyspnea, Lung nodule seen on imaging study, Osteoporosis, Pneumonia, and Tobacco user.    PAST SURGICAL HISTORY: Past Surgical History:  Procedure Laterality Date   BRONCHIAL BIOPSY  04/30/2020   Procedure: BRONCHIAL BIOPSIES;  Surgeon: Garner Nash, DO;  Location: [place] [facility];  Service: Pulmonary;;   BRONCHIAL BRUSHINGS  04/30/2020   Procedure: BRONCHIAL BRUSHINGS;  Surgeon: Garner Nash, DO;  Location: [place] [facility];  Service: Pulmonary;;   BRONCHIAL NEEDLE ASPIRATION BIOPSY  04/30/2020   Procedure: BRONCHIAL NEEDLE ASPIRATION BIOPSIES;  Surgeon: Garner Nash, DO;  Location: [place] [facility];  Service: Pulmonary;;   BRONCHIAL WASHINGS  04/30/2020   Procedure: BRONCHIAL WASHINGS;  Surgeon: Garner Nash, DO;  Location: [place] [facility];  Service: Pulmonary;;   colonoscopy     FIDUCIAL MARKER PLACEMENT  04/30/2020   Procedure: FIDUCIAL MARKER PLACEMENT;  Surgeon: Garner Nash, DO;  Location: [place] [facility];  Service: Pulmonary;;   TONSILLECTOMY     TUBAL LIGATION     VIDEO BRONCHOSCOPY WITH ENDOBRONCHIAL NAVIGATION N/A 04/30/2020   Procedure: VIDEO BRONCHOSCOPY WITH ENDOBRONCHIAL NAVIGATION;  Surgeon: Garner Nash, DO;  Location: [place];  Service: Pulmonary;  Laterality: N/A;   WISDOM TOOTH EXTRACTION     WRIST SURGERY Left     FAMILY HISTORY: family history includes Cancer in her father; Diabetes in her sister; Hypertension in her sister; Lymphoma in her brother.  SOCIAL HISTORY:  reports that she quit smoking about 7 years ago. Her smoking use included cigarettes. She has a 38.25  pack-year smoking history. She has never used smokeless tobacco. She reports current alcohol use. She reports that she does not use drugs.  ALLERGIES: Budesonide-formoterol fumarate  MEDICATIONS:  Current  Outpatient Medications  Medication Sig Dispense Refill   albuterol (PROAIR HFA) 108 (90 Base) MCG/ACT inhaler Inhale 2 puffs into the lungs every 6 (six) hours as needed for shortness of breath. 6.7 g 3   docusate sodium (COLACE) 100 MG capsule Take 200-300 mg by mouth at bedtime.     Tiotropium Bromide Monohydrate (SPIRIVA RESPIMAT) 2.5 MCG/ACT AERS Inhale 2 puffs into the lungs daily. 4 g 0   Tiotropium Bromide Monohydrate (SPIRIVA RESPIMAT) 2.5 MCG/ACT AERS Inhale 2 puffs into the lungs daily. 4 g 1   No current facility-administered medications for this encounter.    REVIEW OF SYSTEMS: REVIEW OF SYSTEMS: A 10+ POINT REVIEW OF SYSTEMS WAS OBTAINED including neurology, dermatology, psychiatry, cardiac, respiratory, lymph, extremities, GI, GU, musculoskeletal, constitutional, reproductive, HEENT. All pertinent positives are noted in the HPI. All others are negative. She denies any pain within the chest area breathing difficulties cough or hemoptysis.  She stopped smoking several  years ago when up to that time she smoked approximately 1 pack of cigarettes per day. She denies any headaches or new bony pain    PHYSICAL EXAM:  weight is 111 lb 1.6 oz (50.4 kg). Her temperature is 96.1 F (35.6 C) (abnormal). Her blood pressure is 132/88 and her pulse is 81. Her respiration is 17 and oxygen saturation is 98%.   General: Alert and oriented, in no acute distress HEENT: Head is normocephalic. Extraocular movements are intact.  Neck: Neck is supple, no palpable cervical or supraclavicular lymphadenopathy. Heart: Regular in rate and rhythm with no murmurs, rubs, or gallops. Chest: Clear to auscultation bilaterally, with no rhonchi, wheezes, or rales. Abdomen: Soft, nontender, nondistended, with no rigidity or guarding. Extremities: No cyanosis or edema. Lymphatics: see Neck Exam Skin: No concerning lesions. Musculoskeletal: symmetric strength and muscle tone throughout. Neurologic: Cranial  nerves II through XII are grossly intact. No obvious focalities. Speech is fluent. Coordination is intact. Psychiatric: Judgment and insight are intact. Affect is appropriate.    KPS = 1  100 - Normal; no complaints; no evidence of disease. 90   - Able to carry on normal activity; minor signs or symptoms of disease. 80   - Normal activity with effort; some signs or symptoms of disease. 26   - Cares for self; unable to carry on normal activity or to do active work. 60   - Requires occasional assistance, but is able to care for most of his personal needs. 50   - Requires considerable assistance and frequent medical care. 33   - Disabled; requires special care and assistance. 50   - Severely disabled; hospital admission is indicated although death not imminent. 2   - Very sick; hospital admission necessary; active supportive treatment necessary. 10   - Moribund; fatal processes progressing rapidly. 0     - Dead  Karnofsky DA, Abelmann Donald, Craver LS and Burchenal [facility] [phone]) The use of the nitrogen mustards in the palliative treatment of carcinoma: with particular reference to bronchogenic carcinoma Cancer 1 [id number]  LABORATORY DATA:  Lab Results  Component Value Date   WBC 6.7 04/18/2020   HGB 15.1 (H) 04/18/2020   HCT 45.3 04/18/2020   MCV 90.0 04/18/2020   PLT 270.0 04/18/2020   Lab Results  Component Value Date   NA 139 04/30/2020   K 4.1  04/30/2020   CL 102 04/30/2020   CO2 27 04/30/2020   Lab Results  Component Value Date   ALT 20 04/30/2020   AST 20 04/30/2020   ALKPHOS 49 04/30/2020   BILITOT 0.8 04/30/2020    PULMONARY FUNCTION TEST:   Recent Review Flowsheet Data    Spirometry Latest Ref Rng & Units 04/18/2020   FVC-%PRED-PRE % 58   FVC-%PRED-POST % 59   FEV1-PRE L 1.12   FEV1-%PRED-PRE % 50   FEV1-POST L 1.12   FEV1-%PRED-POST % 50   DLCO [facility] ml/min/mmHg 13.35      RADIOGRAPHY: NM PET Image Initial (PI) Skull Base To Thigh  Result Date:  04/24/2020 CLINICAL DATA:  Initial treatment strategy for lung nodule. EXAM: NUCLEAR MEDICINE PET SKULL BASE TO THIGH TECHNIQUE: 5.5 mCi F-18 FDG was injected intravenously. Full-ring PET imaging was performed from the skull base to thigh after the radiotracer. CT data was obtained and used for attenuation correction and anatomic localization. Fasting blood glucose: 99 mg/dl COMPARISON:  CT chest 04/24/2020, 04/10/2020, 01/08/2020, 01/06/2019 and 11/20/2017. FINDINGS: Mediastinal blood pool activity: SUV max 2.5 Liver activity: SUV max NA NECK: No abnormal hypermetabolism. Incidental CT findings: None. CHEST: Irregular 8 x 9 mm nodule in the apical left upper lobe has an SUV max of 9.0. No additional hypermetabolic pulmonary nodules. Minimal hypermetabolism is seen in association with chronic subpleural consolidation/scarring in the posterior segment right upper lobe and superior segment right lower lobe. No hypermetabolic mediastinal, hilar or axillary lymph nodes. Incidental CT findings: Please see CT chest performed the same day. ABDOMEN/PELVIS: No abnormal hypermetabolism in the liver, adrenal glands, spleen or pancreas. No hypermetabolic lymph nodes. Incidental CT findings: Liver, gallbladder, adrenal glands, kidneys, spleen, pancreas, stomach and bowel are grossly unremarkable. Uterus is visualized. SKELETON: No abnormal hypermetabolism. Incidental CT findings: Degenerative changes in the spine. No worrisome lytic or sclerotic lesions. Enchondroma or infarct in the proximal left humerus. IMPRESSION: Hypermetabolic irregular apical left upper lobe nodule is most consistent with stage IA primary bronchogenic carcinoma. Electronically Signed   By: Lorin Picket M.D.   On: 04/24/2020 14:29   DG CHEST PORT 1 VIEW  Result Date: 04/30/2020 CLINICAL DATA:  Post bronch. EXAM: PORTABLE CHEST 1 VIEW COMPARISON:  CT 04/24/2020.  PET-CT 04/24/2020. FINDINGS: Mediastinum hilar structures normal. Borderline  cardiomegaly. Venous congestion. Postsurgical changes left upper lung. Mild left upper lung infiltrate noted, possibly related to biopsy. Infiltrate obscures previously identified left upper lobe nodular density. No pleural effusion or pneumothorax. No acute bony abnormality identified. Sclerotic changes proximal left humerus possibly related old infarct. Prominent skin fold on the left noted. IMPRESSION: 1. Mild left upper lung infiltrate noted, possibly related to biopsy. Infiltrate obscures previously identified left upper lobe nodular density. No pneumothorax post bronchoscopy. 2. Borderline cardiomegaly. No pulmonary venous congestion. Electronically Signed   By: Marcello Moores  Register   On: 04/30/2020 13:49   CT CHEST LCS NODULE F/U WO  CONTRAST  Result Date: 04/15/2020 CLINICAL DATA:  74 year old female former smoker (quit within the last 15 years) with 47 pack-year history of smoking. Lung cancer screening examination. EXAM: CT CHEST WITHOUT CONTRAST FOR LUNG CANCER SCREENING NODULE FOLLOW-UP TECHNIQUE: Multidetector CT imaging of the chest was performed following the standard protocol without IV contrast. COMPARISON:  Follow-up for prior abnormal low-dose lung cancer screening chest CT 01/08/2020. FINDINGS: Cardiovascular: Heart size is normal. There is no significant pericardial fluid, thickening or pericardial calcification. There is aortic atherosclerosis, as well as atherosclerosis of the great vessels  of the mediastinum and the coronary arteries, including calcified atherosclerotic plaque in the left main, left anterior descending and right coronary arteries. Mediastinum/Nodes: No pathologically enlarged mediastinal or hilar lymph nodes. Please note that accurate exclusion of hilar adenopathy is limited on noncontrast CT scans. Esophagus is unremarkable in appearance. No axillary lymphadenopathy. Lungs/Pleura: The nodule of concern on the prior examination in the left upper lobe near the apex (axial  image 50 of series 3) has enlarged slightly compared to the prior study, with a volume derived mean diameter of 9.2 mm on today's examination, with macrolobulated and slightly spiculated margins, concerning for a small neoplasm. Several other pulmonary nodules are noted elsewhere in the lungs bilaterally, generally similar in number and size compared to the prior study. No acute consolidative airspace disease. No pleural effusions. Diffuse bronchial wall thickening with moderate centrilobular and paraseptal emphysema. Extensive bilateral apical nodular pleuroparenchymal thickening and architectural distortion, most compatible with areas of chronic post infectious or inflammatory scarring, similar to prior studies. Upper Abdomen: Aortic atherosclerosis. Musculoskeletal: There are no aggressive appearing lytic or blastic lesions noted in the visualized portions of the skeleton. IMPRESSION: 1. Previously noted left upper lobe nodule of concern continues to enlarge and demonstrates aggressive imaging characteristics, categorized as Lung-RADS 4BS, suspicious. Additional imaging evaluation or consultation with Pulmonology or Thoracic Surgery recommended. 2. The "S" modifier above refers to potentially clinically significant non lung cancer related findings. Specifically, there is aortic atherosclerosis, in addition to left main and 2 vessel coronary artery disease. Assessment for potential risk factor modification, dietary therapy or pharmacologic therapy may be warranted, if clinically indicated. 3. Diffuse bronchial wall thickening with moderate centrilobular and paraseptal emphysema; imaging findings suggestive of underlying COPD. These results will be called to the ordering clinician or representative by the Radiologist Assistant, and communication documented in the PACS or [employer]. Aortic Atherosclerosis (ICD10-I70.0) and Emphysema (ICD10-J43.9). Electronically Signed   By: Vinnie Langton M.D.   On:  04/15/2020 12:41   CT Super D Chest Wo Contrast  Result Date: 04/24/2020 CLINICAL DATA:  Lung nodule. EXAM: CT CHEST WITHOUT CONTRAST TECHNIQUE: Multidetector CT imaging of the chest was performed using thin slice collimation for electromagnetic bronchoscopy planning purposes, without intravenous contrast. COMPARISON:  PET same day and CT chest 04/10/2020, 01/08/2020, 01/06/2019 and 11/20/2017. FINDINGS: Cardiovascular: Atherosclerotic calcification of the aorta and coronary arteries. Heart size normal. No pericardial effusion. Mediastinum/Nodes: No pathologically enlarged mediastinal or axillary lymph nodes. Hilar regions are difficult to definitively evaluate without IV contrast. Esophagus is grossly unremarkable. Lungs/Pleura: Biapical pleuroparenchymal scarring with inferior subpleural extension, right greater than left. Centrilobular emphysema. Spiculated nodule in the apical left upper lobe measures 8 x 9 mm (8/26) and has enlarged slightly from 01/08/2020, at which time it measures 6 x 7 mm. Nodule is new from 01/06/2019. No pleural fluid. Airway is unremarkable. Upper Abdomen: Visualized portions of the liver, adrenal glands, kidneys, spleen, pancreas, stomach and bowel are grossly unremarkable. Musculoskeletal: Degenerative changes in the spine. No worrisome lytic or sclerotic lesions. IMPRESSION: 1. Spiculated left upper lobe nodule has enlarged from 01/08/2020 and is new from 01/06/2019. Lesion was shown to be hypermetabolic on today's PET. Findings are most consistent with stage IA primary bronchogenic carcinoma. 2. Aortic atherosclerosis (ICD10-I70.0). Coronary artery calcification. 3.  Emphysema (ICD10-J43.9). Electronically Signed   By: Lorin Picket M.D.   On: 04/24/2020 14:34   DG C-ARM BRONCHOSCOPY  Result Date: 04/30/2020 C-ARM BRONCHOSCOPY: Fluoroscopy was utilized by the requesting physician.  No radiographic interpretation.  IMPRESSION: Squamous cell carcinoma of the left upper  lobe, Clinical Stage IA1  The patient would be an excellent candidate for stereotactic body radiation therapy directed at her solitary lesion in the left upper lobe. We discussed the general course of treatment side effects and potential long-term toxicities of this specialized radiation therapy in detail with the patient and her daughter. She does appear interested in this treatment approach, I have recommended she meet with cardiothoracic surgery prior to making any final decisions concerning management of her early stage lung cancer.  She will be meeting with Dr. Kipp Brood tomorrow for detailed discussion of surgical options.  PLAN: Treatment plan pending consult with cardiothoracic surgery tomorrow.     ------------------------------------------------  Blair Promise, PhD, MD  This document serves as a record of services personally performed by Gery Pray, MD. It was created on his behalf by Clerance Lav, a trained medical scribe. The creation of this record is based on the scribe's personal observations and the provider's statements to them. This document has been checked and approved by the attending provider.

## 2020-05-09 NOTE — Progress Notes (Signed)
The proposed treatment discussed in cancer conference 05/09/20 is for discussion purpose only and is not a binding recommendation.  The patient was not physically examined nor present for their treatment options.  Therefore, final treatment plans cannot be decided.

## 2020-05-10 ENCOUNTER — Encounter: Payer: Self-pay | Admitting: Thoracic Surgery (Cardiothoracic Vascular Surgery)

## 2020-05-10 ENCOUNTER — Institutional Professional Consult (permissible substitution) (INDEPENDENT_AMBULATORY_CARE_PROVIDER_SITE_OTHER): Payer: Medicare Other | Admitting: Thoracic Surgery (Cardiothoracic Vascular Surgery)

## 2020-05-10 VITALS — BP 121/79 | HR 82 | Resp 18 | Ht 64.0 in | Wt 110.2 lb

## 2020-05-10 DIAGNOSIS — C3412 Malignant neoplasm of upper lobe, left bronchus or lung: Secondary | ICD-10-CM | POA: Diagnosis not present

## 2020-05-10 NOTE — Progress Notes (Signed)
[address]       [place] [address]             [phone]                    Renee Navarro  Medical Record #[id number] Date of Birth: [birth year]  Referring: Garner Nash, DO Primary Care: Debbrah Alar, NP Primary Cardiologist: Shirlee More, MD  Chief Complaint:    Chief Complaint  Patient presents with   Consult    poss RATs r/t squamous cell carcinoma    History of Present Illness:    Renee Navarro 74 y.o. female presents for surgical evaluation of a biopsy-proven squamous cell cancer of the left upper lobe.  She has a long smoking history but has been cigarette free for over 7 years.  The nodules originally identified in lung cancer screening program.  In regards to her symptoms she does admit to some exertional dyspnea at short distance of ambulation, but denies any chest pain or weight changes.  She also denies any neurologic symptoms.  She is very hesitant to undergo any surgical resection due to concern for worsening in her respiratory status.  Of note she has not been taking any of her medications prescribed for her COPD.    Smoking Hx: Quit smoking 7 years ago.   Zubrod Score: At the time of surgery this patients most appropriate activity status/level should be described as: _0     0    Normal activity, no symptoms _1     1    Restricted in physical strenuous activity but ambulatory, able to do out light work _2     2    Ambulatory and capable of self care, unable to do work activities, up and about               >50 % of waking hours                              _3     3    Only limited self care, in bed greater than 50% of waking hours _4     4    Completely disabled, no self care, confined to bed or chair _5     5    Moribund   Past Medical History:  Diagnosis Date   COPD (chronic obstructive pulmonary disease) ([place])    Dyslipidemia    diet controlled   Dyspnea    with exertion   Lung nodule seen on imaging study     Osteoporosis    Pneumonia    x 1   Tobacco user    quit 06/2012    Past Surgical History:  Procedure Laterality Date   BRONCHIAL BIOPSY  04/30/2020   Procedure: BRONCHIAL BIOPSIES;  Surgeon: Garner Nash, DO;  Location: [facility];  Service: Pulmonary;;   BRONCHIAL BRUSHINGS  04/30/2020   Procedure: BRONCHIAL BRUSHINGS;  Surgeon: Garner Nash, DO;  Location: [facility];  Service: Pulmonary;;   BRONCHIAL NEEDLE ASPIRATION BIOPSY  04/30/2020   Procedure: BRONCHIAL NEEDLE ASPIRATION BIOPSIES;  Surgeon: Garner Nash, DO;  Location: [facility];  Service: Pulmonary;;   BRONCHIAL WASHINGS  04/30/2020   Procedure: BRONCHIAL WASHINGS;  Surgeon: Garner Nash, DO;  Location: [facility];  Service: Pulmonary;;   colonoscopy     FIDUCIAL MARKER PLACEMENT  04/30/2020   Procedure: FIDUCIAL MARKER PLACEMENT;  Surgeon: Valeta Harms,  Octavio Graves, DO;  Location: [facility];  Service: Pulmonary;;   TONSILLECTOMY     TUBAL LIGATION     VIDEO BRONCHOSCOPY WITH ENDOBRONCHIAL NAVIGATION N/A 04/30/2020   Procedure: VIDEO BRONCHOSCOPY WITH ENDOBRONCHIAL NAVIGATION;  Surgeon: Garner Nash, DO;  Location: [facility];  Service: Pulmonary;  Laterality: N/A;   WISDOM TOOTH EXTRACTION     WRIST SURGERY Left     Family History  Problem Relation Age of Onset   Cancer Father        colon   Diabetes Sister    Hypertension Sister    Lymphoma Brother      Social History   Tobacco Use  Smoking Status Former Smoker   Packs/day: 0.75   Years: 51.00   Pack years: 38.25   Types: Cigarettes   Quit date: 07/14/2012   Years since quitting: 7.8  Smokeless Tobacco Never Used    Social History   Substance and Sexual Activity  Alcohol Use Yes   Comment: occ glass of wine     Allergies  Allergen Reactions   Budesonide-Formoterol Fumarate Cough    Current Outpatient Medications  Medication Sig Dispense Refill   albuterol (PROAIR HFA) 108 (90 Base) MCG/ACT  inhaler Inhale 2 puffs into the lungs every 6 (six) hours as needed for shortness of breath. 6.7 g 3   docusate sodium (COLACE) 100 MG capsule Take 200-300 mg by mouth at bedtime.     No current facility-administered medications for this visit.    Review of Systems  Constitutional: Negative.   Respiratory: Positive for shortness of breath.   Cardiovascular: Negative.   Neurological: Negative.      PHYSICAL EXAMINATION: BP 121/79 (BP Location: Right Arm, Patient Position: Sitting)    Pulse 82    Resp 18    Ht _0  (1.626 m)    Wt 110 lb 3.2 oz (50 kg)    LMP  (LMP Unknown)    SpO2 93% Comment: RA with mask on   BMI 18.92 kg/m  Physical Exam Constitutional:      General: She is not in acute distress.    Appearance: Normal appearance. She is not ill-appearing or toxic-appearing.  HENT:     Head: Normocephalic and atraumatic.  Eyes:     Extraocular Movements: Extraocular movements intact.     Conjunctiva/sclera: Conjunctivae normal.  Cardiovascular:     Rate and Rhythm: Normal rate.  Pulmonary:     Effort: Pulmonary effort is normal. No respiratory distress.  Abdominal:     General: Abdomen is flat.  Musculoskeletal:     Cervical back: Normal range of motion.  Skin:    General: Skin is warm and dry.  Neurological:     General: No focal deficit present.     Mental Status: She is alert and oriented to person, place, and time.     Diagnostic Studies & Laboratory data:     Recent Radiology Findings:   NM PET Image Initial (PI) Skull Base To Thigh  Result Date: 04/24/2020 CLINICAL DATA:  Initial treatment strategy for lung nodule. EXAM: NUCLEAR MEDICINE PET SKULL BASE TO THIGH TECHNIQUE: 5.5 mCi F-18 FDG was injected intravenously. Full-ring PET imaging was performed from the skull base to thigh after the radiotracer. CT data was obtained and used for attenuation correction and anatomic localization. Fasting blood glucose: 99 mg/dl COMPARISON:  CT chest 04/24/2020, 04/10/2020,  01/08/2020, 01/06/2019 and 11/20/2017. FINDINGS: Mediastinal blood pool activity: SUV max 2.5 Liver activity: SUV max NA NECK: No  abnormal hypermetabolism. Incidental CT findings: None. CHEST: Irregular 8 x 9 mm nodule in the apical left upper lobe has an SUV max of 9.0. No additional hypermetabolic pulmonary nodules. Minimal hypermetabolism is seen in association with chronic subpleural consolidation/scarring in the posterior segment right upper lobe and superior segment right lower lobe. No hypermetabolic mediastinal, hilar or axillary lymph nodes. Incidental CT findings: Please see CT chest performed the same day. ABDOMEN/PELVIS: No abnormal hypermetabolism in the liver, adrenal glands, spleen or pancreas. No hypermetabolic lymph nodes. Incidental CT findings: Liver, gallbladder, adrenal glands, kidneys, spleen, pancreas, stomach and bowel are grossly unremarkable. Uterus is visualized. SKELETON: No abnormal hypermetabolism. Incidental CT findings: Degenerative changes in the spine. No worrisome lytic or sclerotic lesions. Enchondroma or infarct in the proximal left humerus. IMPRESSION: Hypermetabolic irregular apical left upper lobe nodule is most consistent with stage IA primary bronchogenic carcinoma. Electronically Signed   By: Lorin Picket M.D.   On: 04/24/2020 14:29   DG CHEST PORT 1 VIEW  Result Date: 04/30/2020 CLINICAL DATA:  Post bronch. EXAM: PORTABLE CHEST 1 VIEW COMPARISON:  CT 04/24/2020.  PET-CT 04/24/2020. FINDINGS: Mediastinum hilar structures normal. Borderline cardiomegaly. Venous congestion. Postsurgical changes left upper lung. Mild left upper lung infiltrate noted, possibly related to biopsy. Infiltrate obscures previously identified left upper lobe nodular density. No pleural effusion or pneumothorax. No acute bony abnormality identified. Sclerotic changes proximal left humerus possibly related old infarct. Prominent skin fold on the left noted. IMPRESSION: 1. Mild left upper lung  infiltrate noted, possibly related to biopsy. Infiltrate obscures previously identified left upper lobe nodular density. No pneumothorax post bronchoscopy. 2. Borderline cardiomegaly. No pulmonary venous congestion. Electronically Signed   By: Marcello Moores  Register   On: 04/30/2020 13:49   CT Super D Chest Wo Contrast  Result Date: 04/24/2020 CLINICAL DATA:  Lung nodule. EXAM: CT CHEST WITHOUT CONTRAST TECHNIQUE: Multidetector CT imaging of the chest was performed using thin slice collimation for electromagnetic bronchoscopy planning purposes, without intravenous contrast. COMPARISON:  PET same day and CT chest 04/10/2020, 01/08/2020, 01/06/2019 and 11/20/2017. FINDINGS: Cardiovascular: Atherosclerotic calcification of the aorta and coronary arteries. Heart size normal. No pericardial effusion. Mediastinum/Nodes: No pathologically enlarged mediastinal or axillary lymph nodes. Hilar regions are difficult to definitively evaluate without IV contrast. Esophagus is grossly unremarkable. Lungs/Pleura: Biapical pleuroparenchymal scarring with inferior subpleural extension, right greater than left. Centrilobular emphysema. Spiculated nodule in the apical left upper lobe measures 8 x 9 mm (8/26) and has enlarged slightly from 01/08/2020, at which time it measures 6 x 7 mm. Nodule is new from 01/06/2019. No pleural fluid. Airway is unremarkable. Upper Abdomen: Visualized portions of the liver, adrenal glands, kidneys, spleen, pancreas, stomach and bowel are grossly unremarkable. Musculoskeletal: Degenerative changes in the spine. No worrisome lytic or sclerotic lesions. IMPRESSION: 1. Spiculated left upper lobe nodule has enlarged from 01/08/2020 and is new from 01/06/2019. Lesion was shown to be hypermetabolic on today's PET. Findings are most consistent with stage IA primary bronchogenic carcinoma. 2. Aortic atherosclerosis (ICD10-I70.0). Coronary artery calcification. 3.  Emphysema (ICD10-J43.9). Electronically Signed    By: Lorin Picket M.D.   On: 04/24/2020 14:34   DG C-ARM BRONCHOSCOPY  Result Date: 04/30/2020 C-ARM BRONCHOSCOPY: Fluoroscopy was utilized by the requesting physician.  No radiographic interpretation.       I have independently reviewed the above radiology studies  and reviewed the findings with the patient.   Recent Lab Findings: Lab Results  Component Value Date   WBC 6.7 04/18/2020   HGB  15.1 (H) 04/18/2020   HCT 45.3 04/18/2020   PLT 270.0 04/18/2020   GLUCOSE 114 (H) 04/30/2020   CHOL 243 (H) 12/15/2017   TRIG 103.0 12/15/2017   HDL 71.10 12/15/2017   LDLDIRECT 198.1 11/19/2008   LDLCALC 151 (H) 12/15/2017   ALT 20 04/30/2020   AST 20 04/30/2020   NA 139 04/30/2020   K 4.1 04/30/2020   CL 102 04/30/2020   CREATININE 0.68 04/30/2020   BUN 7 (L) 04/30/2020   CO2 27 04/30/2020   TSH 1.25 11/19/2008   INR 1.0 04/30/2020     PFTs: - FVC: 58% - FEV1: 50% -DLCO: 68%    Assessment / Plan:   74 year old female with 1 cm squamous cell cancer of the left upper lobe.  We had a long discussion about the risks and benefits of robotic assisted left upper lobectomy and I explained to her that the main concern is her respiratory status.  Given that she is currently symptomatic and short distances she likely will have ongoing dyspnea following surgery.  I also explained to her that from a pulmonary standpoint she is not quite optimized specially since she is not compliant with her medications.  On review of her pulmonary function testing she would tolerate surgical resection, but she is still quite fearful of any operation.  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  She has already met with the multidisciplinary team and they have offered her to SBRT.  Given the size of the nodule I think that this is a decent option.  Furthermore if at a later point she  decides to undergo surgical resection then she still could potentially be a candidate for this.  She will call us with her ultimate decision.  I  spent 40 minutes with  the patient face to face and greater then 50% of the time was spent in counseling and coordination of care.    Lajuana Matte 05/10/2020 3:36 PM

## 2020-05-11 DIAGNOSIS — H40013 Open angle with borderline findings, low risk, bilateral: Secondary | ICD-10-CM | POA: Diagnosis not present

## 2020-05-13 ENCOUNTER — Telehealth: Payer: Self-pay | Admitting: *Deleted

## 2020-05-13 ENCOUNTER — Telehealth: Payer: Self-pay | Admitting: Pulmonary Disease

## 2020-05-13 NOTE — Telephone Encounter (Signed)
I received a vm message from Ms. Eldredge. I called her back but was unable to reach. I did leave vm message with my name and phone number to call.

## 2020-05-13 NOTE — Telephone Encounter (Signed)
Spoke with the pt  She states that she wanted to let Dr Valeta Harms know that she has decided to go through with radiation tx for her lung CA FYI

## 2020-05-14 NOTE — Telephone Encounter (Signed)
Thanks for letting me know Garner Nash, DO [facility] 05/14/2020 8:20 AM

## 2020-05-16 ENCOUNTER — Telehealth: Payer: Self-pay | Admitting: *Deleted

## 2020-05-16 NOTE — Telephone Encounter (Signed)
Patient called and decided to have radiation treatment. Sent in basket to Dr. Sondra Come and his nurse Lattie Haw.

## 2020-05-20 ENCOUNTER — Telehealth: Payer: Self-pay | Admitting: Pulmonary Disease

## 2020-05-20 NOTE — Telephone Encounter (Signed)
Pt calling back to state that Dr. Sondra Come office has called her to get an appointment scheduled, Nothing further needed.

## 2020-05-21 ENCOUNTER — Ambulatory Visit
Admission: RE | Admit: 2020-05-21 | Discharge: 2020-05-21 | Disposition: A | Discharge status: Home / Self Care | Payer: Medicare Other | Source: Ambulatory Visit | Attending: Radiation Oncology | Admitting: Radiation Oncology

## 2020-05-21 ENCOUNTER — Other Ambulatory Visit: Payer: Self-pay

## 2020-05-21 DIAGNOSIS — Z87891 Personal history of nicotine dependence: Secondary | ICD-10-CM | POA: Diagnosis not present

## 2020-05-21 DIAGNOSIS — C3412 Malignant neoplasm of upper lobe, left bronchus or lung: Secondary | ICD-10-CM | POA: Diagnosis not present

## 2020-05-23 ENCOUNTER — Telehealth: Payer: Self-pay | Admitting: Pulmonary Disease

## 2020-05-23 NOTE — Telephone Encounter (Signed)
Spoke with pt. She has a pending OV on 05/29/20 with Dr. Valeta Harms. She is requesting to change this to a televisit as she will not have transportation to the visit.  Dr. Valeta Harms - please advise. Thanks.

## 2020-05-23 NOTE — Telephone Encounter (Signed)
Yes ok for telelvisit  Garner Nash, DO [facility] 05/23/2020 6:16 PM

## 2020-05-24 DIAGNOSIS — C3412 Malignant neoplasm of upper lobe, left bronchus or lung: Secondary | ICD-10-CM | POA: Diagnosis not present

## 2020-05-24 DIAGNOSIS — Z87891 Personal history of nicotine dependence: Secondary | ICD-10-CM | POA: Diagnosis not present

## 2020-05-24 NOTE — Telephone Encounter (Signed)
Lm for patient.  

## 2020-05-28 ENCOUNTER — Ambulatory Visit
Admission: RE | Admit: 2020-05-28 | Discharge: 2020-05-28 | Disposition: A | Discharge status: Home / Self Care | Payer: Medicare Other | Source: Ambulatory Visit | Attending: Radiation Oncology | Admitting: Radiation Oncology

## 2020-05-28 ENCOUNTER — Other Ambulatory Visit: Payer: Self-pay

## 2020-05-28 DIAGNOSIS — C3412 Malignant neoplasm of upper lobe, left bronchus or lung: Secondary | ICD-10-CM | POA: Diagnosis not present

## 2020-05-29 ENCOUNTER — Encounter: Payer: Self-pay | Admitting: Pulmonary Disease

## 2020-05-29 ENCOUNTER — Ambulatory Visit (INDEPENDENT_AMBULATORY_CARE_PROVIDER_SITE_OTHER): Payer: Medicare Other | Admitting: Pulmonary Disease

## 2020-05-29 ENCOUNTER — Telehealth: Payer: Self-pay | Admitting: Pulmonary Disease

## 2020-05-29 ENCOUNTER — Ambulatory Visit: Payer: Medicare Other | Admitting: Radiation Oncology

## 2020-05-29 ENCOUNTER — Other Ambulatory Visit: Payer: Self-pay

## 2020-05-29 DIAGNOSIS — Z23 Encounter for immunization: Secondary | ICD-10-CM | POA: Diagnosis not present

## 2020-05-29 DIAGNOSIS — J449 Chronic obstructive pulmonary disease, unspecified: Secondary | ICD-10-CM

## 2020-05-29 DIAGNOSIS — R0602 Shortness of breath: Secondary | ICD-10-CM

## 2020-05-29 DIAGNOSIS — C3412 Malignant neoplasm of upper lobe, left bronchus or lung: Secondary | ICD-10-CM

## 2020-05-29 DIAGNOSIS — R0609 Other forms of dyspnea: Secondary | ICD-10-CM

## 2020-05-29 DIAGNOSIS — J432 Centrilobular emphysema: Secondary | ICD-10-CM

## 2020-05-29 DIAGNOSIS — R0600 Dyspnea, unspecified: Secondary | ICD-10-CM | POA: Diagnosis not present

## 2020-05-29 MED ORDER — SPIRIVA RESPIMAT 1.25 MCG/ACT IN AERS: 2.0000 | INHALATION_SPRAY | Freq: Every day | RESPIRATORY_TRACT | 3 refills | Status: DC

## 2020-05-29 NOTE — Telephone Encounter (Signed)
Called and spoke with patient, she stated that her Spiriva is $135/month.  She spoke with Medicare and was told that Tiotropium Bromide Monohydrate was the generic for Spiriva.  I called and spoke with Claiborne Billings at the [facility], confirmed that Spiriva does not have a generic, she is unsure whether the patient is in the donut hole or if she just has a high copay for her medications.  I called the patient and advised her of what the pharmacy told me.  She verbalized understanding.  She will call Medicare in the am and get the list of preferred inhalers and then call us back with that list so we can provide that list to Dr. Valeta Harms.  Will await a return from the patient.

## 2020-05-29 NOTE — Progress Notes (Addendum)
Virtual Visit via Telephone Note  I connected with [name] on 05/29/20 at  9:15 AM EDT by telephone and verified that I am speaking with the correct person using two identifiers.  Location: Patient: Renee Navarro (home) Provider: Garner Nash, DO (office)   I discussed the limitations, risks, security and privacy concerns of performing an evaluation and management service by telephone and the availability of in person appointments. I also discussed with the patient that there may be a patient responsible charge related to this service. The patient expressed understanding and agreed to proceed.  History of Present Illness:  74 yo FM, PMH COPD, former smoker, quit 7 years ago, enrolled in LDCT screening. New LUL nodule that was no present in comparison to previous images. Recent CT chest with LUL nodule 1.2 cm in largest cross section. She was referred to see me in consultation for videobronchoscopy, navigation and fiducial placement. She has severe COPD Stage 3 based on FEV1 50% predicted. She does not want to have lobectomy. I discussed with her at length about the options of surgery vs radiation therapy. She has opted to pursue radiation therapy if that is an option but needing to proceed with bx first. From a copd standpoint she occasionally uses albuterol and that's it. She is not on any maintence inhalers.   OV 05/29/2020: This is a 74 year old female that was recently taken for navigational bronchoscopy for left upper lobe 8 mm lung nodule.  Found to be squamous cell carcinoma.  She was subsequently seen in multidisciplinary thoracic conference.  She also met with Dr. Kipp Brood from thoracic surgery.  Due to patient's concern for surgery and lung function decision was made for referral to radiation oncology for SBRT.  She saw Dr. Sondra Come to yesterday.  Dr. Sofie Hartigan has already started SBRT treatments.  Her first treatment was yesterday.  She tolerated this well.  She does state that she feels  short of breath.  She was given Spiriva Respimat at her last office visit.  She has not used any of these maintenance inhalers yet.  She has been using her albuterol at least 2-3 times per day.   Observations/Objective: Patient able to speak in complete sentences.  No audible wheezing.  PFT Results Latest Ref Rng & Units 04/18/2020  FVC-Pre L 1.74  FVC-Predicted Pre % 58  FVC-Post L 1.77  FVC-Predicted Post % 59  Pre FEV1/FVC % % 64  Post FEV1/FCV % % 64  FEV1-Pre L 1.12  FEV1-Predicted Pre % 50  FEV1-Post L 1.12  DLCO uncorrected ml/min/mmHg 13.35  DLCO UNC% % 68  DLCO corrected ml/min/mmHg 13.35  DLCO COR %Predicted % 68  DLVA Predicted % 95     Assessment and Plan:  Stage II moderate COPD Centrilobular emphysema Dyspnea on exertion shortness of breath secondary above Left upper lobe squamous cell carcinoma status post SBRT, currently under treatments. Both Covid vaccines received plus posterior.  Plan: Continue follow-up with SBRT treatments per radiation oncology. Recommended using her Spiriva Respimat that she was given samples of. Ensure that she has after prescription at pharmacy.  New prescription ordered. Recommend patient get her flu shot. Continue to use albuterol as needed for shortness of breath or wheezing.  Patient had several questions regarding side effects of inhalers.  These were answered today via phone.  Additionally she had questions about returning to regular activities.  This was recommended for her to continue regular activities of daily living.  And increasing her exercise tolerance.  Future she may benefit from pulmonary rehabilitation.  Return to clinic in 3 months for COPD follow-up.  Follow Up Instructions:  I discussed the assessment and treatment plan with the patient. The patient was provided an opportunity to ask questions and all were answered. The patient agreed with the plan and demonstrated an understanding of the instructions.   The  patient was advised to call back or seek an in-person evaluation if the symptoms worsen or if the condition fails to improve as anticipated.  I provided 21 minutes of non-face-to-face time during this encounter.   Garner Nash, DO

## 2020-05-29 NOTE — Patient Instructions (Signed)
Thank you for visiting Dr. Valeta Harms at [facility]. Today we recommend the following: No orders of the defined types were placed in this encounter.  Meds ordered this encounter  Medications  . Tiotropium Bromide Monohydrate (SPIRIVA RESPIMAT) 1.25 MCG/ACT AERS    Sig: Inhale 2 puffs into the lungs daily.    Dispense:  3 each    Refill:  3   Ok to get flu shot  Return in about 3 months (around 08/29/2020) for Dr. Valeta Harms .    Please do your part to reduce the spread of COVID-19.

## 2020-05-30 ENCOUNTER — Ambulatory Visit
Admission: RE | Admit: 2020-05-30 | Discharge: 2020-05-30 | Disposition: A | Discharge status: Home / Self Care | Payer: Medicare Other | Source: Ambulatory Visit | Attending: Radiation Oncology | Admitting: Radiation Oncology

## 2020-05-30 DIAGNOSIS — C3412 Malignant neoplasm of upper lobe, left bronchus or lung: Secondary | ICD-10-CM

## 2020-05-30 NOTE — Telephone Encounter (Signed)
Spoke with the pt  She was able to get her 90 day supply of Spiriva  Nothing further needed per pt

## 2020-05-30 NOTE — Telephone Encounter (Signed)
Patient states Spiriva is $135 for 3 month supply. Patient is going to pick up Spiriva at the pharmacy. Patient phone number is [phone].

## 2020-05-31 DIAGNOSIS — Z23 Encounter for immunization: Secondary | ICD-10-CM | POA: Diagnosis not present

## 2020-05-31 NOTE — Telephone Encounter (Signed)
Will close encounter, as patient had visit on 05/29/2020.

## 2020-06-04 ENCOUNTER — Ambulatory Visit
Admission: RE | Admit: 2020-06-04 | Discharge: 2020-06-04 | Disposition: A | Discharge status: Home / Self Care | Payer: Medicare Other | Source: Ambulatory Visit | Attending: Radiation Oncology | Admitting: Radiation Oncology

## 2020-06-04 ENCOUNTER — Encounter: Payer: Self-pay | Admitting: Radiation Oncology

## 2020-06-04 DIAGNOSIS — C3412 Malignant neoplasm of upper lobe, left bronchus or lung: Secondary | ICD-10-CM

## 2020-06-17 ENCOUNTER — Telehealth: Payer: Self-pay

## 2020-06-17 NOTE — Telephone Encounter (Signed)
Patient called stating she has had pain behind her left shoulder 3-4 times since her radiation tx ended. It lasts an hour and is gone. Patient advised after talking with Dr. Sondra Come that it could be from the position  her arm was in for treatment  but not likely. Patient only had a few treatments. Patient advised to see if the pain persists and will see her again on 07/04/2020 for f/u. Patient verbalized understanding.

## 2020-06-25 DIAGNOSIS — L293 Anogenital pruritus, unspecified: Secondary | ICD-10-CM | POA: Diagnosis not present

## 2020-07-03 NOTE — Progress Notes (Addendum)
[name] presents today for follow-up after completing SBRT to her left lung on 06/04/2020  Fatigue: Patient denies Pain: Patient denies SOB: Denies any changes. She continues to use her inhalers as needed Cough: Denies a dry or congestive cough Appetite: Patient denies any nausea and reports a healthy appetite Wt Readings from Last 3 Encounters:  07/04/20 111 lb 2 oz (50.4 kg)  05/10/20 110 lb 3.2 oz (50 kg)  05/09/20 111 lb 1.6 oz (50.4 kg)   Other issues of note: Reports an occasional pain/discomfort behind her left shoulder. She reports it seems to have resolved within the last week or so. 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

## 2020-07-04 ENCOUNTER — Other Ambulatory Visit: Payer: Self-pay

## 2020-07-04 ENCOUNTER — Ambulatory Visit
Admission: RE | Admit: 2020-07-04 | Discharge: 2020-07-04 | Disposition: A | Discharge status: Home / Self Care | Payer: Medicare Other | Source: Ambulatory Visit | Attending: Radiation Oncology | Admitting: Radiation Oncology

## 2020-07-04 VITALS — BP 149/77 | HR 69 | Temp 97.0°F | Resp 20 | Ht 64.0 in | Wt 111.1 lb

## 2020-07-04 DIAGNOSIS — C3412 Malignant neoplasm of upper lobe, left bronchus or lung: Secondary | ICD-10-CM | POA: Diagnosis not present

## 2020-07-04 DIAGNOSIS — Z79899 Other long term (current) drug therapy: Secondary | ICD-10-CM | POA: Insufficient documentation

## 2020-07-04 DIAGNOSIS — Z923 Personal history of irradiation: Secondary | ICD-10-CM | POA: Insufficient documentation

## 2020-07-04 NOTE — Progress Notes (Signed)
  Radiation Oncology         [phone] ________________________________  Name: Renee Navarro MRN: [id number]  Date: 07/04/2020  DOB: [birth year]  Follow-Up Visit Note  CC: Debbrah Alar, NP  Garner Nash, DO    ICD-10-CM   1. Primary cancer of left upper lobe of lung (HCC)  C34.12 CT Chest Wo Contrast    Diagnosis: Squamous cell carcinoma of the left upper lobe, Clinical Stage IA1  Interval Since Last Radiation: One month  Radiation Treatment Dates: 05/28/2020 through 06/04/2020 Site Technique Total Dose (Gy) Dose per Fx (Gy) Completed Fx Beam Energies  Lung, Left: Lung_Lt IMRT 54/54 18 3/3 6XFFF    Narrative:  The patient returns today for routine follow-up. No significant interval since the end of treatment.  On review of systems, she reports a healthy appetite. She also reports having had an occasional pain/discomfort behind her left shoulder that spontaneously resolved. She has been using her inhaler as needed. She denies chest pain, cough, and changes in shortness of breath.  She denies any fatigue with her radiation therapy          ALLERGIES:  is allergic to budesonide-formoterol fumarate.  Meds: Current Outpatient Medications  Medication Sig Dispense Refill  . albuterol (PROAIR HFA) 108 (90 Base) MCG/ACT inhaler Inhale 2 puffs into the lungs every 6 (six) hours as needed for shortness of breath. 6.7 g 3  . Tiotropium Bromide Monohydrate (SPIRIVA RESPIMAT) 1.25 MCG/ACT AERS Inhale 2 puffs into the lungs daily. 3 each 3  . docusate sodium (COLACE) 100 MG capsule Take 200-300 mg by mouth at bedtime. (Patient not taking: Reported on 07/04/2020)     No current facility-administered medications for this encounter.    Physical Findings: The patient is in no acute distress. Patient is alert and oriented.  height is 5\' 4"  (1.626 m) and weight is 111 lb 2 oz (50.4 kg). Her temporal temperature is 97 F (36.1 C) (abnormal). Her blood pressure is 149/77 (abnormal) and  her pulse is 69. Her respiration is 20 and oxygen saturation is 100%. No significant changes. Lungs are clear to auscultation bilaterally. Heart has regular rate and rhythm. No palpable cervical, supraclavicular, or axillary adenopathy. Abdomen soft, non-tender, normal bowel sounds.   Lab Findings: Lab Results  Component Value Date   WBC 6.7 04/18/2020   HGB 15.1 (H) 04/18/2020   HCT 45.3 04/18/2020   MCV 90.0 04/18/2020   PLT 270.0 04/18/2020    Radiographic Findings: No results found.  Impression: Squamous cell carcinoma of the left upper lobe, Clinical Stage IA1  The patient tolerated her SBRT well without any side effects.  Plan: The patient will follow up with radiation oncology in 3 months.  Prior to this visit the patient will undergo a chest CT scan to assess her response to stereotactic body radiation therapy    ____________________________________   Blair Promise, PhD, MD  This document serves as a record of services personally performed by Gery Pray, MD. It was created on his behalf by Clerance Lav, a trained medical scribe. The creation of this record is based on the scribe's personal observations and the provider's statements to them. This document has been checked and approved by the attending provider.

## 2020-07-04 NOTE — Progress Notes (Incomplete)
  Patient Name: Renee Navarro MRN: [id number] DOB: [birth year] Referring Physician: June Leap Date of Service: 06/04/2020 [facility], [place]                                                        End Of Treatment Note  Diagnoses: C34.12-Malignant neoplasm of upper lobe, left bronchus or lung  Cancer Staging: Squamous cell carcinoma of the left upper lobe, Clinical Stage IA1  Intent: Curative  Radiation Treatment Dates: 05/28/2020 through 06/04/2020 Site Technique Total Dose (Gy) Dose per Fx (Gy) Completed Fx Beam Energies  Lung, Left: Lung_Lt IMRT 54/54 18 3/3 6XFFF   Narrative: The patient tolerated radiation therapy quite well. She did notice a slight increase in her shortness of breath but nothing significant. She was given an additional inhaler by pulmonary medicine.  Plan: The patient will follow-up with radiation oncology in one month.  ________________________________________________   Blair Promise, PhD, MD  This document serves as a record of services personally performed by Gery Pray, MD. It was created on his behalf by Clerance Lav, a trained medical scribe. The creation of this record is based on the scribe's personal observations and the provider's statements to them. This document has been checked and approved by the attending provider.

## 2020-07-11 DIAGNOSIS — Z86010 Personal history of colonic polyps: Secondary | ICD-10-CM | POA: Diagnosis not present

## 2020-07-11 DIAGNOSIS — K5900 Constipation, unspecified: Secondary | ICD-10-CM | POA: Diagnosis not present

## 2020-07-11 DIAGNOSIS — K625 Hemorrhage of anus and rectum: Secondary | ICD-10-CM | POA: Diagnosis not present

## 2020-07-11 DIAGNOSIS — Z800 Family history of malignant neoplasm of digestive organs: Secondary | ICD-10-CM | POA: Diagnosis not present

## 2020-08-01 ENCOUNTER — Telehealth: Payer: Self-pay | Admitting: *Deleted

## 2020-08-01 NOTE — Telephone Encounter (Signed)
Called patient to update, spoke with patient.

## 2020-09-04 ENCOUNTER — Telehealth: Payer: Self-pay | Admitting: *Deleted

## 2020-09-04 NOTE — Telephone Encounter (Signed)
RETURNED PATIENT'S PHONE CALL, SPOKE WITH PATIENT. ?

## 2020-09-06 ENCOUNTER — Telehealth: Payer: Self-pay | Admitting: *Deleted

## 2020-09-06 NOTE — Telephone Encounter (Signed)
CALLED PATIENT TO INFORM OF STAT LABS ON 10-02-20 @ 10 AM AND HER CT TO FOLLOW ON 10-02-20 - ARRIVAL TIME- 10:45 AM @ [facility], NO RESTRICTIONS TO TEST, PATIENT TO FOLLOW- UP  WITH DR. KINARD FOR RESULTS ON 10-03-20 @ 9 AM, SPOKE WITH PATIENT AND SHE IS AWARE OF THESE APPTS.

## 2020-09-19 DIAGNOSIS — Z720 Tobacco use: Secondary | ICD-10-CM | POA: Insufficient documentation

## 2020-09-19 DIAGNOSIS — R0600 Dyspnea, unspecified: Secondary | ICD-10-CM | POA: Insufficient documentation

## 2020-09-19 DIAGNOSIS — J449 Chronic obstructive pulmonary disease, unspecified: Secondary | ICD-10-CM | POA: Insufficient documentation

## 2020-09-19 DIAGNOSIS — Z87891 Personal history of nicotine dependence: Secondary | ICD-10-CM | POA: Insufficient documentation

## 2020-09-19 DIAGNOSIS — J189 Pneumonia, unspecified organism: Secondary | ICD-10-CM | POA: Insufficient documentation

## 2020-09-19 DIAGNOSIS — E785 Hyperlipidemia, unspecified: Secondary | ICD-10-CM | POA: Insufficient documentation

## 2020-09-19 DIAGNOSIS — R911 Solitary pulmonary nodule: Secondary | ICD-10-CM | POA: Insufficient documentation

## 2020-09-25 NOTE — Progress Notes (Unsigned)
Cardiology Office Note:    Date:  09/26/2020   ID:  Renee Navarro, DOB 04/14/1946, MRN [id number]  PCP:  Debbrah Alar, NP  Cardiologist:  Shirlee More, MD    Referring MD: Debbrah Alar, NP    ASSESSMENT:    1. Left anterior hemiblock   2. Tobacco user   3. Dyslipidemia   4. Coronary artery calcification   5. Aortic atherosclerosis (HCC)   6. Pure hypercholesterolemia    PLAN:    In order of problems listed above:  1. Stable, her EKG pattern today's EKG looks much more like chronic lung disease with right ventricular predominance. 2. Intolerant of statins we will place her on Zetia and if her result is not favorable PCSK9 Hibner is appropriate after surviving lung cancer much of the prognosis depends on heart disease. 3. Asymptomatic resume lipid-lowering therapy 4. Screening duplex abdominal aorta regarding abdominal aortic aneurysm age cigarette smoking criteria   Next appointment: 6 months she will follow-up with the [facility] line office which is closer.   Medication Adjustments/Labs and Tests Ordered: Current medicines are reviewed at length with the patient today.  Concerns regarding medicines are outlined above.  Orders Placed This Encounter  Procedures  . Comprehensive Metabolic Panel (CMET)  . Lipid panel  . EKG 12-Lead  . VAS [place] AAA DUPLEX   Meds ordered this encounter  Medications  . ezetimibe (ZETIA) 10 MG tablet    Sig: Take 1 tablet (10 mg total) by mouth daily.    Dispense:  90 tablet    Refill:  3    No chief complaint on file.   History of Present Illness:    Renee Navarro is a 75 y.o. female with a hx of coronary artery calcification and aortic atherosclerosis incidentally found on CT scan, bifascicular heart block obstructive chronic bronchitis Gold stage a COPD and hyperlipidemia.  She was last seen 10/02/2019 and decided to defer an ischemia evaluation at that time.  Compliance with diet, lifestyle and medications: Yes  She has  had radiation therapy for lung cancer.  She is done well not short of breath edema chest pain palpitation or syncope.  She is optimistic that she is a cure.  She needs to have a She did not tolerate statins and despite my recommendation was never put on Zetia. She is a high risk with a previous LDL greater than 150 and I asked her to accept nonstatin therapy we will start Zetia 10 mg daily check a lipid profile today and she asked about having a duplex of her abdominal aorta because of being a cigarette smoker I agree it was never done entering Medicare and I will order a screening duplex today.  She is having no anginal discomfort. Past Medical History:  Diagnosis Date  . Abnormal EKG 09/20/2018  . Chronic bronchitis ([place]) 10/03/2014  . COPD (chronic obstructive pulmonary disease) ([place])   . Dyslipidemia    diet controlled  . Dyspnea    with exertion  . Lung nodule 02/29/2012   3/24/2014CT Chest: no change in RUL lung nodule, likely benign. No change since 2008  No further CTs needed   . Lung nodule seen on imaging study   . Obstructive chronic bronchitis without exacerbation gold stage A COPD 11/17/2007  . Osteoporosis   . Pneumonia    x 1  . Primary cancer of left upper lobe of lung ([place]) 05/09/2020  . RBBB (right bundle branch block with left anterior fascicular block) 09/22/2018  .  Tobacco user    quit 06/2012    Past Surgical History:  Procedure Laterality Date  . BRONCHIAL BIOPSY  04/30/2020   Procedure: BRONCHIAL BIOPSIES;  Surgeon: Garner Nash, DO;  Location: [facility];  Service: Pulmonary;;  . BRONCHIAL BRUSHINGS  04/30/2020   Procedure: BRONCHIAL BRUSHINGS;  Surgeon: Garner Nash, DO;  Location: [facility];  Service: Pulmonary;;  . BRONCHIAL NEEDLE ASPIRATION BIOPSY  04/30/2020   Procedure: BRONCHIAL NEEDLE ASPIRATION BIOPSIES;  Surgeon: Garner Nash, DO;  Location: [facility];  Service: Pulmonary;;  . BRONCHIAL WASHINGS  04/30/2020   Procedure: BRONCHIAL  WASHINGS;  Surgeon: Garner Nash, DO;  Location: [facility];  Service: Pulmonary;;  . colonoscopy    . FIDUCIAL MARKER PLACEMENT  04/30/2020   Procedure: FIDUCIAL MARKER PLACEMENT;  Surgeon: Garner Nash, DO;  Location: [facility];  Service: Pulmonary;;  . TONSILLECTOMY    . TUBAL LIGATION    . VIDEO BRONCHOSCOPY WITH ENDOBRONCHIAL NAVIGATION N/A 04/30/2020   Procedure: VIDEO BRONCHOSCOPY WITH ENDOBRONCHIAL NAVIGATION;  Surgeon: Garner Nash, DO;  Location: [facility];  Service: Pulmonary;  Laterality: N/A;  . WISDOM TOOTH EXTRACTION    . WRIST SURGERY Left     Current Medications: Current Meds  Medication Sig  . albuterol (PROAIR HFA) 108 (90 Base) MCG/ACT inhaler Inhale 2 puffs into the lungs every 6 (six) hours as needed for shortness of breath.  . ezetimibe (ZETIA) 10 MG tablet Take 1 tablet (10 mg total) by mouth daily.  . Tiotropium Bromide Monohydrate (SPIRIVA RESPIMAT) 1.25 MCG/ACT AERS Inhale 2 puffs into the lungs daily.     Allergies:   Budesonide-formoterol fumarate and Statins   Social History   Socioeconomic History  . Marital status: Married    Spouse name: Not on file  . Number of children: Not on file  . Years of education: Not on file  . Highest education level: Not on file  Occupational History  . Occupation: Product manager: [employer]    Comment: [employer]  Tobacco Use  . Smoking status: Former Smoker    Packs/day: 0.75    Years: 51.00    Pack years: 38.25    Types: Cigarettes    Quit date: 07/14/2012    Years since quitting: 8.2  . Smokeless tobacco: Never Used  Vaping Use  . Vaping Use: Never used  Substance and Sexual Activity  . Alcohol use: Yes    Comment: occ glass of wine  . Drug use: No  . Sexual activity: Not Currently    Birth control/protection: Post-menopausal  Other Topics Concern  . Not on file  Social History Narrative   Caffeine Use: occasional   Regular exercise:  No         Social Determinants  of Health   Financial Resource Strain: Not on file  Food Insecurity: Not on file  Transportation Needs: Not on file  Physical Activity: Not on file  Stress: Not on file  Social Connections: Not on file     Family History: The patient's family history includes Cancer in her father; Diabetes in her sister; Hypertension in her sister; Lymphoma in her brother. ROS:   Please see the history of present illness.    All other systems reviewed and are negative.  EKGs/Labs/Other Studies Reviewed:    The following studies were reviewed today:  EKG:  EKG ordered today and personally reviewed.  The ekg ordered today demonstrates COPD pattern with left axis deviation  right ventricular predominance and right atrial enlargement  Recent Labs: 04/18/2020: Hemoglobin 15.1; Platelets 270.0 04/30/2020: ALT 20; BUN 7; Creatinine, Ser 0.68; Potassium 4.1; Sodium 139  Recent Lipid Panel    Component Value Date/Time   CHOL 243 (H) 12/15/2017 0905   TRIG 103.0 12/15/2017 0905   HDL 71.10 12/15/2017 0905   CHOLHDL 3 12/15/2017 0905   VLDL 20.6 12/15/2017 0905   LDLCALC 151 (H) 12/15/2017 0905   LDLDIRECT 198.1 11/19/2008 1026    Physical Exam:    VS:  BP 102/60   Pulse 78   Ht 5\' 4"  (1.626 m)   Wt 110 lb 1.3 oz (49.9 kg)   LMP  (LMP Unknown)   SpO2 97%   BMI 18.90 kg/m     Wt Readings from Last 3 Encounters:  09/26/20 110 lb 1.3 oz (49.9 kg)  07/04/20 111 lb 2 oz (50.4 kg)  05/10/20 110 lb 3.2 oz (50 kg)     GEN: Small stature well nourished, well developed in no acute distress HEENT: Normal NECK: No JVD; No carotid bruits LYMPHATICS: No lymphadenopathy CARDIAC: RRR, no murmurs, rubs, gallops RESPIRATORY:  Clear to auscultation without rales, wheezing or rhonchi  ABDOMEN: Soft, non-tender, non-distended MUSCULOSKELETAL:  No edema; No deformity  SKIN: Warm and dry NEUROLOGIC:  Alert and oriented x 3 PSYCHIATRIC:  Normal affect    Signed, Shirlee More, MD  09/26/2020 10:29 AM     [facility]

## 2020-09-26 ENCOUNTER — Other Ambulatory Visit: Payer: Self-pay

## 2020-09-26 ENCOUNTER — Encounter: Payer: Self-pay | Admitting: Cardiology

## 2020-09-26 ENCOUNTER — Ambulatory Visit (INDEPENDENT_AMBULATORY_CARE_PROVIDER_SITE_OTHER): Payer: Medicare Other | Admitting: Cardiology

## 2020-09-26 VITALS — BP 102/60 | HR 78 | Ht 64.0 in | Wt 110.1 lb

## 2020-09-26 DIAGNOSIS — E785 Hyperlipidemia, unspecified: Secondary | ICD-10-CM

## 2020-09-26 DIAGNOSIS — I444 Left anterior fascicular block: Secondary | ICD-10-CM

## 2020-09-26 DIAGNOSIS — E7800 Pure hypercholesterolemia, unspecified: Secondary | ICD-10-CM | POA: Diagnosis not present

## 2020-09-26 DIAGNOSIS — I2584 Coronary atherosclerosis due to calcified coronary lesion: Secondary | ICD-10-CM | POA: Diagnosis not present

## 2020-09-26 DIAGNOSIS — I452 Bifascicular block: Secondary | ICD-10-CM | POA: Diagnosis not present

## 2020-09-26 DIAGNOSIS — I2510 Atherosclerotic heart disease of native coronary artery without angina pectoris: Secondary | ICD-10-CM | POA: Diagnosis not present

## 2020-09-26 DIAGNOSIS — I700 Atherosclerosis of aorta: Secondary | ICD-10-CM

## 2020-09-26 DIAGNOSIS — Z720 Tobacco use: Secondary | ICD-10-CM | POA: Diagnosis not present

## 2020-09-26 MED ORDER — EZETIMIBE 10 MG PO TABS: 10.0000 mg | ORAL_TABLET | Freq: Every day | ORAL | 3 refills | Status: DC

## 2020-09-26 NOTE — Patient Instructions (Signed)
Medication Instructions:  Your physician has recommended you make the following change in your medication:   Start Zetia 10 mg daily  *If you need a refill on your cardiac medications before your next appointment, please call your pharmacy*   Lab Work: Your physician recommends that you have a CMET and lipids today in the office.  If you have labs (blood work) drawn today and your tests are completely normal, you will receive your results only by: Marland Kitchen MyChart Message (if you have MyChart) OR . A paper copy in the mail If you have any lab test that is abnormal or we need to change your treatment, we will call you to review the results.   Testing/Procedures: Your physician has requested that you have an abdominal aorta duplex. During this test, an ultrasound is used to evaluate the aorta. Allow 30 minutes for this exam. Do not eat after midnight the day before and avoid carbonated beverages   Follow-Up: At [facility], you and your health needs are our priority.  As part of our continuing mission to provide you with exceptional heart care, we have created designated Provider Care Teams.  These Care Teams include your primary Cardiologist (physician) and Advanced Practice Providers (APPs -  Physician Assistants and Nurse Practitioners) who all work together to provide you with the care you need, when you need it.  We recommend signing up for the patient portal called "MyChart".  Sign up information is provided on this After Visit Summary.  MyChart is used to connect with patients for Virtual Visits (Telemedicine).  Patients are able to view lab/test results, encounter notes, upcoming appointments, etc.  Non-urgent messages can be sent to your provider as well.   To learn more about what you can do with MyChart, go to [email or web address].    Your next appointment:   1 year(s)  The format for your next appointment:   In Person  Provider:   Shirlee More, MD   Other  Instructions Ezetimibe Tablets What is this medicine? EZETIMIBE (ez ET i mibe) treats high cholesterol. Ezetimibe blocks the absorption of cholesterol from the stomach. It is used with lifestyle changes, like diet and exercise. It may be used alone or with other medicines. This medicine may be used for other purposes; ask your health care provider or pharmacist if you have questions. COMMON BRAND NAME(S): Zetia What should I tell my health care provider before I take this medicine? They need to know if you have any of these conditions:  kidney disease  liver disease  muscle cramps, pain  muscle injury  thyroid disease  an unusual or allergic reaction to ezetimibe, other medicines, foods, dyes, or preservatives  pregnant or trying to get pregnant  breast-feeding How should I use this medicine? Take this medicine by mouth. Take it as directed on the prescription label at the same time every day. You can take it with or without food. If it upsets your stomach, take it with food. Keep taking it unless your health care provider tells you to stop. Take bile acid sequestrants at a different time of day than this medicine. Take this medicine 2 hours BEFORE or 4 hours AFTER bile acid sequestrants. Talk to your health care provider about the use of this medicine in children. While it may be prescribed for children as young as 10 for selected conditions, precautions do apply. Overdosage: If you think you have taken too much of this medicine contact a poison control center or emergency room at  once. NOTE: This medicine is only for you. Do not share this medicine with others. What if I miss a dose? If you miss a dose, take it as soon as you can. If it is almost time for your next dose, take only that dose. Do not take double or extra doses. What may interact with this medicine? Do not take this medicine with any of the following medications:  fenofibrate  gemfibrozil This medicine may also  interact with the following medications:  antacids  cyclosporine  herbal medicines like red yeast rice  other medicines to lower cholesterol or triglycerides This list may not describe all possible interactions. Give your health care provider a list of all the medicines, herbs, non-prescription drugs, or dietary supplements you use. Also tell them if you smoke, drink alcohol, or use illegal drugs. Some items may interact with your medicine. What should I watch for while using this medicine? Visit your health care provider for regular checks on your progress. Tell your health care provider if your symptoms do not start to get better or if they get worse. Your health care provider may tell you to stop taking this medicine if you develop muscle problems. If your muscle problems do not go away after stopping this medicine, contact your health care provider. Do not become pregnant while taking this medicine. Women should inform their health care provider if they wish to become pregnant or think they might be pregnant. There is potential for serious harm to an unborn child. Talk to your health care provider for more information. Do not breast-feed an infant while taking this medicine. Taking this medicine is only part of a total heart healthy program. Your health care provider may give you a special diet to follow. Avoid alcohol. Avoid smoking. Ask your health care provider how much you should exercise. What side effects may I notice from receiving this medicine? Side effects that you should report to your doctor or health care provider as soon as possible:  allergic reactions (skin rash, itching or hives; swelling of the face, lips, or tongue)  liver injury (dark yellow or brown urine; general ill feeling or flu-like symptoms; loss of appetite, right upper belly pain; unusually weak or tired, yellowing of the eyes or skin)  muscle injury (dark urine; trouble passing urine or change in the amount of  urine; unusually weak or tired; muscle pain; back pain) Side effects that usually do not require medical attention (report to your doctor or health care provider if they continue or are bothersome):  depressed mood  diarrhea  headache  infection (fever, chills, cough, sore throat, pain, or trouble passing urine)  joint pain  stomach pain This list may not describe all possible side effects. Call your doctor for medical advice about side effects. You may report side effects to FDA at 1-800-FDA-1088. Where should I keep my medicine? Keep out of the reach of children and pets. Store at room temperature between 15 and 30 degrees C (59 and 86 degrees F). Protect from moisture. Get rid of any unused medicine after the expiration date. NOTE: This sheet is a summary. It may not cover all possible information. If you have questions about this medicine, talk to your doctor, pharmacist, or health care provider.  2021 Elsevier/Gold Standard (2019-06-21 17:28:51)

## 2020-09-27 ENCOUNTER — Other Ambulatory Visit: Payer: Self-pay | Admitting: Pulmonary Disease

## 2020-09-27 LAB — COMPREHENSIVE METABOLIC PANEL
ALT: 23 IU/L (ref 0–32)
AST: 24 IU/L (ref 0–40)
Albumin/Globulin Ratio: 2 (ref 1.2–2.2)
Albumin: 4.8 g/dL — ABNORMAL HIGH (ref 3.7–4.7)
Alkaline Phosphatase: 80 IU/L (ref 44–121)
BUN/Creatinine Ratio: 13 (ref 12–28)
BUN: 10 mg/dL (ref 8–27)
Bilirubin Total: 0.4 mg/dL (ref 0.0–1.2)
CO2: 22 mmol/L (ref 20–29)
Calcium: 9.3 mg/dL (ref 8.7–10.3)
Chloride: 102 mmol/L (ref 96–106)
Creatinine, Ser: 0.76 mg/dL (ref 0.57–1.00)
Globulin, Total: 2.4 g/dL (ref 1.5–4.5)
Glucose: 93 mg/dL (ref 65–99)
Potassium: 4.7 mmol/L (ref 3.5–5.2)
Sodium: 141 mmol/L (ref 134–144)
Total Protein: 7.2 g/dL (ref 6.0–8.5)
eGFR: 83 mL/min/{1.73_m2} (ref >59)

## 2020-09-27 LAB — LIPID PANEL
Chol/HDL Ratio: 3.6 ratio (ref 0.0–4.4)
Cholesterol, Total: 262 mg/dL — ABNORMAL HIGH (ref 100–199)
HDL: 73 mg/dL (ref >39)
LDL Chol Calc (NIH): 165 mg/dL — ABNORMAL HIGH (ref 0–99)
Triglycerides: 134 mg/dL (ref 0–149)
VLDL Cholesterol Cal: 24 mg/dL (ref 5–40)

## 2020-09-27 MED ORDER — SPIRIVA RESPIMAT 1.25 MCG/ACT IN AERS
2.0000 | INHALATION_SPRAY | Freq: Every day | RESPIRATORY_TRACT | 3 refills | Status: DC
Start: 2020-09-27 — End: 2021-07-30

## 2020-10-01 ENCOUNTER — Telehealth: Payer: Self-pay | Admitting: Cardiology

## 2020-10-01 NOTE — Telephone Encounter (Signed)
Left message on patients voicemail to please return our call.   

## 2020-10-01 NOTE — Telephone Encounter (Signed)
Follow Up:      Pt would like her Cholesterol results from last Thursday please.

## 2020-10-02 ENCOUNTER — Ambulatory Visit
Admission: RE | Admit: 2020-10-02 | Discharge: 2020-10-02 | Disposition: A | Discharge status: Home / Self Care | Payer: Medicare Other | Source: Ambulatory Visit | Attending: Radiation Oncology | Admitting: Radiation Oncology

## 2020-10-02 ENCOUNTER — Ambulatory Visit (HOSPITAL_COMMUNITY)
Admission: RE | Admit: 2020-10-02 | Discharge: 2020-10-02 | Disposition: A | Discharge status: Home / Self Care | Payer: Medicare Other | Source: Ambulatory Visit | Attending: Radiation Oncology | Admitting: Radiation Oncology

## 2020-10-02 ENCOUNTER — Other Ambulatory Visit: Payer: Self-pay | Admitting: *Deleted

## 2020-10-02 ENCOUNTER — Encounter (HOSPITAL_COMMUNITY): Payer: Self-pay

## 2020-10-02 ENCOUNTER — Other Ambulatory Visit: Payer: Self-pay

## 2020-10-02 DIAGNOSIS — J984 Other disorders of lung: Secondary | ICD-10-CM | POA: Diagnosis not present

## 2020-10-02 DIAGNOSIS — C3412 Malignant neoplasm of upper lobe, left bronchus or lung: Secondary | ICD-10-CM | POA: Diagnosis not present

## 2020-10-02 DIAGNOSIS — J432 Centrilobular emphysema: Secondary | ICD-10-CM | POA: Diagnosis not present

## 2020-10-02 DIAGNOSIS — I2510 Atherosclerotic heart disease of native coronary artery without angina pectoris: Secondary | ICD-10-CM | POA: Diagnosis not present

## 2020-10-02 DIAGNOSIS — C3492 Malignant neoplasm of unspecified part of left bronchus or lung: Secondary | ICD-10-CM | POA: Diagnosis not present

## 2020-10-02 LAB — BUN & CREATININE (CHCC)
BUN: 10 mg/dL (ref 8–23)
Creatinine: 0.75 mg/dL (ref 0.44–1.00)
GFR, Estimated: >60 mL/min (ref >60)

## 2020-10-02 NOTE — Telephone Encounter (Signed)
Spoke with patient regarding results and recommendation.  Patient verbalizes understanding and is agreeable to plan of care. Advised patient to call back with any issues or concerns.  

## 2020-10-02 NOTE — Telephone Encounter (Signed)
Left message on patients voicemail to please return our call.   

## 2020-10-03 ENCOUNTER — Encounter: Payer: Self-pay | Admitting: Radiation Oncology

## 2020-10-03 ENCOUNTER — Other Ambulatory Visit: Payer: Self-pay

## 2020-10-03 ENCOUNTER — Ambulatory Visit
Admission: RE | Admit: 2020-10-03 | Discharge: 2020-10-03 | Disposition: A | Discharge status: Home / Self Care | Payer: Medicare Other | Source: Ambulatory Visit | Attending: Radiation Oncology | Admitting: Radiation Oncology

## 2020-10-03 VITALS — BP 148/78 | HR 79 | Temp 97.0°F | Resp 18 | Ht 64.0 in | Wt 111.4 lb

## 2020-10-03 DIAGNOSIS — J432 Centrilobular emphysema: Secondary | ICD-10-CM | POA: Insufficient documentation

## 2020-10-03 DIAGNOSIS — Z923 Personal history of irradiation: Secondary | ICD-10-CM | POA: Diagnosis not present

## 2020-10-03 DIAGNOSIS — Z79899 Other long term (current) drug therapy: Secondary | ICD-10-CM | POA: Insufficient documentation

## 2020-10-03 DIAGNOSIS — R911 Solitary pulmonary nodule: Secondary | ICD-10-CM

## 2020-10-03 DIAGNOSIS — C3412 Malignant neoplasm of upper lobe, left bronchus or lung: Secondary | ICD-10-CM | POA: Diagnosis not present

## 2020-10-03 DIAGNOSIS — Z08 Encounter for follow-up examination after completed treatment for malignant neoplasm: Secondary | ICD-10-CM | POA: Diagnosis not present

## 2020-10-03 NOTE — Progress Notes (Signed)
Patient is here today for follow up to left lung completed radiation November 2021.  Patient denies having any pain.  Patient reports getting short of breath with walking on inclines.  Denies coughing/hemopytis.  Denies any swallowing issues.  Reports good appetite.  Denies having any fatigue during radiation and presently.    Vitals:   10/03/20 0900  BP: (!) 148/78  Pulse: 79  Resp: 18  Temp: (!) 97 F (36.1 C)  TempSrc: Temporal  SpO2: 98%  Weight: 111 lb 6 oz (50.5 kg)  Height: 5\' 4"  (1.626 m)

## 2020-10-03 NOTE — Progress Notes (Signed)
Radiation Oncology         [phone] ________________________________  Name: Renee Navarro MRN: [id number]  Date: 10/03/2020  DOB: 12/14/1945  Follow-Up Visit Note  CC: Debbrah Alar, NP  Garner Nash, DO    ICD-10-CM   1. Primary cancer of left upper lobe of lung ([place])  C34.12 CT CHEST WO CONTRAST  2. Lung nodule  R91.1     Diagnosis: Squamous cell carcinoma of the left upper lobe, Clinical Stage IA1  Interval Since Last Radiation: Four moths and one day  Radiation Treatment Dates: 05/28/2020 through 06/04/2020 Site Technique Total Dose (Gy) Dose per Fx (Gy) Completed Fx Beam Energies  Lung, Left: Lung_Lt IMRT 54/54 18 3/3 6XFFF    Narrative:  The patient returns today for routine follow-up. Since her last visit, she underwent a chest CT scan on 10/02/2020 that showed an interval decrease in the size of the left upper lobe pulmonary nodule with adjacent fiducial markers. There were no new or progressive findings.  On review of systems, she reports overall feeling well without any new medical issues. She denies any significant cough or hemoptysis.  She denies any pain in the left upper chest region.  She denies any breathing issues.  ALLERGIES:  is allergic to budesonide-formoterol fumarate and statins.  Meds: Current Outpatient Medications  Medication Sig Dispense Refill  . albuterol (PROAIR HFA) 108 (90 Base) MCG/ACT inhaler Inhale 2 puffs into the lungs every 6 (six) hours as needed for shortness of breath. 6.7 g 3  . ezetimibe (ZETIA) 10 MG tablet Take 1 tablet (10 mg total) by mouth daily. 90 tablet 3  . Tiotropium Bromide Monohydrate (SPIRIVA RESPIMAT) 1.25 MCG/ACT AERS Inhale 2 puffs into the lungs daily. 3 each 3   No current facility-administered medications for this encounter.    Physical Findings: The patient is in no acute distress. Patient is alert and oriented.  height is 5\' 4"  (1.626 m) and weight is 111 lb 6 oz (50.5 kg). Her temporal temperature  is 97 F (36.1 C) (abnormal). Her blood pressure is 148/78 (abnormal) and her pulse is 79. Her respiration is 18 and oxygen saturation is 98%. No significant changes. Lungs are clear to auscultation bilaterally. Heart has regular rate and rhythm. No palpable cervical, supraclavicular, or axillary adenopathy. Abdomen soft, non-tender, normal bowel sounds.   Lab Findings: Lab Results  Component Value Date   WBC 6.7 04/18/2020   HGB 15.1 (H) 04/18/2020   HCT 45.3 04/18/2020   MCV 90.0 04/18/2020   PLT 270.0 04/18/2020    Radiographic Findings: CT Chest Wo Contrast  Result Date: 10/03/2020 CLINICAL DATA:  Non-small-cell lung cancer.  Restaging. EXAM: CT CHEST WITHOUT CONTRAST TECHNIQUE: Multidetector CT imaging of the chest was performed following the standard protocol without IV contrast. COMPARISON:  04/24/2020. FINDINGS: Cardiovascular: The heart size is normal. No substantial pericardial effusion. Coronary artery calcification is evident. Atherosclerotic calcification is noted in the wall of the thoracic aorta. Mediastinum/Nodes: No mediastinal lymphadenopathy. No evidence for gross hilar lymphadenopathy although assessment is limited by the lack of intravenous contrast on today's study. The esophagus has normal imaging features. There is no axillary lymphadenopathy. Lungs/Pleura: Centrilobular and paraseptal emphysema evident. Biapical pleuroparenchymal scarring evident left upper lobe pulmonary nodule measured previously at 8 x 9 mm now measures 7 x 4 mm. Adjacent fiducial markers evident. Parenchymal scarring in the superior segment right lower lobe is stable. No new suspicious pulmonary nodule or mass on today's exam. No focal airspace consolidation. No  pleural effusion. Upper Abdomen: Unremarkable. Musculoskeletal: No worrisome lytic or sclerotic osseous abnormality. IMPRESSION: 1. Interval decrease in size of the left upper lobe pulmonary nodule with adjacent fiducial markers. No new or  progressive findings on today's exam. 2. Aortic Atherosclerosis (ICD10-I70.0) and Emphysema (ICD10-J43.9). Electronically Signed   By: Misty Stanley M.D.   On: 10/03/2020 07:44    Impression: Squamous cell carcinoma of the left upper lobe, Clinical Stage IA1  No evidence of recurrence on clinical exam today. Most recent chest CT scan showed an interval decrease in the size of the left upper lobe pulmonary nodule with adjacent fiducial markers. There were no new or progressive findings.  I reviewed the patient's most recent chest CT scan findings in detail with she and her daughter.  All questions answered.  She will meet with Dr. Valeta Harms in the near future and will discuss with him when she may return to work.  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  Plan: The patient will follow up with radiation oncology in 6 months. She will undergo a repeat chest CT scan prior to that visit.  Total time spent in this encounter was 15 minutes which included reviewing the patient's most recent chest CT scan, physical examination, ordering of future chest CT scan, and documentation.  ____________________________________   Blair Promise, PhD, MD  This document serves as a record of services personally performed by Gery Pray, MD. It was created on his behalf by Clerance Lav, a trained medical scribe. The creation of this record is based on the scribe's personal observations and the provider's statements to them. This document has been checked and approved by the attending provider.

## 2020-10-23 ENCOUNTER — Ambulatory Visit (INDEPENDENT_AMBULATORY_CARE_PROVIDER_SITE_OTHER): Payer: Medicare Other | Admitting: Pulmonary Disease

## 2020-10-23 ENCOUNTER — Other Ambulatory Visit: Payer: Self-pay

## 2020-10-23 ENCOUNTER — Encounter: Payer: Self-pay | Admitting: Pulmonary Disease

## 2020-10-23 VITALS — BP 110/76 | HR 86 | Temp 97.7°F | Ht 64.0 in | Wt 110.5 lb

## 2020-10-23 DIAGNOSIS — R0600 Dyspnea, unspecified: Secondary | ICD-10-CM | POA: Diagnosis not present

## 2020-10-23 DIAGNOSIS — J432 Centrilobular emphysema: Secondary | ICD-10-CM | POA: Diagnosis not present

## 2020-10-23 DIAGNOSIS — Z87891 Personal history of nicotine dependence: Secondary | ICD-10-CM

## 2020-10-23 DIAGNOSIS — J449 Chronic obstructive pulmonary disease, unspecified: Secondary | ICD-10-CM | POA: Diagnosis not present

## 2020-10-23 DIAGNOSIS — Z7185 Encounter for immunization safety counseling: Secondary | ICD-10-CM

## 2020-10-23 DIAGNOSIS — C3412 Malignant neoplasm of upper lobe, left bronchus or lung: Secondary | ICD-10-CM

## 2020-10-23 DIAGNOSIS — R0609 Other forms of dyspnea: Secondary | ICD-10-CM

## 2020-10-23 NOTE — Patient Instructions (Addendum)
Thank you for visiting Dr. Valeta Harms at [facility]. Today we recommend the following:  Call if refills are needed  Return in about 1 year (around 10/23/2021), or if symptoms worsen or fail to improve.    Please do your part to reduce the spread of COVID-19.

## 2020-10-23 NOTE — Progress Notes (Signed)
Synopsis: Referred in Oct 2021 for lung nodule by Debbrah Alar, NP  Subjective:   PATIENT ID: Renee Navarro GENDER: female DOB: 1945/11/14, MRN: [id number]  Chief Complaint  Patient presents with  . Follow-up    Here to follow up on her COPD.  Pt stated that she has been doing well.     75 yo FM, PMH COPD, former smoker, quit 7 years ago, enrolled in LDCT screening. New LUL nodule that was no present in comparison to previous images. Recent CT chest with LUL nodule 1.2 cm in largest cross section. She was referred to see me in consultation for videobronchoscopy, navigation and fiducial placement. She has severe COPD Stage 3 based on FEV1 50% predicted. She does not want to have lobectomy. I discussed with her at length about the options of surgery vs radiation therapy. She has opted to pursue radiation therapy if that is an option but needing to proceed with bx first. From a copd standpoint she occasionally uses albuterol and that's it. She is not on any maintence inhalers.   OV 10/23/2020: Here today for follow-up.  Last seen in office via televisit.  Since patient was last seen she underwent video bronchoscopy with tissue sampling of left upper lobe nodule confirming non-small cell lung cancer.  Patient underwent 3 SBRT treatments to the left lung by Dr. Sondra Come radiation oncology. Doing well. DOE is still present. She is using her inhalers regularly, Spiriva plus as needed albuterol.  She does not need refills today.  She has them sent to mail in pharmacy.   Past Medical History:  Diagnosis Date  . Abnormal EKG 09/20/2018  . Chronic bronchitis ([place]) 10/03/2014  . COPD (chronic obstructive pulmonary disease) ([place])   . Dyslipidemia    diet controlled  . Dyspnea    with exertion  . History of radiation therapy 05/21/2020-06/04/2020   3 SBRT treatments to left lung; Dr Gery Pray  . Lung nodule 02/29/2012   3/24/2014CT Chest: no change in RUL lung nodule, likely benign. No change  since 2008  No further CTs needed   . Lung nodule seen on imaging study   . Obstructive chronic bronchitis without exacerbation gold stage A COPD 11/17/2007  . Osteoporosis   . Pneumonia    x 1  . Primary cancer of left upper lobe of lung ([place]) 05/09/2020  . RBBB (right bundle branch block with left anterior fascicular block) 09/22/2018  . Tobacco user    quit 06/2012     Family History  Problem Relation Age of Onset  . Cancer Father        colon  . Diabetes Sister   . Hypertension Sister   . Lymphoma Brother      Past Surgical History:  Procedure Laterality Date  . BRONCHIAL BIOPSY  04/30/2020   Procedure: BRONCHIAL BIOPSIES;  Surgeon: Garner Nash, DO;  Location: [facility];  Service: Pulmonary;;  . BRONCHIAL BRUSHINGS  04/30/2020   Procedure: BRONCHIAL BRUSHINGS;  Surgeon: Garner Nash, DO;  Location: [facility];  Service: Pulmonary;;  . BRONCHIAL NEEDLE ASPIRATION BIOPSY  04/30/2020   Procedure: BRONCHIAL NEEDLE ASPIRATION BIOPSIES;  Surgeon: Garner Nash, DO;  Location: [facility];  Service: Pulmonary;;  . BRONCHIAL WASHINGS  04/30/2020   Procedure: BRONCHIAL WASHINGS;  Surgeon: Garner Nash, DO;  Location: [facility];  Service: Pulmonary;;  . colonoscopy    . FIDUCIAL MARKER PLACEMENT  04/30/2020   Procedure: FIDUCIAL MARKER PLACEMENT;  Surgeon: Garner Nash, DO;  Location: [facility];  Service: Pulmonary;;  . TONSILLECTOMY    . TUBAL LIGATION    . VIDEO BRONCHOSCOPY WITH ENDOBRONCHIAL NAVIGATION N/A 04/30/2020   Procedure: VIDEO BRONCHOSCOPY WITH ENDOBRONCHIAL NAVIGATION;  Surgeon: Garner Nash, DO;  Location: [facility];  Service: Pulmonary;  Laterality: N/A;  . WISDOM TOOTH EXTRACTION    . WRIST SURGERY Left     Social History   Socioeconomic History  . Marital status: Married    Spouse name: Not on file  . Number of children: Not on file  . Years of education: Not on file  . Highest education level: Not on file  Occupational  History  . Occupation: Product manager: [employer]    Comment: [employer]  Tobacco Use  . Smoking status: Former Smoker    Packs/day: 0.75    Years: 51.00    Pack years: 38.25    Types: Cigarettes    Quit date: 07/14/2012    Years since quitting: 8.2  . Smokeless tobacco: Never Used  Vaping Use  . Vaping Use: Never used  Substance and Sexual Activity  . Alcohol use: Yes    Comment: occ glass of wine  . Drug use: No  . Sexual activity: Not Currently    Birth control/protection: Post-menopausal  Other Topics Concern  . Not on file  Social History Narrative   Caffeine Use: occasional   Regular exercise:  No         Social Determinants of Health   Financial Resource Strain: Not on file  Food Insecurity: Not on file  Transportation Needs: Not on file  Physical Activity: Not on file  Stress: Not on file  Social Connections: Not on file  Intimate Partner Violence: Not on file     Allergies  Allergen Reactions  . Budesonide-Formoterol Fumarate Cough  . Statins Other (See Comments)    Weakness and difficulty with speech with both high intensity rosuvastatin and low intensity pravastatin     Outpatient Medications Prior to Visit  Medication Sig Dispense Refill  . albuterol (PROAIR HFA) 108 (90 Base) MCG/ACT inhaler Inhale 2 puffs into the lungs every 6 (six) hours as needed for shortness of breath. 6.7 g 3  . ezetimibe (ZETIA) 10 MG tablet Take 1 tablet (10 mg total) by mouth daily. 90 tablet 3  . Tiotropium Bromide Monohydrate (SPIRIVA RESPIMAT) 1.25 MCG/ACT AERS Inhale 2 puffs into the lungs daily. 3 each 3   No facility-administered medications prior to visit.    Review of Systems  Constitutional: Negative for chills, fever, malaise/fatigue and weight loss.  HENT: Negative for hearing loss, sore throat and tinnitus.   Eyes: Negative for blurred vision and double vision.  Respiratory: Positive for shortness of breath. Negative for cough, hemoptysis, sputum  production, wheezing and stridor.   Cardiovascular: Negative for chest pain, palpitations, orthopnea, leg swelling and PND.  Gastrointestinal: Negative for abdominal pain, constipation, diarrhea, heartburn, nausea and vomiting.  Genitourinary: Negative for dysuria, hematuria and urgency.  Musculoskeletal: Negative for joint pain and myalgias.  Skin: Negative for itching and rash.  Neurological: Negative for dizziness, tingling, weakness and headaches.  Endo/Heme/Allergies: Negative for environmental allergies. Does not bruise/bleed easily.  Psychiatric/Behavioral: Negative for depression. The patient is not nervous/anxious and does not have insomnia.   All other systems reviewed and are negative.    Objective:  Physical Exam Vitals reviewed.  Constitutional:      General: She is not in acute distress.  Appearance: She is well-developed.  HENT:     Head: Normocephalic and atraumatic.     Mouth/Throat:     Pharynx: No oropharyngeal exudate.  Eyes:     Conjunctiva/sclera: Conjunctivae normal.     Pupils: Pupils are equal, round, and reactive to light.  Neck:     Vascular: No JVD.     Trachea: No tracheal deviation.     Comments: Loss of supraclavicular fat Cardiovascular:     Rate and Rhythm: Normal rate and regular rhythm.     Heart sounds: S1 normal and S2 normal.     Comments: Distant heart tones Pulmonary:     Effort: No tachypnea or accessory muscle usage.     Breath sounds: No stridor. Decreased breath sounds (throughout all lung fields) present. No wheezing, rhonchi or rales.  Abdominal:     General: Bowel sounds are normal. There is no distension.     Palpations: Abdomen is soft.     Tenderness: There is no abdominal tenderness.  Musculoskeletal:        General: Deformity (muscle wasting ) present.  Skin:    General: Skin is warm and dry.     Capillary Refill: Capillary refill takes less than 2 seconds.     Findings: No rash.  Neurological:     Mental Status:  She is alert and oriented to person, place, and time.  Psychiatric:        Behavior: Behavior normal.      Vitals:   10/23/20 0848  BP: 110/76  Pulse: 86  Temp: 97.7 F (36.5 C)  TempSrc: Tympanic  SpO2: 98%  Weight: 110 lb 8 oz (50.1 kg)  Height: 5\' 4"  (1.626 m)   98% on RA BMI Readings from Last 3 Encounters:  10/23/20 18.97 kg/m  10/03/20 19.12 kg/m  09/26/20 18.90 kg/m   Wt Readings from Last 3 Encounters:  10/23/20 110 lb 8 oz (50.1 kg)  10/03/20 111 lb 6 oz (50.5 kg)  09/26/20 110 lb 1.3 oz (49.9 kg)     CBC    Component Value Date/Time   WBC 6.7 04/18/2020 1451   RBC 5.04 04/18/2020 1451   HGB 15.1 (H) 04/18/2020 1451   HCT 45.3 04/18/2020 1451   PLT 270.0 04/18/2020 1451   MCV 90.0 04/18/2020 1451   MCHC 33.3 04/18/2020 1451   RDW 14.2 04/18/2020 1451   LYMPHSABS 1.5 11/19/2008 1026   MONOABS 0.4 11/19/2008 1026   EOSABS 0.1 11/19/2008 1026   BASOSABS 0.0 11/19/2008 1026     Chest Imaging: CT Chest 04/24/2020   Pulmonary Functions Testing Results: PFT Results Latest Ref Rng & Units 04/18/2020  FVC-Pre L 1.74  FVC-Predicted Pre % 58  FVC-Post L 1.77  FVC-Predicted Post % 59  Pre FEV1/FVC % % 64  Post FEV1/FCV % % 64  FEV1-Pre L 1.12  FEV1-Predicted Pre % 50  FEV1-Post L 1.12  DLCO uncorrected ml/min/mmHg 13.35  DLCO UNC% % 68  DLCO corrected ml/min/mmHg 13.35  DLCO COR %Predicted % 68  DLVA Predicted % 95    FeNO:   Pathology:   October 2021 Bronchoscopy: Squamous cell carcinoma   Echocardiogram:   Heart Catheterization:     Assessment & Plan:     ICD-10-CM   1. Squamous cell carcinoma of bronchus in left upper lobe (HCC)  C34.12   2. Stage 2 moderate COPD by GOLD classification ([place])  J44.9   3. Centrilobular emphysema ([place])  J43.2   4. DOE (dyspnea on exertion)  R06.00   5. Former smoker  Z87.891   73. Vaccine counseling  Z71.85     Discussion: 75 year old female, enlarging left upper lobe nodule concerning for  primary bronchogenic carcinoma status post navigational bronchoscopy tissue sampling positive for non-small cell lung cancer.  Patient with stage II COPD FEV1 of 50% predicted.  Discussed the risk benefits of proceeding with surgical evaluation versus SBRT.  Patient opted for referral to radiation oncology.  Status post 3 treatments of SBRT at this time.  Plan: Refills as needed for Spiriva plus albuterol. Follow-up with [place] in 1 year. Continue maintenance follow-up imaging with radiation oncology. Discussed vaccine counseling and recommendations for second Covid booster sometime early this fall.   Current Outpatient Medications:  .  albuterol (PROAIR HFA) 108 (90 Base) MCG/ACT inhaler, Inhale 2 puffs into the lungs every 6 (six) hours as needed for shortness of breath., Disp: 6.7 g, Rfl: 3 .  ezetimibe (ZETIA) 10 MG tablet, Take 1 tablet (10 mg total) by mouth daily., Disp: 90 tablet, Rfl: 3 .  Tiotropium Bromide Monohydrate (SPIRIVA RESPIMAT) 1.25 MCG/ACT AERS, Inhale 2 puffs into the lungs daily., Disp: 3 each, Rfl: 3   Garner Nash, DO [facility] 10/23/2020 9:13 AM

## 2020-10-25 ENCOUNTER — Ambulatory Visit (HOSPITAL_BASED_OUTPATIENT_CLINIC_OR_DEPARTMENT_OTHER)
Admission: RE | Admit: 2020-10-25 | Discharge: 2020-10-25 | Disposition: A | Discharge status: Home / Self Care | Payer: Medicare Other | Source: Ambulatory Visit | Attending: Cardiology | Admitting: Cardiology

## 2020-10-25 ENCOUNTER — Telehealth: Payer: Self-pay

## 2020-10-25 ENCOUNTER — Other Ambulatory Visit: Payer: Self-pay

## 2020-10-25 DIAGNOSIS — I700 Atherosclerosis of aorta: Secondary | ICD-10-CM | POA: Diagnosis not present

## 2020-10-25 DIAGNOSIS — I2510 Atherosclerotic heart disease of native coronary artery without angina pectoris: Secondary | ICD-10-CM | POA: Diagnosis not present

## 2020-10-25 DIAGNOSIS — I2584 Coronary atherosclerosis due to calcified coronary lesion: Secondary | ICD-10-CM | POA: Insufficient documentation

## 2020-10-25 NOTE — Telephone Encounter (Signed)
Spoke with patient regarding results and recommendation.  Patient verbalizes understanding and is agreeable to plan of care. Advised patient to call back with any issues or concerns.  

## 2020-10-25 NOTE — Telephone Encounter (Signed)
-----   Message from Richardo Priest, MD sent at 10/25/2020  1:09 PM EDT ----- Good result her aorta is normal

## 2020-11-04 ENCOUNTER — Telehealth: Payer: Self-pay | Admitting: Pulmonary Disease

## 2020-11-04 NOTE — Telephone Encounter (Signed)
Pt stated that she has enough of the spiriva to last until June but then due to her insurance she will not be able to afford this medication.  She is requesting that something different be available to her at that time.  BI please advise. Thanks

## 2020-11-05 NOTE — Telephone Encounter (Signed)
We need to determine what is covered by her insurance. She will need to call them. Maybe Anoro?  Garner Nash, DO [facility] 11/05/2020 5:21 PM

## 2020-11-06 NOTE — Telephone Encounter (Signed)
I have called and spoke with pt.  She stated that she has enough of spiriva for April, may and June.  She is going to call the first week of June and leave a message and we will get back with her about picking up some samples.

## 2020-11-06 NOTE — Telephone Encounter (Signed)
Would prefer to stay on spiriva.  Maybe we can help with cost by samples? Is she in the donut hole?  [facility] 11/06/2020 10:59 AM

## 2020-11-06 NOTE — Telephone Encounter (Signed)
Looked at patient's formulary online. Spiriva is a preferred inhaler. The following are also preferred inhalers: Advair HFA, Anoro, Breo, Incruse, Symbicort, Trelegy, Wixela, Judithann Sauger, [place], Serevent Diskus and Flovent. All are tier 3 medications with [facility]).

## 2020-11-13 ENCOUNTER — Other Ambulatory Visit: Payer: Self-pay

## 2020-11-13 DIAGNOSIS — E785 Hyperlipidemia, unspecified: Secondary | ICD-10-CM

## 2020-11-14 ENCOUNTER — Telehealth: Payer: Self-pay

## 2020-11-14 LAB — LIPID PANEL
Chol/HDL Ratio: 3 ratio (ref 0.0–4.4)
Cholesterol, Total: 216 mg/dL — ABNORMAL HIGH (ref 100–199)
HDL: 71 mg/dL (ref >39)
LDL Chol Calc (NIH): 125 mg/dL — ABNORMAL HIGH (ref 0–99)
Triglycerides: 116 mg/dL (ref 0–149)
VLDL Cholesterol Cal: 20 mg/dL (ref 5–40)

## 2020-11-14 LAB — BASIC METABOLIC PANEL
BUN/Creatinine Ratio: 15 (ref 12–28)
BUN: 11 mg/dL (ref 8–27)
CO2: 23 mmol/L (ref 20–29)
Calcium: 9.1 mg/dL (ref 8.7–10.3)
Chloride: 101 mmol/L (ref 96–106)
Creatinine, Ser: 0.75 mg/dL (ref 0.57–1.00)
Glucose: 96 mg/dL (ref 65–99)
Potassium: 4.1 mmol/L (ref 3.5–5.2)
Sodium: 139 mmol/L (ref 134–144)
eGFR: 85 mL/min/{1.73_m2} (ref >59)

## 2020-11-14 NOTE — Telephone Encounter (Signed)
Left message on patients voicemail to please return our call.   

## 2020-11-14 NOTE — Telephone Encounter (Signed)
-----   Message from Richardo Priest, MD sent at 11/14/2020 11:40 AM EDT ----- This is improved remain on Zetia

## 2020-11-15 ENCOUNTER — Telehealth: Payer: Self-pay

## 2020-11-15 NOTE — Telephone Encounter (Signed)
Left message on patients voicemail to please return our call.   

## 2020-11-15 NOTE — Telephone Encounter (Signed)
-----   Message from Richardo Priest, MD sent at 11/14/2020 11:40 AM EDT ----- This is improved remain on Zetia

## 2020-11-18 ENCOUNTER — Telehealth: Payer: Self-pay | Admitting: Family

## 2020-11-18 NOTE — Telephone Encounter (Signed)
Pt called she would a referral to a Dermatologist in [place]  Please advice

## 2020-11-18 NOTE — Telephone Encounter (Signed)
Patient trying to get a new dermatologist in the area "just want the name of a dermatologist to call for an appointment"   Please advise.

## 2020-11-19 NOTE — Telephone Encounter (Signed)
Another option would be [name] at [facility].

## 2020-11-19 NOTE — Telephone Encounter (Signed)
Please contact pt and give her derm recommendation- Dr. Jari Pigg, [facility] [place].

## 2020-11-19 NOTE — Telephone Encounter (Signed)
Gave pt this information, she says she was seeing her before but stopped because she moved about 20 minutes away. She asked if I knew of anyone closer. I gave her [facility] Information but let her know that I didn't think they were accepting pts at this time.

## 2020-11-20 NOTE — Telephone Encounter (Signed)
Pt is aware and was given address

## 2020-11-22 NOTE — Telephone Encounter (Signed)
XXXX 

## 2020-11-25 ENCOUNTER — Telehealth: Payer: Self-pay | Admitting: Pulmonary Disease

## 2020-11-25 NOTE — Telephone Encounter (Signed)
Called and spoke with Patient.  Patient wanted to know if Dr Valeta Harms recommended her to receive her second covid booster now or on early fall? Per Dr. Juline Patch LOV note 10/23/20-  Discussed vaccine counseling and recommendations for second Covid booster sometime early this fall.  Dr. Juline Patch recommendations given. Understanding stated. Nothing further at this time.

## 2020-12-03 ENCOUNTER — Telehealth: Payer: Self-pay | Admitting: Cardiology

## 2020-12-03 NOTE — Telephone Encounter (Signed)
Pt is calling In regards to her test results from 11/15/20, she would like for the results to be mailed to her.

## 2020-12-12 DIAGNOSIS — D225 Melanocytic nevi of trunk: Secondary | ICD-10-CM | POA: Diagnosis not present

## 2020-12-12 DIAGNOSIS — L821 Other seborrheic keratosis: Secondary | ICD-10-CM | POA: Diagnosis not present

## 2020-12-12 DIAGNOSIS — X32XXXS Exposure to sunlight, sequela: Secondary | ICD-10-CM | POA: Diagnosis not present

## 2020-12-12 DIAGNOSIS — L814 Other melanin hyperpigmentation: Secondary | ICD-10-CM | POA: Diagnosis not present

## 2020-12-12 DIAGNOSIS — D1801 Hemangioma of skin and subcutaneous tissue: Secondary | ICD-10-CM | POA: Diagnosis not present

## 2020-12-12 DIAGNOSIS — L2089 Other atopic dermatitis: Secondary | ICD-10-CM | POA: Diagnosis not present

## 2020-12-13 ENCOUNTER — Telehealth: Payer: Self-pay | Admitting: Cardiology

## 2020-12-13 NOTE — Telephone Encounter (Signed)
Pt states this is her 3rd attempt to get her lab results mailed to her, she wants a return cal [phone]. Please advise   Renee Navarro. Renee Navarro [address] Renee Navarro, Renee Navarro [address]

## 2020-12-13 NOTE — Telephone Encounter (Signed)
Left message on patients voicemail to please return our call.   

## 2020-12-15 DIAGNOSIS — Z23 Encounter for immunization: Secondary | ICD-10-CM | POA: Diagnosis not present

## 2020-12-16 NOTE — Telephone Encounter (Signed)
Left message on patients voicemail to please return our call.   

## 2020-12-17 NOTE — Telephone Encounter (Signed)
Left message on patients voicemail to please return our call.   I am going to close the phone note at this time as I have tried calling x3 and left voicemail's with no success.

## 2021-01-20 ENCOUNTER — Telehealth: Payer: Self-pay | Admitting: Family

## 2021-01-20 NOTE — Telephone Encounter (Signed)
Spoke with patient she stated the AWV was a waste of time and declined do not want any calls for AWV

## 2021-02-19 DIAGNOSIS — H52223 Regular astigmatism, bilateral: Secondary | ICD-10-CM | POA: Diagnosis not present

## 2021-02-19 DIAGNOSIS — H40003 Preglaucoma, unspecified, bilateral: Secondary | ICD-10-CM | POA: Diagnosis not present

## 2021-02-19 DIAGNOSIS — H524 Presbyopia: Secondary | ICD-10-CM | POA: Diagnosis not present

## 2021-02-19 DIAGNOSIS — H25813 Combined forms of age-related cataract, bilateral: Secondary | ICD-10-CM | POA: Diagnosis not present

## 2021-02-19 DIAGNOSIS — H5212 Myopia, left eye: Secondary | ICD-10-CM | POA: Diagnosis not present

## 2021-02-24 ENCOUNTER — Telehealth: Payer: Self-pay | Admitting: *Deleted

## 2021-02-24 NOTE — Telephone Encounter (Signed)
Returned patient's call, spoke with patient

## 2021-02-28 ENCOUNTER — Other Ambulatory Visit (HOSPITAL_BASED_OUTPATIENT_CLINIC_OR_DEPARTMENT_OTHER): Payer: Self-pay | Admitting: Emergency Medicine

## 2021-02-28 ENCOUNTER — Telehealth: Payer: Self-pay | Admitting: Radiology

## 2021-02-28 DIAGNOSIS — Z1231 Encounter for screening mammogram for malignant neoplasm of breast: Secondary | ICD-10-CM

## 2021-02-28 NOTE — Telephone Encounter (Signed)
Patient reports itching of neck since June 2022. She has used an over the counter medication that has relieved the itching. Patient asks if it could be caused by IMRT to left lung which was completed on 06/04/2020. Advised patient itching should not be caused by radiation therapy. Patient would like Dr Sondra Come to be made aware.

## 2021-03-20 ENCOUNTER — Telehealth: Payer: Self-pay | Admitting: *Deleted

## 2021-03-20 NOTE — Telephone Encounter (Signed)
RETURNED PATIENT'S PHONE CALL, SPOKE WITH PATIENT. ?

## 2021-03-31 DIAGNOSIS — Z20822 Contact with and (suspected) exposure to covid-19: Secondary | ICD-10-CM | POA: Diagnosis not present

## 2021-04-01 ENCOUNTER — Telehealth: Payer: Self-pay | Admitting: Radiology

## 2021-04-01 NOTE — Telephone Encounter (Signed)
Patient reports having an intermittent dull ache rated 3/10 of the right thigh since June. She had started ezetimibe recently and stopped medication in case it was the cause. The pain has since become constant. She requests a CT be done at the same time as her CT Chest prior to follow up appointment with Dr Sondra Come this week. Please advise.

## 2021-04-03 ENCOUNTER — Other Ambulatory Visit: Payer: Self-pay

## 2021-04-03 ENCOUNTER — Ambulatory Visit (HOSPITAL_COMMUNITY)
Admission: RE | Admit: 2021-04-03 | Discharge: 2021-04-03 | Disposition: A | Discharge status: Home / Self Care | Payer: Medicare Other | Source: Ambulatory Visit | Attending: Radiation Oncology | Admitting: Radiation Oncology

## 2021-04-03 DIAGNOSIS — J439 Emphysema, unspecified: Secondary | ICD-10-CM | POA: Diagnosis not present

## 2021-04-03 DIAGNOSIS — R918 Other nonspecific abnormal finding of lung field: Secondary | ICD-10-CM | POA: Diagnosis not present

## 2021-04-03 DIAGNOSIS — C3412 Malignant neoplasm of upper lobe, left bronchus or lung: Secondary | ICD-10-CM | POA: Insufficient documentation

## 2021-04-03 DIAGNOSIS — I700 Atherosclerosis of aorta: Secondary | ICD-10-CM | POA: Diagnosis not present

## 2021-04-03 DIAGNOSIS — C3490 Malignant neoplasm of unspecified part of unspecified bronchus or lung: Secondary | ICD-10-CM | POA: Diagnosis not present

## 2021-04-03 DIAGNOSIS — R911 Solitary pulmonary nodule: Secondary | ICD-10-CM | POA: Diagnosis not present

## 2021-04-03 NOTE — Telephone Encounter (Signed)
Per Dr Sondra Come, notified patient that she will need to be examined prior to ordering a CT. He will do this during her follow up of the CT Chest. Patient verbalized understanding.

## 2021-04-05 NOTE — Progress Notes (Signed)
Radiation Oncology         [phone] ________________________________  Name: Renee Navarro MRN: [id number]  Date: 04/07/2021  DOB: 09-28-1945  Follow-Up Visit Note  CC: Pcp, No  Icard, Bradley L, DO    ICD-10-CM   1. Primary cancer of left upper lobe of lung ([place])  C34.12 CT CHEST WO CONTRAST    2. Lung nodule  R91.1       Diagnosis: Squamous cell carcinoma of the left upper lobe, Clinical Stage IA1  Interval Since Last Radiation: 10 months and 3 days    Radiation Treatment Dates: 05/28/2020 through 06/04/2020 Site Technique Total Dose (Gy) Dose per Fx (Gy) Completed Fx Beam Energies  Lung, Left: Lung_Lt IMRT 54/54 18 3/3 6XFFF   Narrative:  The patient returns today for routine follow-up, she was last seen by me for follow-up on 10/03/20. Pertinent imaging since she was last seen includes a chest CT taken on 04/03/21 revealing a progressive mass-like architectural distortion with a bandlike distribution within the anterior and lateral left apex compatible with changes secondary to external beam radiation. The primary underlying lung lesion was obscured by these changes. Otherwise, no specific  findings were identified to suggest residual or tumor recurrence. Of note: the previously seen tiny, less than 4 mm, nonspecific pulmonary nodules were noted as unchanged since prior exam.   Patient reports feeling well.  She has some mild dyspnea on exertion but no breathing issues significant cough or hemoptysis.  She denies any pain within the chest area.  sHe has noticed some mild right flank pain and I recommend she follow-up with her primary care physician concerning this issue.  She denies any urinary symptoms.  Allergies:  is allergic to budesonide-formoterol fumarate and statins.  Meds: Current Outpatient Medications  Medication Sig Dispense Refill   albuterol (PROAIR HFA) 108 (90 Base) MCG/ACT inhaler Inhale 2 puffs into the lungs every 6 (six) hours as needed for shortness of  breath. 6.7 g 3   ezetimibe (ZETIA) 10 MG tablet Take 1 tablet (10 mg total) by mouth daily. 90 tablet 3   Tiotropium Bromide Monohydrate (SPIRIVA RESPIMAT) 1.25 MCG/ACT AERS Inhale 2 puffs into the lungs daily. (Patient taking differently: Inhale 2 puffs into the lungs daily. Patient taking medication every other day) 3 each 3   No current facility-administered medications for this encounter.    Physical Findings: The patient is in no acute distress. Patient is alert and oriented.  height is 5\' 4"  (1.626 m) and weight is 112 lb 3.2 oz (50.9 kg). Her temperature is 97.5 F (36.4 C) (abnormal). Her blood pressure is 123/76 and her pulse is 67. Her respiration is 18 and oxygen saturation is 100%. .  No significant changes. Lungs are clear to auscultation bilaterally. Heart has regular rate and rhythm. No palpable cervical, supraclavicular, or axillary adenopathy. Abdomen soft, non-tender, normal bowel sounds.    Lab Findings: Lab Results  Component Value Date   WBC 6.7 04/18/2020   HGB 15.1 (H) 04/18/2020   HCT 45.3 04/18/2020   MCV 90.0 04/18/2020   PLT 270.0 04/18/2020    Radiographic Findings: CT CHEST WO CONTRAST  Result Date: 04/04/2021 CLINICAL DATA:  Non-small cell lung cancer. Assess treatment response. EXAM: CT CHEST WITHOUT CONTRAST TECHNIQUE: Multidetector CT imaging of the chest was performed following the standard protocol without IV contrast. COMPARISON:  10/02/2020 FINDINGS: Cardiovascular: Heart size is normal. Coronary artery calcifications. Aortic atherosclerosis. Mediastinum/Nodes: Normal appearance of the thyroid gland. The trachea appears patent  and is midline. Normal appearance of the esophagus. No enlarged lymph nodes. Lungs/Pleura: Moderate paraseptal and centrilobular emphysema. Progressive masslike architectural distortion with a geographic distribution is noted within the anterior and lateral left apex compatible with changes secondary to external beam radiation.  This includes the area indicated by fiducial markers, image 26/5. The underlying primary lung lesion is obscured by these changes. Pleuroparenchymal scarring within the posterior right upper lobe and superior segment of right lower lobe appears unchanged, image 39/5. Similarly there is pleuroparenchymal scarring within the superior segment of the left lower lobe, also stable, image 29/5. Stable tiny subpleural nodule in the posterior left lower lobe, image 122/5. 3 mm nodule in the superior segment of the right lower lobe is also unchanged, image 60/. Upper Abdomen: No acute abnormality. Musculoskeletal: No acute or suspicious findings. Enchondroma or infarct noted in the proximal left humerus. IMPRESSION: 1. Progressive masslike architectural distortion with a bandlike distribution within the anterior and lateral left apex compatible with changes secondary to external beam radiation. The underlying primary lung lesion is obscured by these changes. No specific findings identified to suggest residual or recurrence of tumor. 2. Tiny, less than 4 mm nonspecific pulmonary nodules are unchanged from previous exam. 3. Emphysema and aortic atherosclerosis. 4. Coronary artery calcifications. Aortic Atherosclerosis (ICD10-I70.0) and Emphysema (ICD10-J43.9). Electronically Signed   By: Kerby Moors M.D.   On: 04/04/2021 08:35    Impression:  Squamous cell carcinoma of the left upper lobe, Clinical Stage IA1  No evidence of recurrence on clinical exam today.  Recent chest CT scan shows radiation changes in the site of the primary tumor but no residual mass is seen.  Plan: Patient will follow-up in 6 months.  Prior to this follow-up visit she will undergo a CT scan of the chest.   20 minutes of total time was spent for this patient encounter, including preparation, face-to-face counseling with the patient and coordination of care, physical exam, and documentation of the encounter and ordering of upcoming chest CT  scan. ____________________________________  Blair Promise, PhD, MD   This document serves as a record of services personally performed by Gery Pray, MD. It was created on his behalf by Roney Mans, a trained medical scribe. The creation of this record is based on the scribe's personal observations and the provider's statements to them. This document has been checked and approved by the attending provider.

## 2021-04-07 ENCOUNTER — Other Ambulatory Visit: Payer: Self-pay

## 2021-04-07 ENCOUNTER — Ambulatory Visit
Admission: RE | Admit: 2021-04-07 | Discharge: 2021-04-07 | Disposition: A | Discharge status: Home / Self Care | Payer: Medicare Other | Source: Ambulatory Visit | Attending: Radiation Oncology | Admitting: Radiation Oncology

## 2021-04-07 ENCOUNTER — Encounter: Payer: Self-pay | Admitting: Radiation Oncology

## 2021-04-07 VITALS — BP 123/76 | HR 67 | Temp 97.5°F | Resp 18 | Ht 64.0 in | Wt 112.2 lb

## 2021-04-07 DIAGNOSIS — R109 Unspecified abdominal pain: Secondary | ICD-10-CM | POA: Diagnosis not present

## 2021-04-07 DIAGNOSIS — Z85118 Personal history of other malignant neoplasm of bronchus and lung: Secondary | ICD-10-CM | POA: Diagnosis not present

## 2021-04-07 DIAGNOSIS — I700 Atherosclerosis of aorta: Secondary | ICD-10-CM | POA: Insufficient documentation

## 2021-04-07 DIAGNOSIS — R918 Other nonspecific abnormal finding of lung field: Secondary | ICD-10-CM | POA: Diagnosis not present

## 2021-04-07 DIAGNOSIS — I2510 Atherosclerotic heart disease of native coronary artery without angina pectoris: Secondary | ICD-10-CM | POA: Diagnosis not present

## 2021-04-07 DIAGNOSIS — Z08 Encounter for follow-up examination after completed treatment for malignant neoplasm: Secondary | ICD-10-CM | POA: Diagnosis not present

## 2021-04-07 DIAGNOSIS — Z79899 Other long term (current) drug therapy: Secondary | ICD-10-CM | POA: Insufficient documentation

## 2021-04-07 DIAGNOSIS — Z923 Personal history of irradiation: Secondary | ICD-10-CM | POA: Diagnosis not present

## 2021-04-07 DIAGNOSIS — C3412 Malignant neoplasm of upper lobe, left bronchus or lung: Secondary | ICD-10-CM | POA: Diagnosis not present

## 2021-04-07 DIAGNOSIS — R911 Solitary pulmonary nodule: Secondary | ICD-10-CM

## 2021-04-07 NOTE — Progress Notes (Signed)
Renee Navarro is here today for follow up post radiation to the lung.  Lung Side: left, patient completed treatment on 06/04/20  Does the patient complain of any of the following: Pain:Patient reports having some discomfort to right flank area.  Shortness of breath w/wo exertion: yes on exertion Cough: no Hemoptysis: no Pain with swallowing: no Swallowing/choking concerns: patient reports having a choking episode on shrimp.  Appetite: good Energy Level: reports having a great energy level.  Post radiation skin Changes: intact     Additional comments if applicable:    Vitals:   04/07/21 0958  BP: 123/76  Pulse: 67  Resp: 18  Temp: (!) 97.5 F (36.4 C)  SpO2: 100%  Weight: 112 lb 3.2 oz (50.9 kg)  Height: 5\' 4"  (1.626 m)

## 2021-04-10 ENCOUNTER — Telehealth: Payer: Self-pay | Admitting: Family

## 2021-04-10 NOTE — Telephone Encounter (Signed)
Pt. Called in and stated she wanted to know if she should move forward with receiving the new Covid boaster

## 2021-04-11 NOTE — Telephone Encounter (Signed)
Patient advised provider always recommends patients to get the booster shots. Pharmacy will let her know if she is eligible

## 2021-04-14 DIAGNOSIS — Z01419 Encounter for gynecological examination (general) (routine) without abnormal findings: Secondary | ICD-10-CM | POA: Diagnosis not present

## 2021-04-21 ENCOUNTER — Ambulatory Visit (INDEPENDENT_AMBULATORY_CARE_PROVIDER_SITE_OTHER): Payer: Medicare Other | Admitting: Family

## 2021-04-21 ENCOUNTER — Other Ambulatory Visit: Payer: Self-pay

## 2021-04-21 ENCOUNTER — Ambulatory Visit (HOSPITAL_BASED_OUTPATIENT_CLINIC_OR_DEPARTMENT_OTHER)
Admission: RE | Admit: 2021-04-21 | Discharge: 2021-04-21 | Disposition: A | Discharge status: Home / Self Care | Payer: Medicare Other | Source: Ambulatory Visit | Attending: Family | Admitting: Family

## 2021-04-21 ENCOUNTER — Encounter (HOSPITAL_BASED_OUTPATIENT_CLINIC_OR_DEPARTMENT_OTHER): Payer: Self-pay

## 2021-04-21 ENCOUNTER — Other Ambulatory Visit (HOSPITAL_BASED_OUTPATIENT_CLINIC_OR_DEPARTMENT_OTHER): Payer: Self-pay | Admitting: Family

## 2021-04-21 VITALS — BP 130/76 | HR 56 | Temp 98.2°F | Resp 16 | Wt 111.0 lb

## 2021-04-21 DIAGNOSIS — Z1231 Encounter for screening mammogram for malignant neoplasm of breast: Secondary | ICD-10-CM | POA: Diagnosis not present

## 2021-04-21 DIAGNOSIS — I2584 Coronary atherosclerosis due to calcified coronary lesion: Secondary | ICD-10-CM

## 2021-04-21 DIAGNOSIS — I2510 Atherosclerotic heart disease of native coronary artery without angina pectoris: Secondary | ICD-10-CM | POA: Diagnosis not present

## 2021-04-21 DIAGNOSIS — C3412 Malignant neoplasm of upper lobe, left bronchus or lung: Secondary | ICD-10-CM | POA: Diagnosis not present

## 2021-04-21 DIAGNOSIS — Z87891 Personal history of nicotine dependence: Secondary | ICD-10-CM | POA: Diagnosis not present

## 2021-04-21 DIAGNOSIS — Z23 Encounter for immunization: Secondary | ICD-10-CM | POA: Diagnosis not present

## 2021-04-21 DIAGNOSIS — J449 Chronic obstructive pulmonary disease, unspecified: Secondary | ICD-10-CM | POA: Diagnosis not present

## 2021-04-21 DIAGNOSIS — M545 Low back pain, unspecified: Secondary | ICD-10-CM

## 2021-04-21 DIAGNOSIS — M810 Age-related osteoporosis without current pathological fracture: Secondary | ICD-10-CM | POA: Insufficient documentation

## 2021-04-21 NOTE — Assessment & Plan Note (Signed)
She remains tobacco free.

## 2021-04-21 NOTE — Assessment & Plan Note (Signed)
Stable, management per pulmonology.

## 2021-04-21 NOTE — Progress Notes (Signed)
Subjective:   By signing my name below, I, Shehryar Baig, attest that this documentation has been prepared under the direction and in the presence of Debbrah Alar NP. 04/21/2021     Patient ID: Renee Navarro, female    DOB: [birth year], 75 y.o.   MRN: [id number]  Chief Complaint  Patient presents with   Back Pain    Complains of low back pain, since June but getting worse     Hip Pain    Complains of right hip pain     Back Pain  Hip Pain   Patient is in today for a office visit. She complains of lower back and right side hip pain since June, 2022. Her pain has worsened since. She is not taking any pain medication to manage her symptoms. She stopped taking 10 mg Zetia daily PO to manage her symptoms and finds no relief. She has a history of lung cancer.   Mammogram- She completed her mammogram today prior to this visit.  Colonoscopy- She is UTD on colonoscopy. Bone density- Last completed on 09/19/2018. Her last bone density showed she has osteopenia.  Immunization- She has received a flu vaccine during this visit.   Health Maintenance Due  Topic Date Due   Hepatitis C Screening  Never done   COVID-19 Vaccine (4 - Booster for Pfizer series) 07/27/2020   TETANUS/TDAP  02/24/2021    Past Medical History:  Diagnosis Date   Abnormal EKG 09/20/2018   Chronic bronchitis ([facility]) 10/03/2014   COPD (chronic obstructive pulmonary disease) (HCC)    Dyslipidemia    diet controlled   Dyspnea    with exertion   History of radiation therapy 05/21/2020-06/04/2020   3 SBRT treatments to left lung; Dr Gery Pray   Lung nodule 02/29/2012   3/24/2014CT Chest: no change in RUL lung nodule, likely benign. No change since 2008  No further CTs needed    Lung nodule seen on imaging study    Obstructive chronic bronchitis without exacerbation gold stage A COPD 11/17/2007   Osteoporosis    Pneumonia    x 1   Primary cancer of left upper lobe of lung ([place]) 05/09/2020   RBBB (right bundle  branch block with left anterior fascicular block) 09/22/2018   Tobacco user    quit 06/2012    Past Surgical History:  Procedure Laterality Date   BRONCHIAL BIOPSY  04/30/2020   Procedure: BRONCHIAL BIOPSIES;  Surgeon: Garner Nash, DO;  Location: [facility];  Service: Pulmonary;;   BRONCHIAL BRUSHINGS  04/30/2020   Procedure: BRONCHIAL BRUSHINGS;  Surgeon: Garner Nash, DO;  Location: [facility];  Service: Pulmonary;;   BRONCHIAL NEEDLE ASPIRATION BIOPSY  04/30/2020   Procedure: BRONCHIAL NEEDLE ASPIRATION BIOPSIES;  Surgeon: Garner Nash, DO;  Location: [facility];  Service: Pulmonary;;   BRONCHIAL WASHINGS  04/30/2020   Procedure: BRONCHIAL WASHINGS;  Surgeon: Garner Nash, DO;  Location: [facility];  Service: Pulmonary;;   colonoscopy     FIDUCIAL MARKER PLACEMENT  04/30/2020   Procedure: FIDUCIAL MARKER PLACEMENT;  Surgeon: Garner Nash, DO;  Location: [facility];  Service: Pulmonary;;   TONSILLECTOMY     TUBAL LIGATION     VIDEO BRONCHOSCOPY WITH ENDOBRONCHIAL NAVIGATION N/A 04/30/2020   Procedure: VIDEO BRONCHOSCOPY WITH ENDOBRONCHIAL NAVIGATION;  Surgeon: Garner Nash, DO;  Location: [facility];  Service: Pulmonary;  Laterality: N/A;   WISDOM TOOTH EXTRACTION     WRIST SURGERY Left     Family History  Problem Relation Age of Onset   Cancer Father        colon   Diabetes Sister    Hypertension Sister    Lymphoma Brother     Social History   Socioeconomic History   Marital status: Married    Spouse name: Not on file   Number of children: Not on file   Years of education: Not on file   Highest education level: Not on file  Occupational History   Occupation: TEACHER    Employer: [employer]    Comment: [employer]  Tobacco Use   Smoking status: Former    Packs/day: 0.75    Years: 51.00    Pack years: 38.25    Types: Cigarettes    Quit date: 07/14/2012    Years since quitting: 8.7   Smokeless tobacco: Never  Vaping Use    Vaping Use: Never used  Substance and Sexual Activity   Alcohol use: Yes    Comment: occ glass of wine   Drug use: No   Sexual activity: Not Currently    Birth control/protection: Post-menopausal  Other Topics Concern   Not on file  Social History Narrative   Caffeine Use: occasional   Regular exercise:  No         Social Determinants of Radio broadcast assistant Strain: Not on file  Food Insecurity: Not on file  Transportation Needs: Not on file  Physical Activity: Not on file  Stress: Not on file  Social Connections: Not on file  Intimate Partner Violence: Not on file    Outpatient Medications Prior to Visit  Medication Sig Dispense Refill   albuterol (PROAIR HFA) 108 (90 Base) MCG/ACT inhaler Inhale 2 puffs into the lungs every 6 (six) hours as needed for shortness of breath. 6.7 g 3   ezetimibe (ZETIA) 10 MG tablet Take 1 tablet (10 mg total) by mouth daily. 90 tablet 3   Tiotropium Bromide Monohydrate (SPIRIVA RESPIMAT) 1.25 MCG/ACT AERS Inhale 2 puffs into the lungs daily. (Patient taking differently: Inhale 2 puffs into the lungs daily. Patient taking medication every other day) 3 each 3   No facility-administered medications prior to visit.    Allergies  Allergen Reactions   Budesonide-Formoterol Fumarate Cough   Statins Other (See Comments)    Trouble urinating    Review of Systems  Musculoskeletal:  Positive for back pain and joint pain (Right hip).      Objective:    Physical Exam Constitutional:      General: She is not in acute distress.    Appearance: Normal appearance. She is not ill-appearing.  HENT:     Head: Normocephalic and atraumatic.     Right Ear: External ear normal.     Left Ear: External ear normal.  Eyes:     Extraocular Movements: Extraocular movements intact.     Pupils: Pupils are equal, round, and reactive to light.  Skin:    General: Skin is warm and dry.  Neurological:     Mental Status: She is alert and oriented to  person, place, and time.  Psychiatric:        Behavior: Behavior normal.        Judgment: Judgment normal.    BP 130/76 (BP Location: Right Arm, Patient Position: Sitting, Cuff Size: Small)   Pulse (!) 56   Temp 98.2 F (36.8 C) (Oral)   Resp 16   Wt 111 lb (50.3 kg)   LMP  (LMP Unknown)  SpO2 100%   BMI 19.05 kg/m  Wt Readings from Last 3 Encounters:  04/21/21 111 lb (50.3 kg)  04/07/21 112 lb 3.2 oz (50.9 kg)  10/23/20 110 lb 8 oz (50.1 kg)       Assessment & Plan:   Problem List Items Addressed This Visit       Unprioritized   Right-sided low back pain without sciatica    Will obtain x-ray for further evaluation.       Relevant Orders   DG Lumbar Spine Complete   Primary cancer of left upper lobe of lung [facility])    S/p radiation therapy and continues to follow with Dr. Valeta Harms (pulmonology).       Osteoporosis   Relevant Orders   DG Bone Density   History of tobacco abuse    She remains tobacco free.       COPD (chronic obstructive pulmonary disease) (HCC)    Stable, management per pulmonology.       Other Visit Diagnoses     Needs flu shot    -  Primary   Relevant Orders   Flu Vaccine QUAD High Dose(Fluad) (Completed)        No orders of the defined types were placed in this encounter.   I, Debbrah Alar NP, personally preformed the services described in this documentation.  All medical record entries made by the scribe were at my direction and in my presence.  I have reviewed the chart and discharge instructions (if applicable) and agree that the record reflects my personal performance and is accurate and complete. 04/21/2021   I,Shehryar Baig,acting as a scribe for Nance Pear, NP.,have documented all relevant documentation on the behalf of Nance Pear, NP,as directed by  Nance Pear, NP while in the presence of Nance Pear, NP.   Nance Pear, NP

## 2021-04-21 NOTE — Assessment & Plan Note (Signed)
S/p radiation therapy and continues to follow with Dr. Valeta Harms (pulmonology).

## 2021-04-21 NOTE — Assessment & Plan Note (Signed)
Will obtain x-ray for further evaluation.

## 2021-04-23 ENCOUNTER — Telehealth: Payer: Self-pay | Admitting: Cardiology

## 2021-04-23 NOTE — Telephone Encounter (Signed)
Left vm for pt to callback 

## 2021-04-23 NOTE — Telephone Encounter (Signed)
  Pt c/o medication issue:  1. Name of Medication:   ezetimibe (ZETIA) 10 MG tablet   2. How are you currently taking this medication (dosage and times per day)? Take 1 tablet (10 mg total) by mouth daily.  3. Are you having a reaction (difficulty breathing--STAT)?   4. What is your medication issue? Pt would like to speak with a nurse regarding this meds

## 2021-04-24 NOTE — Telephone Encounter (Signed)
Left message on patients voicemail to please return our call.   

## 2021-04-25 NOTE — Telephone Encounter (Signed)
Left message on patients voicemail to please return our call.   I am unsure why she states that she did not get the message from this morning. When I left her another voicemail this time I gave her the exact dates and times of when we called. I also requested that if she had another number that we could call that would be helpful.

## 2021-04-25 NOTE — Telephone Encounter (Signed)
Left message on patients voicemail to please return our call.   Letter mailed to patient at this time.  

## 2021-04-25 NOTE — Telephone Encounter (Signed)
Patient states she did not get a call this morning. She was home all morning and there was no voicemail.  She would like someone from the office to call her today

## 2021-04-29 ENCOUNTER — Ambulatory Visit (HOSPITAL_BASED_OUTPATIENT_CLINIC_OR_DEPARTMENT_OTHER)
Admission: RE | Admit: 2021-04-29 | Discharge: 2021-04-29 | Disposition: A | Discharge status: Home / Self Care | Payer: Medicare Other | Source: Ambulatory Visit | Attending: Family | Admitting: Family

## 2021-04-29 ENCOUNTER — Ambulatory Visit: Payer: Medicare Other | Attending: Internal Medicine

## 2021-04-29 ENCOUNTER — Other Ambulatory Visit: Payer: Self-pay

## 2021-04-29 DIAGNOSIS — M810 Age-related osteoporosis without current pathological fracture: Secondary | ICD-10-CM | POA: Insufficient documentation

## 2021-04-29 DIAGNOSIS — Z780 Asymptomatic menopausal state: Secondary | ICD-10-CM | POA: Diagnosis not present

## 2021-04-29 DIAGNOSIS — Z23 Encounter for immunization: Secondary | ICD-10-CM

## 2021-04-29 DIAGNOSIS — M8589 Other specified disorders of bone density and structure, multiple sites: Secondary | ICD-10-CM | POA: Diagnosis not present

## 2021-04-29 NOTE — Progress Notes (Signed)
   Covid-19 Vaccination Clinic  Name:  Renee Navarro    MRN: [id number] DOB: 1946/05/27  04/29/2021  Renee Navarro was observed post Covid-19 immunization for 15 minutes without incident. She was provided with Vaccine Information Sheet and instruction to access the V-Safe system.   Renee Navarro was instructed to call 911 with any severe reactions post vaccine: Difficulty breathing  Swelling of face and throat  A fast heartbeat  A bad rash all over body  Dizziness and weakness

## 2021-05-01 ENCOUNTER — Telehealth: Payer: Self-pay

## 2021-05-01 ENCOUNTER — Telehealth: Payer: Self-pay | Admitting: Family

## 2021-05-01 NOTE — Telephone Encounter (Signed)
Pt called requesting report findings on x-ray that was done on her back.  Pt stated she does not have Mychart.

## 2021-05-01 NOTE — Telephone Encounter (Signed)
Results of mammogram and xray mailed out to patient for her records as requested.

## 2021-05-01 NOTE — Telephone Encounter (Signed)
Dexa shows osteoporosis has worsened since the last time we checked it. I would recommend prolia injections every 6 months for bone health. If she is agreeable, please initiate insurance authorization.

## 2021-05-02 NOTE — Telephone Encounter (Signed)
Patient advised of results and provider's recommendations, she is hesitant to start injections and will like to do some research first. She will call us back if she decides to go ahead with prolia so that we can start the authorization process.

## 2021-05-05 NOTE — Telephone Encounter (Signed)
Left message on patients voicemail to please return our call.   

## 2021-05-05 NOTE — Telephone Encounter (Signed)
Patient called again and requested to speak to [facility]

## 2021-05-08 NOTE — Telephone Encounter (Signed)
Spoke to the patient just now and she let me know that she started getting lower back/hip pain in June and it got worse up until July when she stopped the zetia because she thought this pain was from this medication.   She was wanting to know if she should restart this medication since the pain has not been any better even since stopping it for several months. I advised for her to go ahead and restart the medication.   She is going to check and see if she has blocked our number in her phone. She tells me that she blocks numbers that she does not recognize and I am wondering if this is why our calls go straight to voicemail and she tells me that she does not get any voicemail's from [place].

## 2021-05-09 ENCOUNTER — Other Ambulatory Visit (HOSPITAL_BASED_OUTPATIENT_CLINIC_OR_DEPARTMENT_OTHER): Payer: Self-pay

## 2021-05-09 DIAGNOSIS — Z23 Encounter for immunization: Secondary | ICD-10-CM | POA: Diagnosis not present

## 2021-05-09 MED ORDER — COVID-19MRNA BIVAL VACC PFIZER 30 MCG/0.3ML IM SUSP
INTRAMUSCULAR | 0 refills | Status: DC
  Filled 2021-05-09: qty 0.3, 1d supply, fill #0

## 2021-06-09 ENCOUNTER — Telehealth: Payer: Self-pay | Admitting: Family

## 2021-06-09 DIAGNOSIS — M25551 Pain in right hip: Secondary | ICD-10-CM

## 2021-06-09 NOTE — Telephone Encounter (Signed)
Sure. I think most likely the pain in her hip is originating in her lower spine however and radiating into the hip. Order placed.

## 2021-06-09 NOTE — Telephone Encounter (Signed)
Patient advised order has been placed. She will call radiology to set up appointment.

## 2021-06-09 NOTE — Telephone Encounter (Signed)
Pt would like a telephone call to discuss recent x-ray [phone]. Please advise.

## 2021-06-22 DIAGNOSIS — U071 COVID-19: Secondary | ICD-10-CM | POA: Diagnosis not present

## 2021-06-26 ENCOUNTER — Ambulatory Visit (HOSPITAL_BASED_OUTPATIENT_CLINIC_OR_DEPARTMENT_OTHER)
Admission: RE | Admit: 2021-06-26 | Discharge: 2021-06-26 | Disposition: A | Discharge status: Home / Self Care | Payer: Medicare Other | Source: Ambulatory Visit | Attending: Family | Admitting: Family

## 2021-06-26 ENCOUNTER — Other Ambulatory Visit: Payer: Self-pay

## 2021-06-26 DIAGNOSIS — M25551 Pain in right hip: Secondary | ICD-10-CM | POA: Diagnosis not present

## 2021-06-30 ENCOUNTER — Telehealth: Payer: Self-pay | Admitting: Family

## 2021-06-30 NOTE — Telephone Encounter (Signed)
Pt. Called and was wanting to review imaging. Informed her that once imaging is seen by provider someone will call her with results.

## 2021-06-30 NOTE — Telephone Encounter (Signed)
Results given to patient earlier today.

## 2021-07-29 ENCOUNTER — Telehealth: Payer: Self-pay | Admitting: Pulmonary Disease

## 2021-07-30 MED ORDER — SPIRIVA RESPIMAT 1.25 MCG/ACT IN AERS: 2.0000 | INHALATION_SPRAY | Freq: Every day | RESPIRATORY_TRACT | 3 refills | Status: DC

## 2021-07-30 MED ORDER — SPIRIVA RESPIMAT 1.25 MCG/ACT IN AERS: 2.0000 | INHALATION_SPRAY | Freq: Every day | RESPIRATORY_TRACT | 0 refills | Status: DC

## 2021-07-30 NOTE — Telephone Encounter (Signed)
Called and spoke with pt. Pt clarified that she is not in the donut hole. States that she has been using her Spiriva every other day due to having bladder issues when she was taking it every day and then read side effects and saw that the issues she was having was a side effect of the medication.  Asked pt if she wanted me to check with BI about switching her to a different inhaler and pt said once she started using the Spiriva every other day, it cleared up her bladder issues. Pt also stated since she is using the Spiriva every other day, it is keeping her out of the donut hole.  Stated to pt that we could place a sample up front for her and also stated to her that we needed to renew her Rx as well so that way once she was close to running out of the sample that she would have the Rx as well coming her way and pt verbalized understanding. Renewed pt's Rx and placed sample up front for pt. Nothing further needed.

## 2021-09-18 ENCOUNTER — Telehealth: Payer: Self-pay | Admitting: *Deleted

## 2021-09-18 ENCOUNTER — Telehealth: Payer: Self-pay | Admitting: Family

## 2021-09-18 NOTE — Telephone Encounter (Signed)
Returned patient's phone call and I let her know that Dr. Sondra Come isn't in the office on Friday

## 2021-09-18 NOTE — Telephone Encounter (Signed)
Pt wanted Rod Holler to give her a call back. She did not feel comfortable saying what the call was regarding. Please advise.

## 2021-09-19 ENCOUNTER — Other Ambulatory Visit: Payer: Self-pay

## 2021-09-19 ENCOUNTER — Telehealth: Payer: Self-pay

## 2021-09-19 DIAGNOSIS — M810 Age-related osteoporosis without current pathological fracture: Secondary | ICD-10-CM

## 2021-09-19 NOTE — Telephone Encounter (Signed)
Pt would like to start Prolia.

## 2021-09-19 NOTE — Telephone Encounter (Signed)
Called patient and she will like to initiate prolia injections as advised previously by [facility]. Information sent to [place] to initiate pa with insurance. Patient was scheduled to have a BMET 10-03-21.  Ok per Debbrah Alar FNP to initiate after normal calcium levels confirmed. Rod Holler CMA

## 2021-09-22 ENCOUNTER — Telehealth: Payer: Self-pay | Admitting: Pulmonary Disease

## 2021-09-22 NOTE — Telephone Encounter (Signed)
Prolia VOB initiated via [email or web address]  [facility]

## 2021-09-22 NOTE — Telephone Encounter (Signed)
Called and LVM in regards to her spiriva. Will try again later.

## 2021-09-25 NOTE — Telephone Encounter (Signed)
Okay to schedule.  ?  ?Out-of-pocket cost due at time of visit: $0 ?

## 2021-09-25 NOTE — Telephone Encounter (Signed)
Pt ready for scheduling on or after 09/25/21 ? ?Out-of-pocket cost due at time of visit: $0 ? ?Primary: Medicare ?Prolia co-insurance: 20% (approximately $276) ?Admin fee co-insurance: 20% (approximately $25) ? ?Secondary: AARP Medicare Supp ?Prolia co-insurance: Covers Medicare Part B co-insurance and deductible.  ?Admin fee co-insurance: Covers Medicare Part B co-insurance and deductible.  ? ?Deductible: covered by secondary ? ?Prior Auth: not required ?PA# ?Valid:  ? ?** This summary of benefits is an estimation of the patient's out-of-pocket cost. Exact cost may vary based on individual plan coverage.  ? ?

## 2021-09-26 NOTE — Telephone Encounter (Signed)
Patient aware and she is scheduled for BMP 10-03-21. She will call to set up nurse visit for first prolia after getting the lab results.  ?

## 2021-10-03 ENCOUNTER — Other Ambulatory Visit (INDEPENDENT_AMBULATORY_CARE_PROVIDER_SITE_OTHER): Payer: Medicare Other

## 2021-10-03 DIAGNOSIS — M810 Age-related osteoporosis without current pathological fracture: Secondary | ICD-10-CM | POA: Diagnosis not present

## 2021-10-03 LAB — BASIC METABOLIC PANEL
BUN: 11 mg/dL (ref 6–23)
CO2: 28 mEq/L (ref 19–32)
Calcium: 9.5 mg/dL (ref 8.4–10.5)
Chloride: 101 mEq/L (ref 96–112)
Creatinine, Ser: 0.75 mg/dL (ref 0.40–1.20)
GFR: 79.06 mL/min (ref >60.00)
Glucose, Bld: 99 mg/dL (ref 70–99)
Potassium: 4.3 mEq/L (ref 3.5–5.1)
Sodium: 138 mEq/L (ref 135–145)

## 2021-10-07 ENCOUNTER — Ambulatory Visit: Payer: Medicare Other | Admitting: Cardiology

## 2021-10-08 ENCOUNTER — Other Ambulatory Visit: Payer: Self-pay | Admitting: Cardiology

## 2021-10-13 ENCOUNTER — Telehealth: Payer: Self-pay | Admitting: Family

## 2021-10-13 NOTE — Telephone Encounter (Signed)
Patient advised Calcium level was normal and she can schedule for prolia anytime.  ?She said she does not want to take prolia or do fosamax. She decided to let provider know at next appointment that she was reading about side effects and she will rather not take this medications.  ?

## 2021-10-13 NOTE — Telephone Encounter (Signed)
Patient would like to go over her 03/10 lab results. She would like a call back when available. Please advise.  ?

## 2021-10-15 ENCOUNTER — Encounter: Payer: Self-pay | Admitting: Radiology

## 2021-10-16 ENCOUNTER — Ambulatory Visit (HOSPITAL_COMMUNITY)
Admission: RE | Admit: 2021-10-16 | Discharge: 2021-10-16 | Disposition: A | Discharge status: Home / Self Care | Payer: Medicare Other | Source: Ambulatory Visit | Attending: Radiation Oncology | Admitting: Radiation Oncology

## 2021-10-16 ENCOUNTER — Other Ambulatory Visit: Payer: Self-pay

## 2021-10-16 DIAGNOSIS — J432 Centrilobular emphysema: Secondary | ICD-10-CM | POA: Diagnosis not present

## 2021-10-16 DIAGNOSIS — C3490 Malignant neoplasm of unspecified part of unspecified bronchus or lung: Secondary | ICD-10-CM | POA: Diagnosis not present

## 2021-10-16 DIAGNOSIS — C3412 Malignant neoplasm of upper lobe, left bronchus or lung: Secondary | ICD-10-CM | POA: Insufficient documentation

## 2021-10-16 DIAGNOSIS — J984 Other disorders of lung: Secondary | ICD-10-CM | POA: Diagnosis not present

## 2021-10-18 NOTE — Telephone Encounter (Signed)
Per telephone encounter 10/13/21: ? ?She said she does not want to take prolia or do fosamax. She decided to let provider know at next appointment that she was reading about side effects and she will rather not take this medications.  ? ?Pt has appt with PCP 11/07/21. ?

## 2021-10-19 NOTE — Progress Notes (Signed)
?Radiation Oncology         ([phone] ?________________________________ ? ?Name: Renee Navarro MRN: [id number]  ?Date: 10/20/2021  DOB: 1946/04/10 ? ?Follow-Up Visit Note ? ?CC: Debbrah Alar, NP  Garner Nash, DO ? ?  ICD-10-CM   ?1. Primary cancer of left upper lobe of lung ([place])  C34.12 CT CHEST WO CONTRAST  ?  ? ? ?Diagnosis: Squamous cell carcinoma of the left upper lobe, Clinical Stage IA1 ?  ?Interval Since Last Radiation: 1 year, 4 months, and 18 days    ?  ?Radiation Treatment Dates: 05/28/2020 through 06/04/2020 ?Site Technique Total Dose (Gy) Dose per Fx (Gy) Completed Fx Beam Energies  ?Lung, Left: Lung_Lt IMRT 54/54 18 3/3 6XFFF  ? ? ?Narrative:  The patient returns today for routine follow-up and to review recent imaging. She was last seen here for follow up on 04/07/21. Pertinent imaging performed since her last visit includes:  ?--Lumbar spine x-ray on 04/21/21 (prompted due to back pain) showed moderate spondylosis of the lumbar spine.  ?--Bilateral screening mammogram on 04/21/21 showed no mammographic evidence of malignancy. ?--DXA for bone density on 04/29/21 revealed a radial/forearm T-score of -2.5, classifying the patient as osteoporotic according to WHO criteria.    ?--Right hip x-ray (prompted due to hip pain) showed minimal right hip degenerative arthritis.  ?--Her most recent Chest CT on 10/16/21 demonstrated evolving radiation changes at the left lung apex, and stable right lung scarring. No evidence of local recurrence or metastatic disease appreciated.                      ? ?Otherwise, no significant interval history since the patient was last seen.  ? ?She denies any pain within the chest area significant cough or hemoptysis.  She has some dyspnea with exertion primarily when climbing a flight of stairs. ? ?Allergies:  is allergic to budesonide-formoterol fumarate and statins. ? ?Meds: ?Current Outpatient Medications  ?Medication Sig Dispense Refill  ? albuterol (PROAIR  HFA) 108 (90 Base) MCG/ACT inhaler Inhale 2 puffs into the lungs every 6 (six) hours as needed for shortness of breath. 6.7 g 3  ? ezetimibe (ZETIA) 10 MG tablet TAKE ONE TABLET BY MOUTH DAILY 90 tablet 3  ? Tiotropium Bromide Monohydrate (SPIRIVA RESPIMAT) 1.25 MCG/ACT AERS Inhale 2 puffs into the lungs daily. Patient taking medication every other day 12 g 3  ? ?No current facility-administered medications for this encounter.  ? ? ?Physical Findings: ?The patient is in no acute distress. Patient is alert and oriented.  Accompanied by her children on evaluation today ? height is 5\' 4"  (1.626 m) and weight is 113 lb 6 oz (51.4 kg). Her temporal temperature is 97.5 ?F (36.4 ?C) (abnormal). Her blood pressure is 135/97 (abnormal) and her pulse is 90. Her respiration is 18 and oxygen saturation is 99%. .  . Lungs are clear to auscultation bilaterally. Heart has regular rate and rhythm. No palpable cervical, supraclavicular, or axillary adenopathy. Abdomen soft, non-tender, normal bowel sounds.  ? ? ?Lab Findings: ?Lab Results  ?Component Value Date  ? WBC 6.7 04/18/2020  ? HGB 15.1 (H) 04/18/2020  ? HCT 45.3 04/18/2020  ? MCV 90.0 04/18/2020  ? PLT 270.0 04/18/2020  ? ? ?Radiographic Findings: ?CT CHEST WO CONTRAST ? ?Result Date: 10/18/2021 ?CLINICAL DATA:  Non-small cell lung cancer diagnosed in September 2021. Radiation therapy completed. Assess treatment response. * Tracking Code: BO * EXAM: CT CHEST WITHOUT CONTRAST TECHNIQUE: Multidetector CT imaging  of the chest was performed following the standard protocol without IV contrast. RADIATION DOSE REDUCTION: This exam was performed according to the departmental dose-optimization program which includes automated exposure control, adjustment of the mA and/or kV according to patient size and/or use of iterative reconstruction technique. COMPARISON:  Chest CT 04/03/2021 and 10/02/2020 FINDINGS: Cardiovascular: Atherosclerosis of the aorta, great vessels and coronary  arteries again noted. No acute vascular findings on noncontrast imaging. The heart size is normal. There is no pericardial effusion. Mediastinum/Nodes: There are no enlarged mediastinal, hilar or axillary lymph nodes.Small axillary and mediastinal lymph nodes appear unchanged. Hilar assessment is limited by the lack of intravenous contrast, although the hilar contours appear unchanged. The thyroid gland, trachea and esophagus demonstrate no significant findings. Lungs/Pleura: No pleural effusion or pneumothorax. Moderate paraseptal and centrilobular emphysema again noted. Evolving radiation changes at the left lung apex with increased band like opacity surrounding the fiducial markers. Scarring at the right lung apex and posteriorly along the superior aspect of the right major fissure is unchanged. No new or enlarging pulmonary nodules. Upper abdomen: The visualized upper abdomen appears stable without significant findings. Musculoskeletal/Chest wall: There is no chest wall mass or suspicious osseous finding. Dense breast tissue noted. Stable bone infarct or chondroid lesion in the proximal left humerus. IMPRESSION: 1. Evolving radiation changes at the left lung apex. No evidence of local recurrence or metastatic disease. 2. Stable right lung scarring. 3. Coronary and aortic atherosclerosis (ICD10-I70.0). Emphysema (ICD10-J43.9). Electronically Signed   By: Richardean Sale M.D.   On: 10/18/2021 12:43   ? ?Impression:  Squamous cell carcinoma of the left upper lobe, Clinical Stage IA1 ? ?No evidence of recurrence on clinical exam today.  Recent chest CT scan also very favorable. ? ?Plan: Routine follow-up in 6 months.  Prior to this follow-up appointment the patient will have a repeat CT scan of the chest for follow-up of her clinical stage I non-small cell lung cancer. ? ? ?20 minutes of total time was spent for this patient encounter, including preparation, face-to-face counseling with the patient and coordination  of care, physical exam, and documentation of the encounter. ?____________________________________ ? ?Blair Promise, PhD, MD ? ? ?This document serves as a record of services personally performed by Gery Pray, MD. It was created on his behalf by Roney Mans, a trained medical scribe. The creation of this record is based on the scribe's personal observations and the provider's statements to them. This document has been checked and approved by the attending provider. ? ?

## 2021-10-20 ENCOUNTER — Other Ambulatory Visit: Payer: Self-pay

## 2021-10-20 ENCOUNTER — Ambulatory Visit
Admission: RE | Admit: 2021-10-20 | Discharge: 2021-10-20 | Disposition: A | Discharge status: Home / Self Care | Payer: Medicare Other | Source: Ambulatory Visit | Attending: Radiation Oncology | Admitting: Radiation Oncology

## 2021-10-20 ENCOUNTER — Encounter: Payer: Self-pay | Admitting: Radiation Oncology

## 2021-10-20 VITALS — BP 135/97 | HR 90 | Temp 97.5°F | Resp 18 | Ht 64.0 in | Wt 113.4 lb

## 2021-10-20 DIAGNOSIS — M549 Dorsalgia, unspecified: Secondary | ICD-10-CM | POA: Insufficient documentation

## 2021-10-20 DIAGNOSIS — Z08 Encounter for follow-up examination after completed treatment for malignant neoplasm: Secondary | ICD-10-CM | POA: Diagnosis not present

## 2021-10-20 DIAGNOSIS — I700 Atherosclerosis of aorta: Secondary | ICD-10-CM | POA: Diagnosis not present

## 2021-10-20 DIAGNOSIS — M47816 Spondylosis without myelopathy or radiculopathy, lumbar region: Secondary | ICD-10-CM | POA: Diagnosis not present

## 2021-10-20 DIAGNOSIS — J432 Centrilobular emphysema: Secondary | ICD-10-CM | POA: Diagnosis not present

## 2021-10-20 DIAGNOSIS — Z923 Personal history of irradiation: Secondary | ICD-10-CM | POA: Diagnosis not present

## 2021-10-20 DIAGNOSIS — C3412 Malignant neoplasm of upper lobe, left bronchus or lung: Secondary | ICD-10-CM

## 2021-10-20 DIAGNOSIS — Z85118 Personal history of other malignant neoplasm of bronchus and lung: Secondary | ICD-10-CM | POA: Insufficient documentation

## 2021-10-20 NOTE — Progress Notes (Signed)
Renee Navarro is here today for follow up post radiation to the lung. ? ?Lung Side: Left, patient completed treatment on 06/04/20. ? ?Does the patient complain of any of the following: ?Pain:denies ?Shortness of breath w/wo exertion: shortness of breath with exertion ?Cough: denies ?Hemoptysis: denies ?Pain with swallowing: denies ?Swallowing/choking concerns: denies ?Appetite: good ?Energy Level: good ?Post radiation skin Changes: denies ? ? ? ?Additional comments if applicable: nothing of note ? ?Vitals:  ? 10/20/21 1010  ?BP: (!) 135/97  ?Pulse: 90  ?Resp: 18  ?Temp: (!) 97.5 ?F (36.4 ?C)  ?TempSrc: Temporal  ?SpO2: 99%  ?Weight: 113 lb 6 oz (51.4 kg)  ?Height: 5\' 4"  (1.626 m)  ? ? ?

## 2021-10-21 DIAGNOSIS — Z20822 Contact with and (suspected) exposure to covid-19: Secondary | ICD-10-CM | POA: Diagnosis not present

## 2021-10-22 ENCOUNTER — Ambulatory Visit: Payer: Medicare Other | Admitting: Family

## 2021-10-23 ENCOUNTER — Encounter: Payer: Self-pay | Admitting: Pulmonary Disease

## 2021-10-23 ENCOUNTER — Telehealth: Payer: Self-pay | Admitting: *Deleted

## 2021-10-23 ENCOUNTER — Ambulatory Visit (INDEPENDENT_AMBULATORY_CARE_PROVIDER_SITE_OTHER): Payer: Medicare Other | Admitting: Pulmonary Disease

## 2021-10-23 VITALS — BP 110/70 | HR 76 | Ht 64.0 in | Wt 112.0 lb

## 2021-10-23 DIAGNOSIS — J449 Chronic obstructive pulmonary disease, unspecified: Secondary | ICD-10-CM

## 2021-10-23 DIAGNOSIS — R0609 Other forms of dyspnea: Secondary | ICD-10-CM

## 2021-10-23 DIAGNOSIS — J432 Centrilobular emphysema: Secondary | ICD-10-CM

## 2021-10-23 DIAGNOSIS — C3412 Malignant neoplasm of upper lobe, left bronchus or lung: Secondary | ICD-10-CM

## 2021-10-23 DIAGNOSIS — Z87891 Personal history of nicotine dependence: Secondary | ICD-10-CM | POA: Diagnosis not present

## 2021-10-23 MED ORDER — TRELEGY ELLIPTA 100-62.5-25 MCG/ACT IN AEPB: 1.0000 | INHALATION_SPRAY | Freq: Every day | RESPIRATORY_TRACT | 0 refills | Status: DC

## 2021-10-23 MED ORDER — ALBUTEROL SULFATE HFA 108 (90 BASE) MCG/ACT IN AERS: 2.0000 | INHALATION_SPRAY | Freq: Four times a day (QID) | RESPIRATORY_TRACT | 3 refills | Status: AC | PRN

## 2021-10-23 NOTE — Progress Notes (Signed)
? ?Synopsis: Referred in Oct 2021 for lung nodule by Debbrah Alar, NP ? ?Subjective:  ? ?PATIENT ID: Renee Navarro GENDER: female DOB: 1946/06/30, MRN: [id number] ? ?Chief Complaint  ?Patient presents with  ? Follow-up  ?  Pt states she has been doing okay since last visit and denies any complaints.  ? ? ?76 yo FM, PMH COPD, former smoker, quit 7 years ago, enrolled in LDCT screening. New LUL nodule that was no present in comparison to previous images. Recent CT chest with LUL nodule 1.2 cm in largest cross section. She was referred to see me in consultation for videobronchoscopy, navigation and fiducial placement. She has severe COPD Stage 3 based on FEV1 50% predicted. She does not want to have lobectomy. I discussed with her at length about the options of surgery vs radiation therapy. She has opted to pursue radiation therapy if that is an option but needing to proceed with bx first. From a copd standpoint she occasionally uses albuterol and that's it. She is not on any maintence inhalers.  ? ?OV 10/23/2020: Here today for follow-up.  Last seen in office via televisit.  Since patient was last seen she underwent video bronchoscopy with tissue sampling of left upper lobe nodule confirming non-small cell lung cancer.  Patient underwent 3 SBRT treatments to the left lung by Dr. Sondra Come radiation oncology. Doing well. DOE is still present. She is using her inhalers regularly, Spiriva plus as needed albuterol.  She does not need refills today.  She has them sent to mail in pharmacy. ? ?OV 10/23/2021: Here today for follow up.  Doing well.  No complaints today.  Still using Stiolto.  She does have some trouble using the Stiolto inhaler.  She feels like she has had some urinary issues related to the Spiriva she has been on in the past.  She is been using it every other day. ? ? ?Past Medical History:  ?Diagnosis Date  ? Abnormal EKG 09/20/2018  ? Chronic bronchitis ([place]) 10/03/2014  ? COPD (chronic obstructive  pulmonary disease) ([place])   ? Dyslipidemia   ? diet controlled  ? Dyspnea   ? with exertion  ? History of radiation therapy 05/28/2020-06/04/2020  ? 3 SBRT treatments to left lung; Dr Gery Pray  ? Lung nodule 02/29/2012  ? 3/24/2014CT Chest: no change in RUL lung nodule, likely benign. No change since 2008  No further CTs needed   ? Lung nodule seen on imaging study   ? Obstructive chronic bronchitis without exacerbation gold stage A COPD 11/17/2007  ? Osteoporosis   ? Pneumonia   ? x 1  ? Primary cancer of left upper lobe of lung ([place]) 05/09/2020  ? 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 Tobacco user   ? quit 06/2012  ?  ? ?Family History  ?Problem Relation Age of Onset  ? Cancer Father   ?     colon  ? Diabetes Sister   ? Hypertension Sister   ? Lymphoma Brother   ?  ? ?Past Surgical History:  ?Procedure Laterality Date  ? BRONCHIAL BIOPSY  04/30/2020  ? Procedure: BRONCHIAL BIOPSIES;  Surgeon: Garner Nash, DO;  Location: [facility];  Service: Pulmonary;;  ? BRONCHIAL BRUSHINGS  04/30/2020  ? Procedure: BRONCHIAL BRUSHINGS;  Surgeon: Garner Nash, DO;  Location: [facility];  Service: Pulmonary;;  ? BRONCHIAL NEEDLE ASPIRATION BIOPSY  04/30/2020  ? Procedure: BRONCHIAL NEEDLE ASPIRATION BIOPSIES;  Surgeon: Garner Nash,  DO;  Location: [facility];  Service: Pulmonary;;  ? BRONCHIAL WASHINGS  04/30/2020  ? Procedure: BRONCHIAL WASHINGS;  Surgeon: Garner Nash, DO;  Location: [facility];  Service: Pulmonary;;  ? colonoscopy    ? FIDUCIAL MARKER PLACEMENT  04/30/2020  ? Procedure: FIDUCIAL MARKER PLACEMENT;  Surgeon: Garner Nash, DO;  Location: [facility];  Service: Pulmonary;;  ? TONSILLECTOMY    ? TUBAL LIGATION    ? VIDEO BRONCHOSCOPY WITH ENDOBRONCHIAL NAVIGATION N/A 04/30/2020  ? Procedure: VIDEO BRONCHOSCOPY WITH ENDOBRONCHIAL NAVIGATION;  Surgeon: Garner Nash, DO;  Location: [facility];  Service: Pulmonary;  Laterality: N/A;  ? WISDOM  TOOTH EXTRACTION    ? WRIST SURGERY Left   ? ? ?Social History  ? ?Socioeconomic History  ? Marital status: Married  ?  Spouse name: Not on file  ? Number of children: Not on file  ? Years of education: Not on file  ? Highest education level: Not on file  ?Occupational History  ? Occupation: TEACHER  ?  Employer: [employer]  ?  Comment: [employer]  ?Tobacco Use  ? Smoking status: Former  ?  Packs/day: 0.75  ?  Years: 51.00  ?  Pack years: 38.25  ?  Types: Cigarettes  ?  Quit date: 07/14/2012  ?  Years since quitting: 9.2  ? Smokeless tobacco: Never  ?Vaping Use  ? Vaping Use: Never used  ?Substance and Sexual Activity  ? Alcohol use: Yes  ?  Comment: occ glass of wine  ? Drug use: No  ? Sexual activity: Not Currently  ?  Birth control/protection: Post-menopausal  ?Other Topics Concern  ? Not on file  ?Social History Narrative  ? Caffeine Use: occasional  ? Regular exercise:  No  ?   ?   ? ?Social Determinants of Health  ? ?Financial Resource Strain: Not on file  ?Food Insecurity: Not on file  ?Transportation Needs: Not on file  ?Physical Activity: Not on file  ?Stress: Not on file  ?Social Connections: Not on file  ?Intimate Partner Violence: Not on file  ?  ? ?Allergies  ?Allergen Reactions  ? Budesonide-Formoterol Fumarate Cough  ? Statins Other (See Comments)  ?  Trouble urinating  ?  ? ?Outpatient Medications Prior to Visit  ?Medication Sig Dispense Refill  ? ezetimibe (ZETIA) 10 MG tablet TAKE ONE TABLET BY MOUTH DAILY 90 tablet 3  ? Tiotropium Bromide Monohydrate (SPIRIVA RESPIMAT) 1.25 MCG/ACT AERS Inhale 2 puffs into the lungs daily. Patient taking medication every other day 12 g 3  ? albuterol (PROAIR HFA) 108 (90 Base) MCG/ACT inhaler Inhale 2 puffs into the lungs every 6 (six) hours as needed for shortness of breath. 6.7 g 3  ? ?No facility-administered medications prior to visit.  ? ? ?Review of Systems  ?Constitutional:  Negative for chills, fever, malaise/fatigue and weight loss.  ?HENT:  Negative  for hearing loss, sore throat and tinnitus.   ?Eyes:  Negative for blurred vision and double vision.  ?Respiratory:  Positive for shortness of breath. Negative for cough, hemoptysis, sputum production, wheezing and stridor.   ?Cardiovascular:  Negative for chest pain, palpitations, orthopnea, leg swelling and PND.  ?Gastrointestinal:  Negative for abdominal pain, constipation, diarrhea, heartburn, nausea and vomiting.  ?Genitourinary:  Negative for dysuria, hematuria and urgency.  ?Musculoskeletal:  Negative for joint pain and myalgias.  ?Skin:  Negative for itching and rash.  ?Neurological:  Negative for dizziness, tingling, weakness and headaches.  ?Endo/Heme/Allergies:  Negative  for environmental allergies. Does not bruise/bleed easily.  ?Psychiatric/Behavioral:  Negative for depression. The patient is not nervous/anxious and does not have insomnia.   ?All other systems reviewed and are negative. ? ? ?Objective:  ?Physical Exam ?Vitals reviewed.  ?Constitutional:   ?   General: She is not in acute distress. ?   Appearance: She is well-developed.  ?   Comments: Elderly thin  ?HENT:  ?   Head: Normocephalic and atraumatic.  ?   Mouth/Throat:  ?   Pharynx: No oropharyngeal exudate.  ?Eyes:  ?   Conjunctiva/sclera: Conjunctivae normal.  ?   Pupils: Pupils are equal, round, and reactive to light.  ?Neck:  ?   Vascular: No JVD.  ?   Trachea: No tracheal deviation.  ?   Comments: Loss of supraclavicular fat ?Cardiovascular:  ?   Rate and Rhythm: Normal rate and regular rhythm.  ?   Heart sounds: S1 normal and S2 normal.  ?   Comments: Distant heart tones ?Pulmonary:  ?   Effort: No tachypnea or accessory muscle usage.  ?   Breath sounds: No stridor. Decreased breath sounds (throughout all lung fields) present. No wheezing, rhonchi or rales.  ?Abdominal:  ?   General: Bowel sounds are normal. There is no distension.  ?   Palpations: Abdomen is soft.  ?   Tenderness: There is no abdominal tenderness.  ?Musculoskeletal:      ?   General: Deformity (muscle wasting ) present.  ?Skin: ?   General: Skin is warm and dry.  ?   Capillary Refill: Capillary refill takes less than 2 seconds.  ?   Findings: No rash.  ?Neurological:  ?

## 2021-10-23 NOTE — Telephone Encounter (Signed)
RETURNED PATIENT'S PHONE CALL, SPOKE WITH PATIENT. ?

## 2021-10-23 NOTE — Patient Instructions (Signed)
Thank you for visiting Dr. Valeta Harms at [facility] : [facility]. ?Today we recommend the following: ? ?Continue spirivia and albuterol as needed ?Samples today  ? ?Return in about 1 year (around 10/24/2022), or if symptoms worsen or fail to improve, for with APP or Dr. Valeta Harms. ? ? ? ?Please do your part to reduce the spread of COVID-19.  ? ?

## 2021-11-05 NOTE — Telephone Encounter (Signed)
Patient was switched to Trelegy 118mcg.  ?

## 2021-11-07 ENCOUNTER — Ambulatory Visit: Payer: Medicare Other | Admitting: Family

## 2021-11-10 DIAGNOSIS — Z923 Personal history of irradiation: Secondary | ICD-10-CM | POA: Insufficient documentation

## 2021-11-13 ENCOUNTER — Encounter: Payer: Self-pay | Admitting: Cardiology

## 2021-11-13 ENCOUNTER — Ambulatory Visit (INDEPENDENT_AMBULATORY_CARE_PROVIDER_SITE_OTHER): Payer: Medicare Other | Admitting: Cardiology

## 2021-11-13 VITALS — BP 116/64 | HR 61 | Ht 64.0 in | Wt 111.1 lb

## 2021-11-13 DIAGNOSIS — I444 Left anterior fascicular block: Secondary | ICD-10-CM

## 2021-11-13 DIAGNOSIS — I700 Atherosclerosis of aorta: Secondary | ICD-10-CM

## 2021-11-13 DIAGNOSIS — J449 Chronic obstructive pulmonary disease, unspecified: Secondary | ICD-10-CM | POA: Diagnosis not present

## 2021-11-13 DIAGNOSIS — I2584 Coronary atherosclerosis due to calcified coronary lesion: Secondary | ICD-10-CM

## 2021-11-13 DIAGNOSIS — E7800 Pure hypercholesterolemia, unspecified: Secondary | ICD-10-CM

## 2021-11-13 DIAGNOSIS — I2510 Atherosclerotic heart disease of native coronary artery without angina pectoris: Secondary | ICD-10-CM

## 2021-11-13 NOTE — Patient Instructions (Signed)
Medication Instructions:  ?Your physician recommends that you continue on your current medications as directed. Please refer to the Current Medication list given to you today. ? ?*If you need a refill on your cardiac medications before your next appointment, please call your pharmacy* ? ? ?Lab Work: ?Your physician recommends that you return for lab work in:  ? ?Labs today in [address]: CMP, Lipids ? ?If you have labs (blood work) drawn today and your tests are completely normal, you will receive your results only by: ?MyChart Message (if you have MyChart) OR ?A paper copy in the mail ?If you have any lab test that is abnormal or we need to change your treatment, we will call you to review the results. ? ? ?Testing/Procedures: ?None ? ? ?Follow-Up: ?At [facility], you and your health needs are our priority.  As part of our continuing mission to provide you with exceptional heart care, we have created designated Provider Care Teams.  These Care Teams include your primary Cardiologist (physician) and Advanced Practice Providers (APPs -  Physician Assistants and Nurse Practitioners) who all work together to provide you with the care you need, when you need it. ? ?We recommend signing up for the patient portal called "MyChart".  Sign up information is provided on this After Visit Summary.  MyChart is used to connect with patients for Virtual Visits (Telemedicine).  Patients are able to view lab/test results, encounter notes, upcoming appointments, etc.  Non-urgent messages can be sent to your provider as well.   ?To learn more about what you can do with MyChart, go to [email or web address].   ? ?Your next appointment:   ?1 year(s) ? ?The format for your next appointment:   ?In Person ? ?Provider:   ?Shirlee More, MD  ? ? ?Other Instructions ?Sign up in [place] ? ?Important Information About Sugar ? ? ? ? ?  ?

## 2021-11-13 NOTE — Progress Notes (Signed)
?Cardiology Office Note:   ? ?Date:  11/13/2021  ? ?ID:  Renee Navarro, DOB 1946-06-27, MRN [id number] ? ?PCP:  Debbrah Alar, NP  ?Cardiologist:  Shirlee More, MD   ? ?Referring MD: Debbrah Alar, NP  ? ? ?ASSESSMENT:   ? ?1. Coronary artery calcification   ?2. Aortic atherosclerosis ([place])   ?3. Pure hypercholesterolemia   ?4. Left anterior hemiblock   ?5. Chronic obstructive pulmonary disease, unspecified COPD type ([place])   ? ?PLAN:   ? ?In order of problems listed above: ? ?Clinically is doing well on appropriate therapy lipid-lowering check lipid profile of his second medication as needed bempedoic acid not having angina and she declines an ischemia evaluation ?Stable EKG pattern although I would say now it is more consistent with chronic pulmonary disease ?Stable COPD I encouraged her to go ahead and be treated for osteoporosis ? ? ?Next appointment: 1 year no longer do ? ? ?Medication Adjustments/Labs and Tests Ordered: ?Current medicines are reviewed at length with the patient today.  Concerns regarding medicines are outlined above.  ?No orders of the defined types were placed in this encounter. ? ?No orders of the defined types were placed in this encounter. ? ? ?Chief Complaint  ?Patient presents with  ? Follow-up  ?  Coronary and aortic atherosclerosis on CT  ? ? ?History of Present Illness:   ? ?Renee Navarro is a 76 y.o. female with a hx of coronary artery calcification and aortic atherosclerosis incidentally found on CT scan, bifascicular heart block obstructive chronic bronchitis Gold stage a COPD and hyperlipidemia  last seen 09/26/2020 with intensification of lipid-lowering therapy adding a Zetia to her statin. ?She had back pain she has osteoporosis and transiently interrupted her Zetia she restarted in December ?She has no chest pain has chronic shortness of breath due to COPD and had 1 episode of brief rapid heart rate self-limited ?Compliance with diet, lifestyle and medications: Yes ?Her  last lipid profile a year ago cholesterol 216 LDL 125 ?Past Medical History:  ?Diagnosis Date  ? Abnormal EKG 09/20/2018  ? Chronic bronchitis ([place]) 10/03/2014  ? COPD (chronic obstructive pulmonary disease) ([place])   ? Dyslipidemia   ? diet controlled  ? Dyspnea   ? with exertion  ? History of radiation therapy 05/28/2020-06/04/2020  ? 3 SBRT treatments to left lung; Dr Gery Pray  ? Lung nodule 02/29/2012  ? 3/24/2014CT Chest: no change in RUL lung nodule, likely benign. No change since 2008  No further CTs needed   ? Lung nodule seen on imaging study   ? Obstructive chronic bronchitis without exacerbation gold stage A COPD 11/17/2007  ? Osteoporosis   ? Pneumonia   ? x 1  ? Primary cancer of left upper lobe of lung ([place]) 05/09/2020  ? 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 Tobacco user   ? quit 06/2012  ? ? ?Past Surgical History:  ?Procedure Laterality Date  ? BRONCHIAL BIOPSY  04/30/2020  ? Procedure: BRONCHIAL BIOPSIES;  Surgeon: Garner Nash, DO;  Location: [facility];  Service: Pulmonary;;  ? BRONCHIAL BRUSHINGS  04/30/2020  ? Procedure: BRONCHIAL BRUSHINGS;  Surgeon: Garner Nash, DO;  Location: [facility];  Service: Pulmonary;;  ? BRONCHIAL NEEDLE ASPIRATION BIOPSY  04/30/2020  ? Procedure: BRONCHIAL NEEDLE ASPIRATION BIOPSIES;  Surgeon: Garner Nash, DO;  Location: [facility];  Service: Pulmonary;;  ? BRONCHIAL WASHINGS  04/30/2020  ? Procedure: BRONCHIAL WASHINGS;  Surgeon: June Leap  L, DO;  Location: [facility];  Service: Pulmonary;;  ? colonoscopy    ? FIDUCIAL MARKER PLACEMENT  04/30/2020  ? Procedure: FIDUCIAL MARKER PLACEMENT;  Surgeon: Garner Nash, DO;  Location: [facility];  Service: Pulmonary;;  ? TONSILLECTOMY    ? TUBAL LIGATION    ? VIDEO BRONCHOSCOPY WITH ENDOBRONCHIAL NAVIGATION N/A 04/30/2020  ? Procedure: VIDEO BRONCHOSCOPY WITH ENDOBRONCHIAL NAVIGATION;  Surgeon: Garner Nash, DO;  Location: [facility];  Service:  Pulmonary;  Laterality: N/A;  ? WISDOM TOOTH EXTRACTION    ? WRIST SURGERY Left   ? ? ?Current Medications: ?Current Meds  ?Medication Sig  ? albuterol (PROAIR HFA) 108 (90 Base) MCG/ACT inhaler Inhale 2 puffs into the lungs every 6 (six) hours as needed for shortness of breath.  ? ezetimibe (ZETIA) 10 MG tablet TAKE ONE TABLET BY MOUTH DAILY  ? Tiotropium Bromide Monohydrate (SPIRIVA RESPIMAT) 1.25 MCG/ACT AERS Inhale 2 puffs into the lungs daily. Patient taking medication every other day  ?  ? ?Allergies:   Budesonide-formoterol fumarate and Statins  ? ?Social History  ? ?Socioeconomic History  ? Marital status: Married  ?  Spouse name: Not on file  ? Number of children: Not on file  ? Years of education: Not on file  ? Highest education level: Not on file  ?Occupational History  ? Occupation: TEACHER  ?  Employer: [employer]  ?  Comment: [employer]  ?Tobacco Use  ? Smoking status: Former  ?  Packs/day: 0.75  ?  Years: 51.00  ?  Pack years: 38.25  ?  Types: Cigarettes  ?  Quit date: 07/14/2012  ?  Years since quitting: 9.3  ?  Passive exposure: Past  ? Smokeless tobacco: Never  ?Vaping Use  ? Vaping Use: Never used  ?Substance and Sexual Activity  ? Alcohol use: Yes  ?  Comment: occ glass of wine  ? Drug use: No  ? Sexual activity: Not Currently  ?  Birth control/protection: Post-menopausal  ?Other Topics Concern  ? Not on file  ?Social History Narrative  ? Caffeine Use: occasional  ? Regular exercise:  No  ?   ?   ? ?Social Determinants of Health  ? ?Financial Resource Strain: Not on file  ?Food Insecurity: Not on file  ?Transportation Needs: Not on file  ?Physical Activity: Not on file  ?Stress: Not on file  ?Social Connections: Not on file  ?  ? ?Family History: ?The patient's family history includes Cancer in her father; Diabetes in her sister; Hypertension in her sister; Lymphoma in her brother. ?ROS:   ?Please see the history of present illness.    ?All other systems reviewed and are  negative. ? ?EKGs/Labs/Other Studies Reviewed:   ? ?The following studies were reviewed today: ? ?EKG:  EKG ordered today and personally reviewed.  The ekg ordered today demonstrates COPD pattern sinus rhythm right atrial enlargement deep S waves in V5 and V6 ? ?Recent Labs: ?10/03/2021: BUN 11; Creatinine, Ser 0.75; Potassium 4.3; Sodium 138  ?Recent Lipid Panel ?   ?Component Value Date/Time  ? CHOL 216 (H) 11/13/2020 0935  ? TRIG 116 11/13/2020 0935  ? HDL 71 11/13/2020 0935  ? CHOLHDL 3.0 11/13/2020 0935  ? CHOLHDL 3 12/15/2017 0905  ? VLDL 20.6 12/15/2017 0905  ? [place] 125 (H) 11/13/2020 0935  ? LDLDIRECT 198.1 11/19/2008 1026  ? ? ?Physical Exam:   ? ?VS:  BP 116/64 (BP Location: Right Arm)   Pulse 61  Ht 5\' 4"  (1.626 m)   Wt 111 lb 1.9 oz (50.4 kg)   LMP  (LMP Unknown)   SpO2 97%   BMI 19.07 kg/m?    ? ?Wt Readings from Last 3 Encounters:  ?11/13/21 111 lb 1.9 oz (50.4 kg)  ?10/23/21 112 lb (50.8 kg)  ?10/20/21 113 lb 6 oz (51.4 kg)  ?  ? ?GEN: Very small stature COPD appearancein no acute distress ?HEENT: Normal ?NECK: No JVD; No carotid bruits ?LYMPHATICS: No lymphadenopathy ?CARDIAC: Distant heart sounds RRR, no murmurs, rubs, gallops ?RESPIRATORY:  Clear to auscultation without rales, wheezing or rhonchi  ?ABDOMEN: Soft, non-tender, non-distended ?MUSCULOSKELETAL:  No edema; No deformity  ?SKIN: Warm and dry ?NEUROLOGIC:  Alert and oriented x 3 ?PSYCHIATRIC:  Normal affect  ? ? ?Signed, ?Shirlee More, MD  ?11/13/2021 9:35 AM    ?[place]  ?

## 2021-11-13 NOTE — Addendum Note (Signed)
Addended by: Edwyna Shell I on: 11/13/2021 09:47 AM ? ? Modules accepted: Orders ? ?

## 2021-11-14 LAB — COMPREHENSIVE METABOLIC PANEL
ALT: 33 IU/L — ABNORMAL HIGH (ref 0–32)
AST: 27 IU/L (ref 0–40)
Albumin/Globulin Ratio: 1.9 (ref 1.2–2.2)
Albumin: 4.7 g/dL (ref 3.7–4.7)
Alkaline Phosphatase: 76 IU/L (ref 44–121)
BUN/Creatinine Ratio: 19 (ref 12–28)
BUN: 13 mg/dL (ref 8–27)
Bilirubin Total: 0.3 mg/dL (ref 0.0–1.2)
CO2: 24 mmol/L (ref 20–29)
Calcium: 9.2 mg/dL (ref 8.7–10.3)
Chloride: 102 mmol/L (ref 96–106)
Creatinine, Ser: 0.7 mg/dL (ref 0.57–1.00)
Globulin, Total: 2.5 g/dL (ref 1.5–4.5)
Glucose: 90 mg/dL (ref 70–99)
Potassium: 4.3 mmol/L (ref 3.5–5.2)
Sodium: 140 mmol/L (ref 134–144)
Total Protein: 7.2 g/dL (ref 6.0–8.5)
eGFR: 91 mL/min/{1.73_m2} (ref >59)

## 2021-11-14 LAB — LIPID PANEL
Chol/HDL Ratio: 2.7 ratio (ref 0.0–4.4)
Cholesterol, Total: 218 mg/dL — ABNORMAL HIGH (ref 100–199)
HDL: 82 mg/dL (ref >39)
LDL Chol Calc (NIH): 111 mg/dL — ABNORMAL HIGH (ref 0–99)
Triglycerides: 148 mg/dL (ref 0–149)
VLDL Cholesterol Cal: 25 mg/dL (ref 5–40)

## 2021-11-20 DIAGNOSIS — Z20822 Contact with and (suspected) exposure to covid-19: Secondary | ICD-10-CM | POA: Diagnosis not present

## 2021-11-21 ENCOUNTER — Telehealth: Payer: Self-pay

## 2021-11-21 ENCOUNTER — Ambulatory Visit (INDEPENDENT_AMBULATORY_CARE_PROVIDER_SITE_OTHER): Payer: Medicare Other | Admitting: Family

## 2021-11-21 VITALS — BP 117/71 | HR 65 | Temp 97.9°F | Resp 16 | Wt 111.6 lb

## 2021-11-21 DIAGNOSIS — C3412 Malignant neoplasm of upper lobe, left bronchus or lung: Secondary | ICD-10-CM

## 2021-11-21 DIAGNOSIS — J449 Chronic obstructive pulmonary disease, unspecified: Secondary | ICD-10-CM | POA: Diagnosis not present

## 2021-11-21 DIAGNOSIS — M810 Age-related osteoporosis without current pathological fracture: Secondary | ICD-10-CM | POA: Diagnosis not present

## 2021-11-21 DIAGNOSIS — I2510 Atherosclerotic heart disease of native coronary artery without angina pectoris: Secondary | ICD-10-CM

## 2021-11-21 DIAGNOSIS — I2584 Coronary atherosclerosis due to calcified coronary lesion: Secondary | ICD-10-CM

## 2021-11-21 MED ORDER — DENOSUMAB 60 MG/ML ~~LOC~~ SOSY
60.0000 mg | PREFILLED_SYRINGE | Freq: Once | SUBCUTANEOUS | Status: AC
  Administered 2021-11-21: 60 mg via SUBCUTANEOUS

## 2021-11-21 NOTE — Assessment & Plan Note (Signed)
Discussed risks/benefits of Prolia vs Fosamax. Pt would like to proceed with Prolia. Injection given today.  ?

## 2021-11-21 NOTE — Progress Notes (Signed)
? ?Subjective:  ? ? ? Patient ID: Renee Navarro, female    DOB: 29-Mar-1946, 76 y.o.   MRN: [id number] ? ?Chief Complaint  ?Patient presents with  ? Osteoporosis  ?  Here for follow up, has questions about prolia  ? ? ?HPI ?Patient is in today for follow up. ? ?Osteoporosis- She would like to discuss treatment. ? ?Lung CA- CT performed back in March 2023 noted No evidence of ?local recurrence or metastatic disease. ? ?Health Maintenance Due  ?Topic Date Due  ? Hepatitis C Screening  Never done  ? TETANUS/TDAP  02/24/2021  ? ? ?Past Medical History:  ?Diagnosis Date  ? Abnormal EKG 09/20/2018  ? Chronic bronchitis ([place]) 10/03/2014  ? COPD (chronic obstructive pulmonary disease) ([place])   ? Dyslipidemia   ? diet controlled  ? Dyspnea   ? with exertion  ? History of radiation therapy 05/28/2020-06/04/2020  ? 3 SBRT treatments to left lung; Dr Gery Pray  ? Lung nodule 02/29/2012  ? 3/24/2014CT Chest: no change in RUL lung nodule, likely benign. No change since 2008  No further CTs needed   ? Lung nodule seen on imaging study   ? Obstructive chronic bronchitis without exacerbation gold stage A COPD 11/17/2007  ? Osteoporosis   ? Pneumonia   ? x 1  ? Primary cancer of left upper lobe of lung ([place]) 05/09/2020  ? 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 Tobacco user   ? quit 06/2012  ? ? ?Past Surgical History:  ?Procedure Laterality Date  ? BRONCHIAL BIOPSY  04/30/2020  ? Procedure: BRONCHIAL BIOPSIES;  Surgeon: Garner Nash, DO;  Location: [facility];  Service: Pulmonary;;  ? BRONCHIAL BRUSHINGS  04/30/2020  ? Procedure: BRONCHIAL BRUSHINGS;  Surgeon: Garner Nash, DO;  Location: [facility];  Service: Pulmonary;;  ? BRONCHIAL NEEDLE ASPIRATION BIOPSY  04/30/2020  ? Procedure: BRONCHIAL NEEDLE ASPIRATION BIOPSIES;  Surgeon: Garner Nash, DO;  Location: [facility];  Service: Pulmonary;;  ? BRONCHIAL WASHINGS  04/30/2020  ? Procedure: BRONCHIAL WASHINGS;  Surgeon: Garner Nash, DO;  Location: [facility];  Service: Pulmonary;;  ? colonoscopy    ? FIDUCIAL MARKER PLACEMENT  04/30/2020  ? Procedure: FIDUCIAL MARKER PLACEMENT;  Surgeon: Garner Nash, DO;  Location: [facility];  Service: Pulmonary;;  ? TONSILLECTOMY    ? TUBAL LIGATION    ? VIDEO BRONCHOSCOPY WITH ENDOBRONCHIAL NAVIGATION N/A 04/30/2020  ? Procedure: VIDEO BRONCHOSCOPY WITH ENDOBRONCHIAL NAVIGATION;  Surgeon: Garner Nash, DO;  Location: [facility];  Service: Pulmonary;  Laterality: N/A;  ? WISDOM TOOTH EXTRACTION    ? WRIST SURGERY Left   ? ? ?Family History  ?Problem Relation Age of Onset  ? Cancer Father   ?     colon  ? Diabetes Sister   ? Hypertension Sister   ? Lymphoma Brother   ? ? ?Social History  ? ?Socioeconomic History  ? Marital status: Married  ?  Spouse name: Not on file  ? Number of children: Not on file  ? Years of education: Not on file  ? Highest education level: Not on file  ?Occupational History  ? Occupation: TEACHER  ?  Employer: [employer]  ?  Comment: [employer]  ?Tobacco Use  ? Smoking status: Former  ?  Packs/day: 0.75  ?  Years: 51.00  ?  Pack years: 38.25  ?  Types: Cigarettes  ?  Quit date: 07/14/2012  ?  Years since quitting: 9.3  ?  Passive exposure: Past  ? Smokeless tobacco: Never  ?Vaping Use  ? Vaping Use: Never used  ?Substance and Sexual Activity  ? Alcohol use: Yes  ?  Comment: occ glass of wine  ? Drug use: No  ? Sexual activity: Not Currently  ?  Birth control/protection: Post-menopausal  ?Other Topics Concern  ? Not on file  ?Social History Narrative  ? Caffeine Use: occasional  ? Regular exercise:  No  ?   ?   ? ?Social Determinants of Health  ? ?Financial Resource Strain: Not on file  ?Food Insecurity: Not on file  ?Transportation Needs: Not on file  ?Physical Activity: Not on file  ?Stress: Not on file  ?Social Connections: Not on file  ?Intimate Partner Violence: Not on file  ? ? ?Outpatient Medications Prior to Visit  ?Medication Sig Dispense Refill  ?  albuterol (PROAIR HFA) 108 (90 Base) MCG/ACT inhaler Inhale 2 puffs into the lungs every 6 (six) hours as needed for shortness of breath. 8 g 3  ? ezetimibe (ZETIA) 10 MG tablet TAKE ONE TABLET BY MOUTH DAILY 90 tablet 3  ? Fluticasone-Umeclidin-Vilant (TRELEGY ELLIPTA) 100-62.5-25 MCG/ACT AEPB Inhale 1 puff into the lungs daily. 14 each 0  ? Tiotropium Bromide Monohydrate (SPIRIVA RESPIMAT) 1.25 MCG/ACT AERS Inhale 2 puffs into the lungs daily. Patient taking medication every other day 12 g 3  ? ?No facility-administered medications prior to visit.  ? ? ?Allergies  ?Allergen Reactions  ? Budesonide-Formoterol Fumarate Cough  ? Statins Other (See Comments)  ?  Trouble urinating  ? ? ?ROS ?See HPI ? ?   ?Objective:  ?  ?Physical Exam ?Constitutional:   ?   General: She is not in acute distress. ?   Appearance: Normal appearance. She is well-developed.  ?HENT:  ?   Head: Normocephalic and atraumatic.  ?   Right Ear: External ear normal.  ?   Left Ear: External ear normal.  ?Eyes:  ?   General: No scleral icterus. ?Neck:  ?   Thyroid: No thyromegaly.  ?Cardiovascular:  ?   Rate and Rhythm: Normal rate and regular rhythm.  ?   Heart sounds: Normal heart sounds. No murmur heard. ?Pulmonary:  ?   Effort: Pulmonary effort is normal. No respiratory distress.  ?   Breath sounds: Normal breath sounds. No wheezing.  ?Musculoskeletal:  ?   Cervical back: Neck supple.  ?Skin: ?   General: Skin is warm and dry.  ?Neurological:  ?   Mental Status: She is alert and oriented to person, place, and time.  ?Psychiatric:     ?   Mood and Affect: Mood normal.     ?   Behavior: Behavior normal.     ?   Thought Content: Thought content normal.     ?   Judgment: Judgment normal.  ? ? ?BP 117/71 (BP Location: Left Arm, Patient Position: Sitting, Cuff Size: Small)   Pulse 65   Temp 97.9 ?F (36.6 ?C) (Oral)   Resp 16   Wt 111 lb 9.6 oz (50.6 kg)   LMP  (LMP Unknown)   SpO2 99%   BMI 19.16 kg/m?  ?Wt Readings from Last 3 Encounters:   ?11/21/21 111 lb 9.6 oz (50.6 kg)  ?11/13/21 111 lb 1.9 oz (50.4 kg)  ?10/23/21 112 lb (50.8 kg)  ? ? ?   ?Assessment & Plan:  ? ?Problem List Items Addressed This Visit   ? ?  ?  Unprioritized  ? Primary cancer of left upper lobe of lung ([place])  ?  S/p radiation treatment. No sign of recurrence on most recent CT scan. ? ?  ?  ? Osteoporosis - Primary  ?  Discussed risks/benefits of Prolia vs Fosamax. Pt would like to proceed with Prolia. Injection given today.  ? ?  ?  ? COPD (chronic obstructive pulmonary disease) ([place])  ?  Reports symptoms have been stable. Continue Trelegy and albuterol.  ? ?  ?  ? ? ?I am having Renee [place]. Navarro maintain her Spiriva Respimat, ezetimibe, albuterol, and Trelegy Ellipta. We administered denosumab. ? ?Meds ordered this encounter  ?Medications  ? denosumab (PROLIA) injection 60 mg  ?  Order Specific Question:   Patient is enrolled in [facility] program for this medication and I have provided a copy of the Prolia Medication Guide and Patient Brochure.  ?  Answer:   No  ?  Order Specific Question:   I have reviewed with the patient the information in the Prolia Medication Guide and Patient Counseling Chart including the serious risks of Prolia and symptoms of each risk.  ?  Answer:   No  ?  Order Specific Question:   I have advised the patient to seek medical attention if they have signs or symptoms of any of the serious risks.  ?  Answer:   No  ? ? ?

## 2021-11-21 NOTE — Assessment & Plan Note (Signed)
Reports symptoms have been stable. Continue Trelegy and albuterol.  ?

## 2021-11-21 NOTE — Telephone Encounter (Signed)
-----   Message from Jiles Prows, [place] sent at 11/21/2021 10:33 AM EDT ----- ?Regarding: prolia ?Hi, patient received first prolia today. Patient advised benefits will be check again when next injection is due. Rod Holler CMA ? ?

## 2021-11-21 NOTE — Assessment & Plan Note (Signed)
S/p radiation treatment. No sign of recurrence on most recent CT scan. ?

## 2021-11-21 NOTE — Telephone Encounter (Signed)
Pt due again after 05/23/22 ?

## 2021-11-27 NOTE — Telephone Encounter (Signed)
Last Prolia inj 11/21/21 ?Next Prolia inj due 05/24/22 ?

## 2021-11-27 NOTE — Telephone Encounter (Signed)
Creft, Darlis Loan, CMA routed conversation to You 6 days ago  ? ?Creft, Darlis Loan, CMA 6 days ago  ? ?Pt due again after 05/23/22  ?  ?  ?Note   ? ?Creft, Darlis Loan, CMA 6 days ago  ? ?----- Message from Jiles Prows, [place] sent at 11/21/2021 10:33 AM EDT ----- ?Regarding: prolia ?Hi, patient received first prolia today. Patient advised benefits will be check again when next injection is due. Rod Holler CMA  ?  ? ?

## 2022-01-03 IMAGING — CT CT CHEST LCS NODULE FOLLOW-UP W/O CM
1 of 2 series · 10 of 20 positions shown, 13 images · non-contrast
Comparison: Follow-up for prior abnormal low-dose lung cancer
screening chest CT 01/08/2020.

CLINICAL DATA: 73-year-old female former smoker (quit within the
last 15 years) with 47 pack-year history of smoking. Lung cancer
screening examination.

EXAM:
CT CHEST WITHOUT CONTRAST FOR LUNG CANCER SCREENING NODULE FOLLOW-UP
TECHNIQUE: Multidetector CT imaging of the chest was performed following the
standard protocol without IV contrast.

[ct lung segmentation data · axial · 0.54mm/px · z∈[-322,-322]mm · 10 of 319 frames shown]
[frame 1/319  mediastinal]
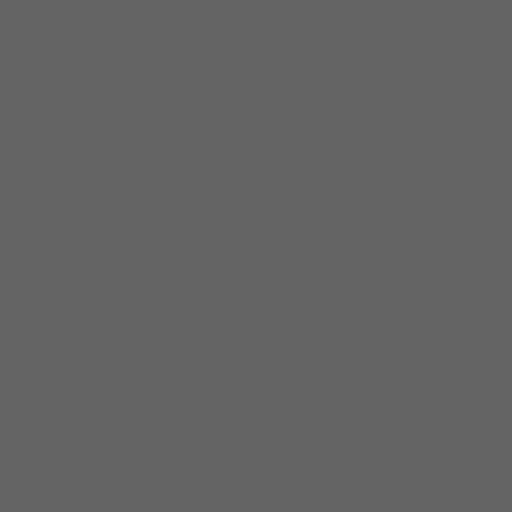
[frame 1/319  lung]
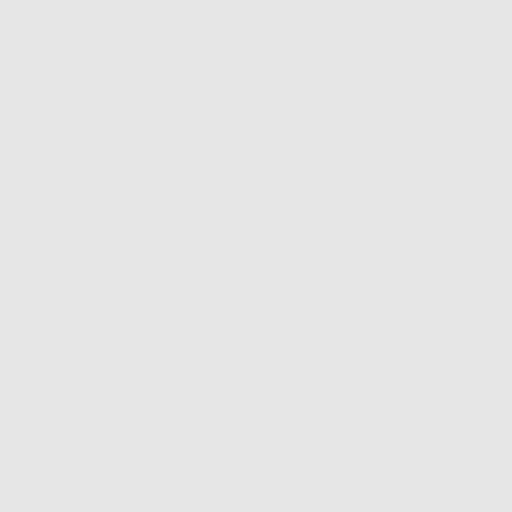
[frame 36/319  lung]
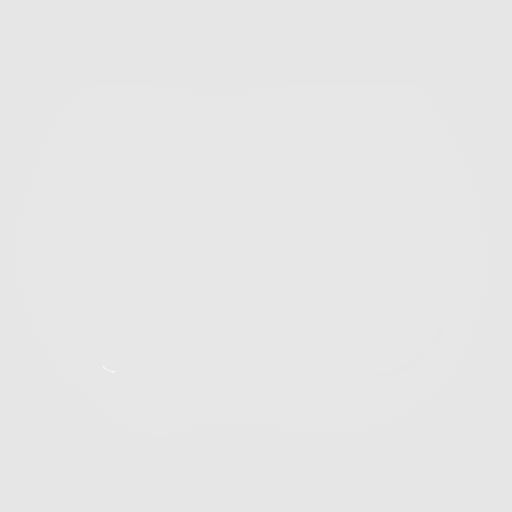
[frame 71/319  lung]
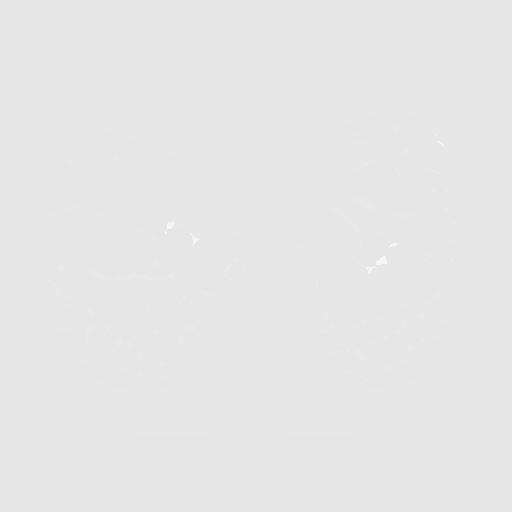
[frame 107/319  lung]
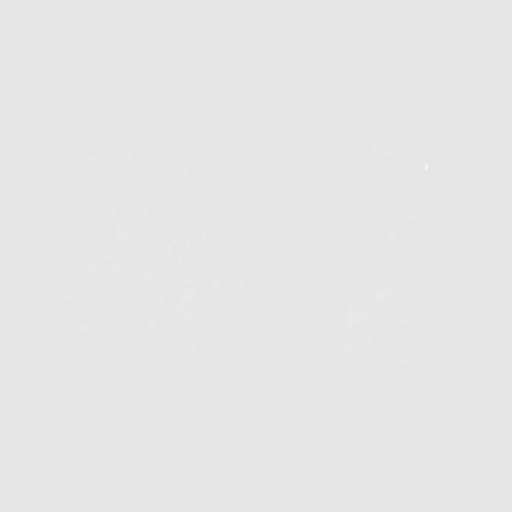
[frame 142/319  mediastinal]
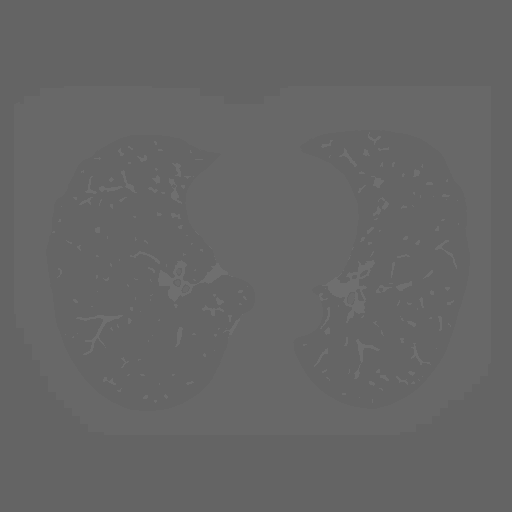
[frame 142/319  lung]
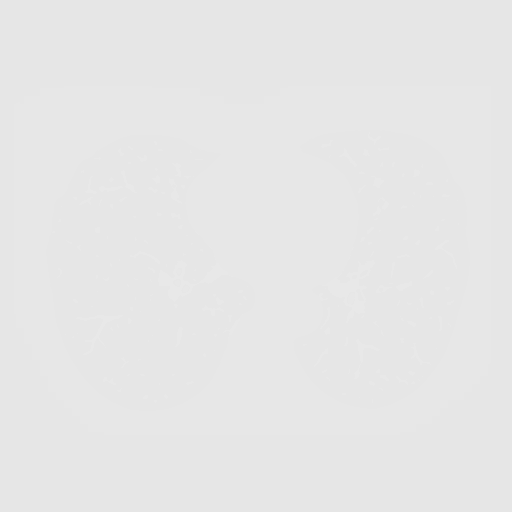
[frame 177/319  lung]
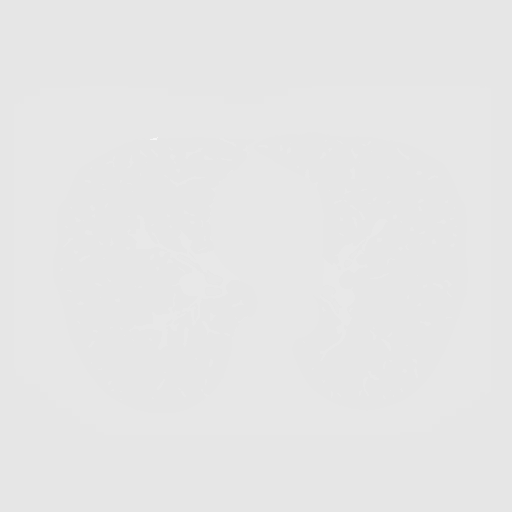
[frame 213/319  lung]
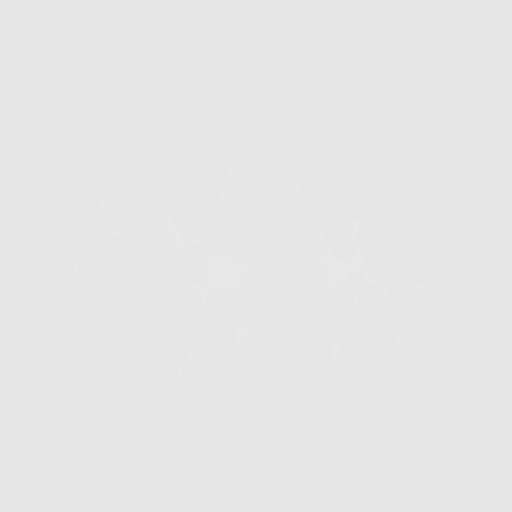
[frame 248/319  lung]
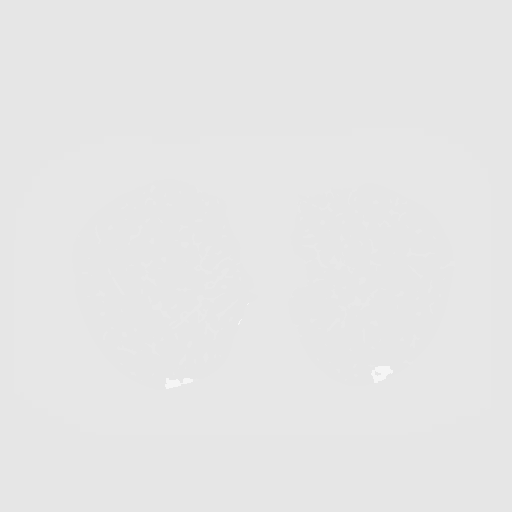
[frame 283/319  mediastinal]
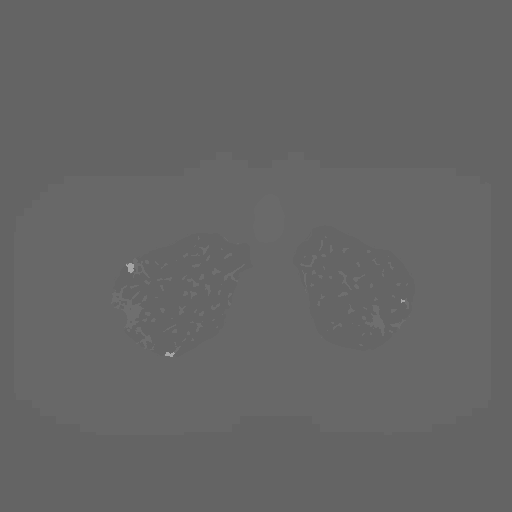
[frame 283/319  lung]
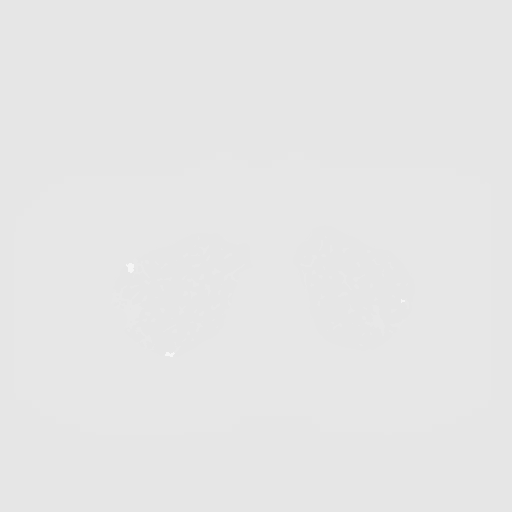
[frame 319/319  lung]
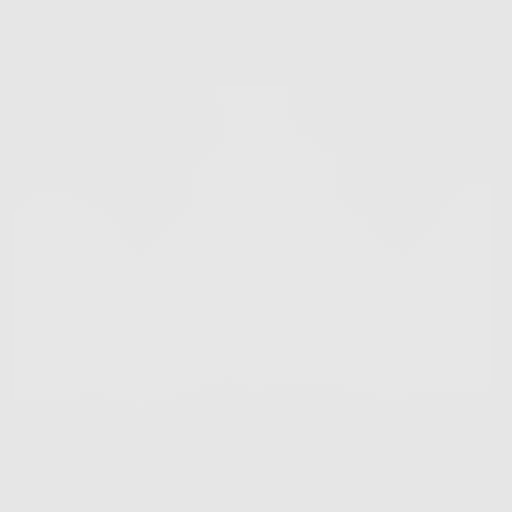

[10 of 20 positions shown; findings below may reference images not displayed]

FINDINGS: Cardiovascular: Heart size is normal. There is no significant
pericardial fluid, thickening or pericardial calcification. There is
aortic atherosclerosis, as well as atherosclerosis of the great
vessels of the mediastinum and the coronary arteries, including
calcified atherosclerotic plaque in the left main, left anterior
descending and right coronary arteries.

Mediastinum/Nodes: No pathologically enlarged mediastinal or hilar
lymph nodes. Please note that accurate exclusion of hilar adenopathy
is limited on noncontrast CT scans. Esophagus is unremarkable in
appearance. No axillary lymphadenopathy.

Lungs/Pleura: The nodule of concern on the prior examination in the
left upper lobe near the apex (axial image 50 of series 3) has
enlarged slightly compared to the prior study, with a volume derived
mean diameter of 9.2 mm on today's examination, with macrolobulated
and slightly spiculated margins, concerning for a small neoplasm.
Several other pulmonary nodules are noted elsewhere in the lungs
bilaterally, generally similar in number and size compared to the
prior study. No acute consolidative airspace disease. No pleural
effusions. Diffuse bronchial wall thickening with moderate
centrilobular and paraseptal emphysema. Extensive bilateral apical
nodular pleuroparenchymal thickening and architectural distortion,
most compatible with areas of chronic post infectious or
inflammatory scarring, similar to prior studies.

Upper Abdomen: Aortic atherosclerosis.

Musculoskeletal: There are no aggressive appearing lytic or blastic
lesions noted in the visualized portions of the skeleton.
IMPRESSION: 1. Previously noted left upper lobe nodule of concern continues to
enlarge and demonstrates aggressive imaging characteristics,
categorized as Lung-RADS 4BS, suspicious. Additional imaging
evaluation or consultation with Pulmonology or Thoracic Surgery
recommended.
2. The "S" modifier above refers to potentially clinically
significant non lung cancer related findings. Specifically, there is
aortic atherosclerosis, in addition to left main and 2 vessel
coronary artery disease. Assessment for potential risk factor
modification, dietary therapy or pharmacologic therapy may be
warranted, if clinically indicated.
3. Diffuse bronchial wall thickening with moderate centrilobular and
paraseptal emphysema; imaging findings suggestive of underlying
COPD.

These results will be called to the ordering clinician or
representative by the Radiologist Assistant, and communication
documented in the PACS or [REDACTED].

Aortic Atherosclerosis ([id number]-[id number].Q) and Emphysema ([id number].7).

## 2022-02-18 DIAGNOSIS — L2089 Other atopic dermatitis: Secondary | ICD-10-CM | POA: Diagnosis not present

## 2022-02-18 DIAGNOSIS — X32XXXS Exposure to sunlight, sequela: Secondary | ICD-10-CM | POA: Diagnosis not present

## 2022-02-18 DIAGNOSIS — D1801 Hemangioma of skin and subcutaneous tissue: Secondary | ICD-10-CM | POA: Diagnosis not present

## 2022-02-18 DIAGNOSIS — L814 Other melanin hyperpigmentation: Secondary | ICD-10-CM | POA: Diagnosis not present

## 2022-02-18 DIAGNOSIS — L821 Other seborrheic keratosis: Secondary | ICD-10-CM | POA: Diagnosis not present

## 2022-02-25 ENCOUNTER — Other Ambulatory Visit (HOSPITAL_BASED_OUTPATIENT_CLINIC_OR_DEPARTMENT_OTHER): Payer: Self-pay | Admitting: Family

## 2022-02-25 DIAGNOSIS — Z1231 Encounter for screening mammogram for malignant neoplasm of breast: Secondary | ICD-10-CM

## 2022-03-04 ENCOUNTER — Telehealth: Payer: Self-pay | Admitting: Family

## 2022-03-04 NOTE — Telephone Encounter (Signed)
The patients husband recently passed away Renee Navarro 07-20-1943). The patient will be getting her husbands records from the last month of his life (OV and hospital notes) and would like to schedule an appointment with her husbands PCP (Dr. Nani Ravens) to discuss/or have PCP explain exactly what did happen to her husband.

## 2022-03-04 NOTE — Telephone Encounter (Signed)
I am not sure how this would work?

## 2022-03-06 ENCOUNTER — Telehealth (HOSPITAL_BASED_OUTPATIENT_CLINIC_OR_DEPARTMENT_OTHER): Payer: Self-pay

## 2022-03-23 ENCOUNTER — Telehealth: Payer: Self-pay | Admitting: Pulmonary Disease

## 2022-03-25 MED ORDER — TRELEGY ELLIPTA 100-62.5-25 MCG/ACT IN AEPB: 1.0000 | INHALATION_SPRAY | Freq: Every day | RESPIRATORY_TRACT | 2 refills | Status: DC

## 2022-03-25 MED ORDER — TRELEGY ELLIPTA 100-62.5-25 MCG/ACT IN AEPB: 1.0000 | INHALATION_SPRAY | Freq: Every day | RESPIRATORY_TRACT | 0 refills | Status: DC

## 2022-03-25 NOTE — Telephone Encounter (Signed)
Called and spoke with patient. Patient stated she was given Trelegy 100 samples from Dr. Valeta Harms to try at [facility].  Patient stated she has used them and felt her breathing was much better after using Trelegy and requested a prescription be sent to [facility].  Patient only wants 1 month at a time instead of 90 supply, because of cost.  Patient also requested Trelegy samples. Trelegy samples placed up front and prescription sent to [facility].  Nothing further at this time.

## 2022-03-25 NOTE — Addendum Note (Signed)
Addended by: Elton Sin on: 03/25/2022 09:54 AM   Modules accepted: Orders

## 2022-04-04 NOTE — Telephone Encounter (Signed)
Prolia VOB initiated via [email or web address] 

## 2022-04-08 ENCOUNTER — Ambulatory Visit: Payer: Medicare Other | Admitting: Family

## 2022-04-15 NOTE — Telephone Encounter (Signed)
Pt ready for scheduling on or after 05/24/22   Out-of-pocket cost due at time of visit: $0   Primary: Medicare Prolia co-insurance: 20% (approximately $276) Admin fee co-insurance: 20% (approximately $25)   Secondary: AARP Medicare Supp Prolia co-insurance: Covers Medicare Part B co-insurance and deductible.  Admin fee co-insurance: Covers Medicare Part B co-insurance and deductible.    Deductible: covered by secondary   Prior Auth: not required PA# Valid:    ** This summary of benefits is an estimation of the patient's out-of-pocket cost. Exact cost may vary based on individual plan coverage.

## 2022-04-20 ENCOUNTER — Telehealth: Payer: Self-pay | Admitting: Pulmonary Disease

## 2022-04-20 ENCOUNTER — Telehealth: Payer: Self-pay | Admitting: Cardiology

## 2022-04-20 MED ORDER — EZETIMIBE 10 MG PO TABS: 10.0000 mg | ORAL_TABLET | Freq: Every day | ORAL | 3 refills | Status: DC

## 2022-04-20 NOTE — Telephone Encounter (Signed)
Medication sent.

## 2022-04-20 NOTE — Telephone Encounter (Signed)
*  STAT* If patient is at the pharmacy, call can be transferred to refill team.   1. Which medications need to be refilled? (please list name of each medication and dose if known) ezetimibe (ZETIA) 10 MG tablet  2. Which pharmacy/location (including street and city if local pharmacy) is medication to be sent to? [facility] ([facility]) - [place], [place]  3. Do they need a 30 day or 90 day supply? [place]

## 2022-04-21 ENCOUNTER — Encounter (HOSPITAL_COMMUNITY): Payer: Self-pay

## 2022-04-21 ENCOUNTER — Telehealth: Payer: Self-pay | Admitting: *Deleted

## 2022-04-21 ENCOUNTER — Other Ambulatory Visit: Payer: Self-pay | Admitting: *Deleted

## 2022-04-21 ENCOUNTER — Ambulatory Visit (HOSPITAL_COMMUNITY)
Admission: RE | Admit: 2022-04-21 | Discharge: 2022-04-21 | Disposition: A | Discharge status: Home / Self Care | Payer: Medicare Other | Source: Ambulatory Visit | Attending: Radiation Oncology | Admitting: Radiation Oncology

## 2022-04-21 DIAGNOSIS — C3412 Malignant neoplasm of upper lobe, left bronchus or lung: Secondary | ICD-10-CM | POA: Diagnosis not present

## 2022-04-21 DIAGNOSIS — Z23 Encounter for immunization: Secondary | ICD-10-CM | POA: Diagnosis not present

## 2022-04-21 DIAGNOSIS — R911 Solitary pulmonary nodule: Secondary | ICD-10-CM | POA: Diagnosis not present

## 2022-04-21 DIAGNOSIS — J439 Emphysema, unspecified: Secondary | ICD-10-CM | POA: Diagnosis not present

## 2022-04-21 DIAGNOSIS — C3490 Malignant neoplasm of unspecified part of unspecified bronchus or lung: Secondary | ICD-10-CM | POA: Diagnosis not present

## 2022-04-21 MED ORDER — TRELEGY ELLIPTA 100-62.5-25 MCG/ACT IN AEPB: 1.0000 | INHALATION_SPRAY | Freq: Every day | RESPIRATORY_TRACT | 0 refills | Status: DC

## 2022-04-21 MED ORDER — TRELEGY ELLIPTA 100-62.5-25 MCG/ACT IN AEPB: 1.0000 | INHALATION_SPRAY | Freq: Every day | RESPIRATORY_TRACT | 3 refills | Status: DC

## 2022-04-21 NOTE — Telephone Encounter (Signed)
Called and spoke with patient.  Patient requested Trelegy 100 samples because she is currently in the doughnut hole. Trelegy samples left at front desk for patient pick up.  Nothing further at this time.

## 2022-04-21 NOTE — Telephone Encounter (Signed)
Returned patient's phone call, spoke with patient 

## 2022-04-22 ENCOUNTER — Ambulatory Visit: Payer: Medicare Other

## 2022-04-22 NOTE — Progress Notes (Signed)
Radiation Oncology         [phone] ________________________________  Name: Renee Navarro MRN: [id number]  Date: 04/23/2022  DOB: [birth year]  Follow-Up Visit Note  CC: Debbrah Alar, NP  Garner Nash, DO  No diagnosis found.  Diagnosis:  Squamous cell carcinoma of the left upper lobe, Clinical Stage IA1  Interval Since Last Radiation: 1 year, 10 months, and 19 days   Radiation Treatment Dates: 05/28/2020 through 06/04/2020 Site Technique Total Dose (Gy) Dose per Fx (Gy) Completed Fx Beam Energies  Lung, Left: Lung_Lt IMRT 54/54 18 3/3 6XFFF    Narrative:  The patient returns today for routine follow-up and to review recent imaging, she was last seen here for follow up on 10/20/21. Since her last visit, the patient followed up with Dr. Valeta Harms on 10/23/21. During which time, the patient reported feeling well and denied any respiratory complaints. She was noted to be using her stiolto inhaler with some difficulty, and endorsed some urinary issues related to the spiriva she had been on in the past. Given so, Dr. Valeta Harms instructed her to stop spiriva and start trelegy.  Her most recent chest CT on 04/21/22 showed stability of the left upper lobe nodule and no evidence of lung cancer recurrence. A stable focus of consolidation was also appreciated in the superior segment of the right lower lobe, and extensive centrilobular emphysema with upper lobe predominance.      Otherwise, no significant interval history since the patient was last seen.   ***                         Allergies:  is allergic to budesonide-formoterol fumarate and statins.  Meds: Current Outpatient Medications  Medication Sig Dispense Refill   albuterol (PROAIR HFA) 108 (90 Base) MCG/ACT inhaler Inhale 2 puffs into the lungs every 6 (six) hours as needed for shortness of breath. 8 g 3   ezetimibe (ZETIA) 10 MG tablet Take 1 tablet (10 mg total) by mouth daily. 90 tablet 3   Fluticasone-Umeclidin-Vilant  (TRELEGY ELLIPTA) 100-62.5-25 MCG/ACT AEPB Inhale 1 puff into the lungs daily. 180 each 3   Fluticasone-Umeclidin-Vilant (TRELEGY ELLIPTA) 100-62.5-25 MCG/ACT AEPB Inhale 1 puff into the lungs daily. 14 each 0   No current facility-administered medications for this encounter.    Physical Findings: The patient is in no acute distress. Patient is alert and oriented.  vitals were not taken for this visit. .  No significant changes. Lungs are clear to auscultation bilaterally. Heart has regular rate and rhythm. No palpable cervical, supraclavicular, or axillary adenopathy. Abdomen soft, non-tender, normal bowel sounds.   Lab Findings: Lab Results  Component Value Date   WBC 6.7 04/18/2020   HGB 15.1 (H) 04/18/2020   HCT 45.3 04/18/2020   MCV 90.0 04/18/2020   PLT 270.0 04/18/2020    Radiographic Findings: CT CHEST WO CONTRAST  Result Date: 04/22/2022 CLINICAL DATA:  Non-small cell lung cancer. Assess treatment response. Post radiation therapy. * Tracking Code: BO * EXAM: CT CHEST WITHOUT CONTRAST TECHNIQUE: Multidetector CT imaging of the chest was performed following the standard protocol without IV contrast. RADIATION DOSE REDUCTION: This exam was performed according to the departmental dose-optimization program which includes automated exposure control, adjustment of the mA and/or kV according to patient size and/or use of iterative reconstruction technique. COMPARISON:  CT 10/16/2021 FINDINGS: Cardiovascular: No significant vascular findings. Normal heart size. No pericardial effusion. Mediastinum/Nodes: No axillary or supraclavicular adenopathy. No mediastinal or hilar  adenopathy. No pericardial fluid. Esophagus normal. Lungs/Pleura: LEFT upper lobe nodule measures 27 x 21 mm (image 27/5) compared to 28 by 22 mm. There is a fiducial marker centrally within this nodule. Visually there is no interval change. There is biapical pleuroparenchymal scarring, unchanged. A focus of mild peripheral  consolidation in the superior segment of the RIGHT lower lobe (image 36/5) is unchanged. No new or suspicious pulmonary nodules. Extensive centrilobular emphysema with upper lobe predominance noted. Upper Abdomen: Limited view of the liver, kidneys, pancreas are unremarkable. Normal adrenal glands. Musculoskeletal: No aggressive osseous lesion. IMPRESSION: 1. Stable LEFT upper lobe pulmonary nodule at radiation treatment site. 2. No evidence of lung cancer recurrence. 3. Stable focus of consolidation in the superior segment of the RIGHT lower lobe. 4. Extensive centrilobular emphysema with upper lobe predominance (ICD10-J43.9). Electronically Signed   By: Suzy Bouchard M.D.   On: 04/22/2022 09:26    Impression: Squamous cell carcinoma of the left upper lobe, Clinical Stage IA1  The patient is recovering from the effects of radiation.  ***  Plan:  ***   *** minutes of total time was spent for this patient encounter, including preparation, face-to-face counseling with the patient and coordination of care, physical exam, and documentation of the encounter. ____________________________________  Blair Promise, PhD, MD  This document serves as a record of services personally performed by Gery Pray, MD. It was created on his behalf by Roney Mans, a trained medical scribe. The creation of this record is based on the scribe's personal observations and the provider's statements to them. This document has been checked and approved by the attending provider.

## 2022-04-23 ENCOUNTER — Ambulatory Visit
Admission: RE | Admit: 2022-04-23 | Discharge: 2022-04-23 | Disposition: A | Discharge status: Home / Self Care | Payer: Medicare Other | Source: Ambulatory Visit | Attending: Radiation Oncology | Admitting: Radiation Oncology

## 2022-04-23 ENCOUNTER — Encounter: Payer: Self-pay | Admitting: Radiation Oncology

## 2022-04-23 VITALS — BP 150/75 | HR 64 | Temp 97.0°F | Resp 17 | Wt 112.4 lb

## 2022-04-23 DIAGNOSIS — Z85118 Personal history of other malignant neoplasm of bronchus and lung: Secondary | ICD-10-CM | POA: Diagnosis not present

## 2022-04-23 DIAGNOSIS — C3412 Malignant neoplasm of upper lobe, left bronchus or lung: Secondary | ICD-10-CM | POA: Diagnosis not present

## 2022-04-23 DIAGNOSIS — J432 Centrilobular emphysema: Secondary | ICD-10-CM | POA: Diagnosis not present

## 2022-04-23 DIAGNOSIS — Z923 Personal history of irradiation: Secondary | ICD-10-CM | POA: Insufficient documentation

## 2022-04-23 DIAGNOSIS — Z87891 Personal history of nicotine dependence: Secondary | ICD-10-CM | POA: Diagnosis not present

## 2022-04-23 NOTE — Progress Notes (Signed)
Renee Navarro is here today for follow up post radiation to the lung.  Lung Side: Left,patient completed treatment on 06/04/20  Does the patient complain of any of the following: Pain:No Shortness of breath w/wo exertion: Yes, patient has COPD.  Cough: No Hemoptysis: No Pain with swallowing: No Swallowing/choking concerns: No Appetite: Good Energy Level: good Post radiation skin Changes: No    Additional comments if applicable:   BP (!) 131/43 (BP Location: Left Arm, Patient Position: Sitting)   Pulse 64   Temp (!) 97 F (36.1 C) (Oral)   Resp 17   Wt 112 lb 6 oz (51 kg)   LMP  (LMP Unknown)   SpO2 98%   BMI 19.29 kg/m

## 2022-04-27 ENCOUNTER — Ambulatory Visit: Payer: Self-pay | Admitting: Radiation Oncology

## 2022-04-28 DIAGNOSIS — H40023 Open angle with borderline findings, high risk, bilateral: Secondary | ICD-10-CM | POA: Diagnosis not present

## 2022-04-28 DIAGNOSIS — H25813 Combined forms of age-related cataract, bilateral: Secondary | ICD-10-CM | POA: Diagnosis not present

## 2022-04-29 ENCOUNTER — Ambulatory Visit (HOSPITAL_BASED_OUTPATIENT_CLINIC_OR_DEPARTMENT_OTHER)
Admission: RE | Admit: 2022-04-29 | Discharge: 2022-04-29 | Disposition: A | Discharge status: Home / Self Care | Payer: Medicare Other | Source: Ambulatory Visit | Attending: Family | Admitting: Family

## 2022-04-29 ENCOUNTER — Encounter (HOSPITAL_BASED_OUTPATIENT_CLINIC_OR_DEPARTMENT_OTHER): Payer: Self-pay

## 2022-04-29 DIAGNOSIS — Z1231 Encounter for screening mammogram for malignant neoplasm of breast: Secondary | ICD-10-CM | POA: Diagnosis not present

## 2022-04-30 ENCOUNTER — Telehealth: Payer: Self-pay | Admitting: Cardiology

## 2022-04-30 DIAGNOSIS — Z23 Encounter for immunization: Secondary | ICD-10-CM | POA: Diagnosis not present

## 2022-04-30 NOTE — Telephone Encounter (Signed)
Advised to stop the medication and let Dr. Bettina Gavia know next week how she is feeling. Pt verbalized understanding and had no additional questions.

## 2022-04-30 NOTE — Telephone Encounter (Signed)
Pt c/o medication issue:  1. Name of Medication: ezetimibe (ZETIA) 10 MG tablet  2. How are you currently taking this medication (dosage and times per day)? 1 tablet daily  3. Are you having a reaction (difficulty breathing--STAT)? no  4. What is your medication issue? Patient states she has been getting lower back pains and now also leg pains. She says she think it is from the medication and would like to know if she can stop it.

## 2022-05-22 ENCOUNTER — Ambulatory Visit: Payer: Medicare Other | Admitting: Family

## 2022-05-27 ENCOUNTER — Ambulatory Visit: Payer: Medicare Other | Admitting: Family

## 2022-05-27 ENCOUNTER — Encounter: Payer: Self-pay | Admitting: Family

## 2022-05-27 ENCOUNTER — Ambulatory Visit (INDEPENDENT_AMBULATORY_CARE_PROVIDER_SITE_OTHER): Payer: Medicare Other | Admitting: Family

## 2022-05-27 ENCOUNTER — Ambulatory Visit: Payer: Medicare Other

## 2022-05-27 VITALS — BP 122/72 | HR 60 | Temp 98.1°F | Resp 16 | Wt 113.0 lb

## 2022-05-27 DIAGNOSIS — F4321 Adjustment disorder with depressed mood: Secondary | ICD-10-CM | POA: Diagnosis not present

## 2022-05-27 DIAGNOSIS — I2510 Atherosclerotic heart disease of native coronary artery without angina pectoris: Secondary | ICD-10-CM

## 2022-05-27 DIAGNOSIS — M810 Age-related osteoporosis without current pathological fracture: Secondary | ICD-10-CM

## 2022-05-27 DIAGNOSIS — J449 Chronic obstructive pulmonary disease, unspecified: Secondary | ICD-10-CM

## 2022-05-27 DIAGNOSIS — I2584 Coronary atherosclerosis due to calcified coronary lesion: Secondary | ICD-10-CM

## 2022-05-27 DIAGNOSIS — E785 Hyperlipidemia, unspecified: Secondary | ICD-10-CM | POA: Diagnosis not present

## 2022-05-27 DIAGNOSIS — F4320 Adjustment disorder, unspecified: Secondary | ICD-10-CM | POA: Insufficient documentation

## 2022-05-27 LAB — COMPREHENSIVE METABOLIC PANEL
ALT: 23 U/L (ref 0–35)
AST: 22 U/L (ref 0–37)
Albumin: 4.7 g/dL (ref 3.5–5.2)
Alkaline Phosphatase: 42 U/L (ref 39–117)
BUN: 14 mg/dL (ref 6–23)
CO2: 30 mEq/L (ref 19–32)
Calcium: 9.1 mg/dL (ref 8.4–10.5)
Chloride: 101 mEq/L (ref 96–112)
Creatinine, Ser: 0.66 mg/dL (ref 0.40–1.20)
GFR: 86.72 mL/min (ref >60.00)
Glucose, Bld: 92 mg/dL (ref 70–99)
Potassium: 4 mEq/L (ref 3.5–5.1)
Sodium: 137 mEq/L (ref 135–145)
Total Bilirubin: 0.4 mg/dL (ref 0.2–1.2)
Total Protein: 7.4 g/dL (ref 6.0–8.3)

## 2022-05-27 LAB — LIPID PANEL
Cholesterol: 239 mg/dL — ABNORMAL HIGH (ref 0–200)
HDL: 79.3 mg/dL (ref >39.00)
LDL Cholesterol: 127 mg/dL — ABNORMAL HIGH (ref 0–99)
NonHDL: 159.65
Total CHOL/HDL Ratio: 3
Triglycerides: 161 mg/dL — ABNORMAL HIGH (ref 0.0–149.0)
VLDL: 32.2 mg/dL (ref 0.0–40.0)

## 2022-05-27 MED ORDER — DENOSUMAB 60 MG/ML ~~LOC~~ SOSY
60.0000 mg | PREFILLED_SYRINGE | Freq: Once | SUBCUTANEOUS | Status: AC
  Administered 2022-05-27: 60 mg via SUBCUTANEOUS

## 2022-05-27 NOTE — Assessment & Plan Note (Signed)
Stable on Trelegy and prn albuterol.

## 2022-05-27 NOTE — Assessment & Plan Note (Signed)
Stopped zetia for a month because she was concerned that it was causing her back pain to worsen. No improvement in back pain when she was off of it. Restarted 1 week ago.

## 2022-05-27 NOTE — Assessment & Plan Note (Signed)
Prolia injection today

## 2022-05-27 NOTE — Assessment & Plan Note (Signed)
Support provided.  She has her children nearby which is also comforting for her.

## 2022-05-27 NOTE — Progress Notes (Signed)
Subjective:     Patient ID: Renee Navarro, female    DOB: 1946-06-01, 76 y.o.   MRN: [id number]  Chief Complaint  Patient presents with   Osteoporosis    On Prolia shots    HPI Patient is in today for follow up.   Husband died in 02/08/23 from [place]. This was very unexpected and she continues to grieve this loss. Husband used to come with her to her appointments but she gets very nervous with driving long distances and wishes to see a PCP closer to home.   She states that she had rsv vaccine, flu shot and covid booster at her pharmacy.  Osteoporosis- prolia injection today.   COPD- stable with trelegy and prn proair. She remains tobacco free.  Hx of lung Cancer- hx of radiation therapy- followed by pulmonology and had a CT chest last month which did not show signs of recurrence.    Health Maintenance Due  Topic Date Due   Hepatitis C Screening  Never done   Medicare Annual Wellness (AWV)  09/10/2019   TETANUS/TDAP  02/24/2021   COVID-19 Vaccine (6 - Pfizer risk series) 05/22/2022    Past Medical History:  Diagnosis Date   Abnormal EKG 09/20/2018   Chronic bronchitis ([place]) 10/03/2014   COPD (chronic obstructive pulmonary disease) (HCC)    Dyslipidemia    diet controlled   Dyspnea    with exertion   History of radiation therapy 05/28/2020-06/04/2020   3 SBRT treatments to left lung; Dr Gery Pray   Lung nodule 02/29/2012   3/24/2014CT Chest: no change in RUL lung nodule, likely benign. No change since 2008  No further CTs needed    Lung nodule seen on imaging study    Obstructive chronic bronchitis without exacerbation gold stage A COPD 11/17/2007   Osteoporosis    Pneumonia    x 1   Pneumonia    x 1   Primary cancer of left upper lobe of lung ([place]) 05/09/2020   RBBB (right bundle branch block with left anterior fascicular block) 09/22/2018   Tobacco user    quit 06/2012    Past Surgical History:  Procedure Laterality Date   BRONCHIAL BIOPSY  04/30/2020    Procedure: BRONCHIAL BIOPSIES;  Surgeon: Garner Nash, DO;  Location: [facility];  Service: Pulmonary;;   BRONCHIAL BRUSHINGS  04/30/2020   Procedure: BRONCHIAL BRUSHINGS;  Surgeon: Garner Nash, DO;  Location: [facility];  Service: Pulmonary;;   BRONCHIAL NEEDLE ASPIRATION BIOPSY  04/30/2020   Procedure: BRONCHIAL NEEDLE ASPIRATION BIOPSIES;  Surgeon: Garner Nash, DO;  Location: [facility];  Service: Pulmonary;;   BRONCHIAL WASHINGS  04/30/2020   Procedure: BRONCHIAL WASHINGS;  Surgeon: Garner Nash, DO;  Location: [facility];  Service: Pulmonary;;   colonoscopy     FIDUCIAL MARKER PLACEMENT  04/30/2020   Procedure: FIDUCIAL MARKER PLACEMENT;  Surgeon: Garner Nash, DO;  Location: [facility];  Service: Pulmonary;;   TONSILLECTOMY     TUBAL LIGATION     VIDEO BRONCHOSCOPY WITH ENDOBRONCHIAL NAVIGATION N/A 04/30/2020   Procedure: VIDEO BRONCHOSCOPY WITH ENDOBRONCHIAL NAVIGATION;  Surgeon: Garner Nash, DO;  Location: [facility];  Service: Pulmonary;  Laterality: N/A;   WISDOM TOOTH EXTRACTION     WRIST SURGERY Left     Family History  Problem Relation Age of Onset   Cancer Father        colon   Diabetes Sister    Hypertension Sister    Lymphoma Brother  Social History   Socioeconomic History   Marital status: Married    Spouse name: Not on file   Number of children: Not on file   Years of education: Not on file   Highest education level: Not on file  Occupational History   Occupation: TEACHER    Employer: [employer]    Comment: [employer]  Tobacco Use   Smoking status: Former    Packs/day: 0.75    Years: 51.00    Total pack years: 38.25    Types: Cigarettes    Quit date: 07/14/2012    Years since quitting: 9.8    Passive exposure: Past   Smokeless tobacco: Never  Vaping Use   Vaping Use: Never used  Substance and Sexual Activity   Alcohol use: Yes    Comment: occ glass of wine   Drug use: No   Sexual activity: Not  Currently    Birth control/protection: Post-menopausal  Other Topics Concern   Not on file  Social History Narrative   Caffeine Use: occasional   Regular exercise:  No         Social Determinants of Radio broadcast assistant Strain: Not on file  Food Insecurity: Not on file  Transportation Needs: Not on file  Physical Activity: Not on file  Stress: Not on file  Social Connections: Not on file  Intimate Partner Violence: Not on file    Outpatient Medications Prior to Visit  Medication Sig Dispense Refill   albuterol (PROAIR HFA) 108 (90 Base) MCG/ACT inhaler Inhale 2 puffs into the lungs every 6 (six) hours as needed for shortness of breath. 8 g 3   ezetimibe (ZETIA) 10 MG tablet Take 1 tablet (10 mg total) by mouth daily. 90 tablet 3   Fluticasone-Umeclidin-Vilant (TRELEGY ELLIPTA) 100-62.5-25 MCG/ACT AEPB Inhale 1 puff into the lungs daily. 180 each 3   Fluticasone-Umeclidin-Vilant (TRELEGY ELLIPTA) 100-62.5-25 MCG/ACT AEPB Inhale 1 puff into the lungs daily. 14 each 0   No facility-administered medications prior to visit.    Allergies  Allergen Reactions   Budesonide-Formoterol Fumarate Cough   Statins Other (See Comments)    Trouble urinating    ROS    See HPI Objective:    Physical Exam Constitutional:      General: She is not in acute distress.    Appearance: Normal appearance. She is well-developed.  HENT:     Head: Normocephalic and atraumatic.     Right Ear: External ear normal.     Left Ear: External ear normal.  Eyes:     General: No scleral icterus. Neck:     Thyroid: No thyromegaly.  Cardiovascular:     Rate and Rhythm: Normal rate and regular rhythm.     Heart sounds: Normal heart sounds. No murmur heard. Pulmonary:     Effort: Pulmonary effort is normal. No respiratory distress.     Breath sounds: No wheezing.     Comments: Decreased breath sounds throughout Musculoskeletal:     Cervical back: Neck supple.  Skin:    General: Skin is  warm and dry.  Neurological:     Mental Status: She is alert and oriented to person, place, and time.  Psychiatric:        Mood and Affect: Mood is anxious. Affect is tearful.        Behavior: Behavior normal.        Thought Content: Thought content normal.        Judgment: Judgment normal.  BP 122/72 (BP Location: Left Arm, Patient Position: Sitting, Cuff Size: Small)   Pulse 60   Temp 98.1 F (36.7 C) (Oral)   Resp 16   Wt 113 lb (51.3 kg)   LMP  (LMP Unknown)   SpO2 98%   BMI 19.40 kg/m  Wt Readings from Last 3 Encounters:  05/27/22 113 lb (51.3 kg)  04/23/22 112 lb 6 oz (51 kg)  11/21/21 111 lb 9.6 oz (50.6 kg)       Assessment & Plan:   Problem List Items Addressed This Visit       Unprioritized   Osteoporosis - Primary    Prolia injection today.        Hyperlipidemia    Stopped zetia for a month because she was concerned that it was causing her back pain to worsen. No improvement in back pain when she was off of it. Restarted 1 week ago.       Relevant Orders   Comp Met (CMET)   Lipid panel   Grief reaction    Support provided.  She has her children nearby which is also comforting for her.       COPD (chronic obstructive pulmonary disease) (HCC)    Stable on Trelegy and prn albuterol.        I am having Chandani Rogowski. Caterino maintain her albuterol, ezetimibe, Trelegy Ellipta, and Trelegy Ellipta. We administered denosumab.  Meds ordered this encounter  Medications   denosumab (PROLIA) injection 60 mg    Order Specific Question:   Patient is enrolled in REMS program for this medication and I have provided a copy of the Prolia Medication Guide and Patient Brochure.    Answer:   No    Order Specific Question:   I have reviewed with the patient the information in the Prolia Medication Guide and Patient Counseling Chart including the serious risks of Prolia and symptoms of each risk.    Answer:   No    Order Specific Question:   I have advised the patient  to seek medical attention if they have signs or symptoms of any of the serious risks.    Answer:   No

## 2022-05-28 ENCOUNTER — Encounter: Payer: Self-pay | Admitting: Family

## 2022-05-28 NOTE — Progress Notes (Signed)
Mailed out to patient 

## 2022-06-01 ENCOUNTER — Telehealth: Payer: Self-pay | Admitting: Family

## 2022-06-01 ENCOUNTER — Telehealth: Payer: Self-pay | Admitting: Pulmonary Disease

## 2022-06-01 NOTE — Telephone Encounter (Signed)
Pt called to follow up on lab results from last week. Advised pt that, due to [place] being out the back half of last week, she has not had a chance to go over them yet and should be shortly. Pt asked if a reminder note could be sent back for her to go over those when possible and advised one would be sent back.

## 2022-06-01 NOTE — Telephone Encounter (Signed)
Called and told patient her samples will be upfront for her. Nothing further needed

## 2022-06-02 NOTE — Telephone Encounter (Signed)
Patient advised results were mailed, she reports she has not receive. Results reviewed with patient on the phone and mailed to her again after confirming address.

## 2022-06-04 DIAGNOSIS — J449 Chronic obstructive pulmonary disease, unspecified: Secondary | ICD-10-CM | POA: Diagnosis not present

## 2022-06-04 DIAGNOSIS — E7800 Pure hypercholesterolemia, unspecified: Secondary | ICD-10-CM | POA: Diagnosis not present

## 2022-06-04 DIAGNOSIS — Z0000 Encounter for general adult medical examination without abnormal findings: Secondary | ICD-10-CM | POA: Diagnosis not present

## 2022-06-04 DIAGNOSIS — Z85118 Personal history of other malignant neoplasm of bronchus and lung: Secondary | ICD-10-CM | POA: Diagnosis not present

## 2022-06-04 DIAGNOSIS — M810 Age-related osteoporosis without current pathological fracture: Secondary | ICD-10-CM | POA: Diagnosis not present

## 2022-06-15 NOTE — Telephone Encounter (Signed)
Last Prolia inj 05/27/22 Next Prolia inj due 11/26/22  Forwarding to Rx prior auth team.

## 2022-06-22 DIAGNOSIS — H40023 Open angle with borderline findings, high risk, bilateral: Secondary | ICD-10-CM | POA: Diagnosis not present

## 2022-06-30 ENCOUNTER — Telehealth: Payer: Self-pay | Admitting: Pulmonary Disease

## 2022-06-30 MED ORDER — TRELEGY ELLIPTA 100-62.5-25 MCG/ACT IN AEPB: 1.0000 | INHALATION_SPRAY | Freq: Every day | RESPIRATORY_TRACT | 0 refills | Status: DC

## 2022-06-30 NOTE — Telephone Encounter (Signed)
Called and spoke with patient. Patient stated that she's on trelegy 100 but her PCP gave her trelegy 200. Patient wants to know if this is okay for her to take.   BI, please advise.

## 2022-06-30 NOTE — Telephone Encounter (Signed)
Called and spoke with patient. She verbalized understanding. Patient asked if she could have 1 sample from [place] as well. I advised her I would leave a sample at the front desk for her. Patient stated she would come get it today.   Nothing further needed.

## 2022-07-15 DIAGNOSIS — J209 Acute bronchitis, unspecified: Secondary | ICD-10-CM | POA: Diagnosis not present

## 2022-08-03 ENCOUNTER — Telehealth: Payer: Self-pay | Admitting: Pulmonary Disease

## 2022-08-03 NOTE — Telephone Encounter (Signed)
Please contact [facility] and request they fill her rx since she has tolerated it without any difficulties. Her listed reaction to Symbicort is a cough. It's not an allergy and safe to refill Trelegy. Thanks.

## 2022-08-03 NOTE — Telephone Encounter (Signed)
Spoke with pt who states [facility] will not fill request for Trelegy because allergy listed in chart to Budesonide-formoterol Fumarate which is listed in Epic. Pt states she has been using Trelegy for last 6 months without any problems. Upon review of chart during last OV in March 2023 pt was to continue [place] but was given Trelegy samples. Pt was in donut hole and using samples of Trelegy provided by office until the start of the year. Pt is currently out of Trelegy samples. Pt is requesting that we order Trelegy through [facility]. Joellen Jersey can you please advise on situation, as Dr. Valeta Harms is unavailable today?

## 2022-08-03 NOTE — Telephone Encounter (Signed)
Spoke with [facility] and reviewed [name]'s request. [facility] pharmacist stated they will fill Trelegy and send it out. Pt updated. Nothing further needed at this time.

## 2022-08-21 ENCOUNTER — Telehealth: Payer: Self-pay | Admitting: Cardiology

## 2022-08-21 NOTE — Telephone Encounter (Signed)
Called and spoke with patient. She denies lightheadedness, describes everything as more of an anxiousness feeling. States the first time it occurred a week ago, she was sitting at home and only lasted a minute or so. The repeat episode was day before yesterday, same sensations, and lasted "just a few seconds." She denies vision changes, feeling like she would pass out, chest tightness, chest pain, or any worsening of symptoms. Advised that this all sounds like anxiety and does not sound heart related. Educated on deep breathing and to call PCP if episodes increase in frequency or severity. She will call us back if she feels any other cardiac symptoms.

## 2022-08-21 NOTE — Telephone Encounter (Signed)
Pt states she noticed last weak that she was lightheaded, SOB, and her neck pulse was racing. She states that she waited a few minutes and it went away. She states it happened again while driving recently. Pt states she is unsure if this is a cardiac issue or if she should f/u with her PCP. Please advise.

## 2022-09-04 ENCOUNTER — Telehealth (HOSPITAL_BASED_OUTPATIENT_CLINIC_OR_DEPARTMENT_OTHER): Payer: Self-pay | Admitting: *Deleted

## 2022-09-04 DIAGNOSIS — I479 Paroxysmal tachycardia, unspecified: Secondary | ICD-10-CM | POA: Diagnosis not present

## 2022-09-04 DIAGNOSIS — J449 Chronic obstructive pulmonary disease, unspecified: Secondary | ICD-10-CM | POA: Diagnosis not present

## 2022-09-04 NOTE — Telephone Encounter (Signed)
Left message for patient to call and discuss sooner appointment with Dr. Harrell Gave per referral from Caren Macadam, MD

## 2022-09-04 NOTE — Telephone Encounter (Signed)
Pt. Returning your call.

## 2022-09-04 NOTE — Telephone Encounter (Signed)
Pt is returning call. Requesting return call.

## 2022-09-08 ENCOUNTER — Encounter (HOSPITAL_BASED_OUTPATIENT_CLINIC_OR_DEPARTMENT_OTHER): Payer: Self-pay | Admitting: Cardiology

## 2022-09-08 ENCOUNTER — Ambulatory Visit (INDEPENDENT_AMBULATORY_CARE_PROVIDER_SITE_OTHER): Payer: Medicare Other | Admitting: Cardiology

## 2022-09-08 VITALS — BP 100/70 | HR 62 | Ht 64.0 in | Wt 110.0 lb

## 2022-09-08 DIAGNOSIS — I2510 Atherosclerotic heart disease of native coronary artery without angina pectoris: Secondary | ICD-10-CM | POA: Diagnosis not present

## 2022-09-08 DIAGNOSIS — E7800 Pure hypercholesterolemia, unspecified: Secondary | ICD-10-CM | POA: Diagnosis not present

## 2022-09-08 DIAGNOSIS — Z7189 Other specified counseling: Secondary | ICD-10-CM

## 2022-09-08 DIAGNOSIS — I2584 Coronary atherosclerosis due to calcified coronary lesion: Secondary | ICD-10-CM

## 2022-09-08 DIAGNOSIS — I700 Atherosclerosis of aorta: Secondary | ICD-10-CM

## 2022-09-08 DIAGNOSIS — R002 Palpitations: Secondary | ICD-10-CM | POA: Diagnosis not present

## 2022-09-08 NOTE — Progress Notes (Signed)
Cardiology Office Note:    Date:  09/08/2022   ID:  Renee Navarro, DOB 11/17/1945, MRN [id number]  PCP:  Caren Macadam, MD  Cardiologist:  Buford Dresser, MD  Referring MD: Debbrah Alar, NP   CC: new patient to me/establish care  History of Present Illness:    LEDIA Navarro is a 77 y.o. female with a hx of coronary artery calcification, aortic atherosclerosis, hypercholesterolemia, prior lung cancer who is seen as a new patient to me/transfer of care. She was last seen by Dr. Bettina Gavia on 11/13/21.  Most recent note from Dr. Mannie Stabile dated 09/04/22 reviewed. Noted episode on 07/30/22 of palpitations/tachycardia, referred urgently back to cardiology for further evaluation.  Today: She had an episode recently when she was standing in her kitchen, her heart was racing and she was short of breath. It stopped on its own and lasted about two minutes. No presyncope or syncope. Nothing different about that day. Had a long time ago but this was with high caffeine coffee; she now only drinks decaf. No other symptoms. Declines ECG today as she just had one at her PCP visit (not included with the referral notes).  Tolerates zetia without issues. Did not tolerate the statins in the past.   No limitations, walks a few blocks around her neighborhood every day. Does get winded with hills but this is chronic.  Denies chest pain, shortness of breath at rest or with normal exertion. No PND, orthopnea, Renee edema or unexpected weight gain. No syncope or palpitations.   Past Medical History:  Diagnosis Date   Abnormal EKG 09/20/2018   Chronic bronchitis ([place]) 10/03/2014   COPD (chronic obstructive pulmonary disease) (HCC)    Dyslipidemia    diet controlled   Dyspnea    with exertion   History of radiation therapy 05/28/2020-06/04/2020   3 SBRT treatments to left lung; Dr Gery Pray   Lung nodule 02/29/2012   3/24/2014CT Chest: no change in RUL lung nodule, likely benign. 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 block) 09/22/2018   Tobacco user    quit 06/2012    Past Surgical History:  Procedure Laterality Date   BRONCHIAL BIOPSY  04/30/2020   Procedure: BRONCHIAL BIOPSIES;  Surgeon: Garner Nash, DO;  Location: [facility];  Service: Pulmonary;;   BRONCHIAL BRUSHINGS  04/30/2020   Procedure: BRONCHIAL BRUSHINGS;  Surgeon: Garner Nash, DO;  Location: [facility];  Service: Pulmonary;;   BRONCHIAL NEEDLE ASPIRATION BIOPSY  04/30/2020   Procedure: BRONCHIAL NEEDLE ASPIRATION BIOPSIES;  Surgeon: Garner Nash, DO;  Location: [facility];  Service: Pulmonary;;   BRONCHIAL WASHINGS  04/30/2020   Procedure: BRONCHIAL WASHINGS;  Surgeon: Garner Nash, DO;  Location: [facility];  Service: Pulmonary;;   colonoscopy     FIDUCIAL MARKER PLACEMENT  04/30/2020   Procedure: FIDUCIAL MARKER PLACEMENT;  Surgeon: Garner Nash, DO;  Location: [facility];  Service: Pulmonary;;   TONSILLECTOMY     TUBAL LIGATION     VIDEO BRONCHOSCOPY WITH ENDOBRONCHIAL NAVIGATION N/A 04/30/2020   Procedure: VIDEO BRONCHOSCOPY WITH ENDOBRONCHIAL NAVIGATION;  Surgeon: Garner Nash,  DO;  Location: [facility];  Service: Pulmonary;  Laterality: N/A;   WISDOM TOOTH EXTRACTION     WRIST SURGERY Left     Current Medications: Current Outpatient Medications on File Prior to Visit  Medication Sig   albuterol (PROAIR HFA) 108 (90 Base) MCG/ACT inhaler Inhale 2 puffs into the lungs every 6 (six) hours as needed for shortness of breath.   denosumab (PROLIA) 60 MG/ML SOSY injection Inject 60 mg into the skin every 6 (six) months.   ezetimibe (ZETIA) 10 MG tablet Take 1 tablet (10 mg total) by  mouth daily.   Fluticasone-Umeclidin-Vilant (TRELEGY ELLIPTA) 100-62.5-25 MCG/ACT AEPB Inhale 1 puff into the lungs daily.   No current facility-administered medications on file prior to visit.     Allergies:   Budesonide-formoterol fumarate and Statins   Social History   Tobacco Use   Smoking status: Former    Packs/day: 0.75    Years: 51.00    Total pack years: 38.25    Types: Cigarettes    Quit date: 07/14/2012    Years since quitting: 10.1    Passive exposure: Past   Smokeless tobacco: Never  Vaping Use   Vaping Use: Never used  Substance Use Topics   Alcohol use: Yes    Comment: occ glass of wine   Drug use: No    Family History: family history includes Cancer in her father; Diabetes in her sister; Hypertension in her sister; Lymphoma in her brother.  ROS:   Please see the history of present illness.  Additional pertinent ROS otherwise unremarkable.   EKGs/Labs/Other Studies Reviewed:    The following studies were reviewed today: No prior echo, cath, stress test, monitor  EKG:  EKG is personally reviewed.   09/08/22: patient declined ECG today  Recent Labs: 05/27/2022: ALT 23; BUN 14; Creatinine, Ser 0.66; Potassium 4.0; Sodium 137  Recent Lipid Panel    Component Value Date/Time   CHOL 239 (H) 05/27/2022 1035   CHOL 218 (H) 11/13/2021 0956   TRIG 161.0 (H) 05/27/2022 1035   HDL 79.30 05/27/2022 1035   HDL 82 11/13/2021 0956   CHOLHDL 3 05/27/2022 1035   VLDL 32.2 05/27/2022 1035   LDLCALC 127 (H) 05/27/2022 1035   LDLCALC 111 (H) 11/13/2021 0956   LDLDIRECT 198.1 11/19/2008 1026    Physical Exam:    VS:  BP 100/70 (BP Location: Right Arm, Patient Position: Sitting, Cuff Size: Normal)   Pulse 62   Ht 5\' 4"  (1.626 m)   Wt 110 lb (49.9 kg)   LMP  (LMP Unknown)   SpO2 98%   BMI 18.88 kg/m     Wt Readings from Last 3 Encounters:  09/08/22 110 lb (49.9 kg)  05/27/22 113 lb (51.3 kg)  04/23/22 112 lb 6 oz (51 kg)    GEN: Well nourished, well  developed in no acute distress HEENT: Normal, moist mucous membranes NECK: No JVD CARDIAC: regular rhythm, normal S1 and S2, no rubs or gallops. No murmur. VASCULAR: Radial and DP pulses 2+ bilaterally. No carotid bruits RESPIRATORY:  Clear to auscultation without rales, wheezing or rhonchi  ABDOMEN: Soft, non-tender, non-distended MUSCULOSKELETAL:  Ambulates independently SKIN: Warm and dry, no edema NEUROLOGIC:  Alert and oriented x 3. No focal neuro deficits noted. PSYCHIATRIC:  Normal affect    ASSESSMENT:    1. Heart palpitations   2. Coronary artery calcification   3. Aortic atherosclerosis ([place])   4. Pure hypercholesterolemia   5. Counseling on health promotion and disease  prevention    PLAN:    Palpitations -we discussed the potential causes of fast heart rates and palpitations today. Reviewed the normal electrical system of the heart. Reviewed the role of the sinus node. Reviewed the balance between resting (vagal) tone and fight or flight nervous system input. Reviewed how exercise improves vagal tone and lowers resting heart rate. Reviewed that sinus tachycardia, or elevated sinus rate, is usually secondary to something else in the body. This can include pain, stress, infection, anxiety, hormone imbalance, low blood counts, etc. Discussed that we do not typically treat sinus tachycardia by itself, and instead the focus is on finding what is driving the heart rate and treating that. We discussed that there can be other rhythm issues, from either the top or bottom of the heart, that are abnormal rhythms. Discussed how we evaluate for these.  -discussed both monitor and Kardiamobile today. After shared decision making, will hold on this for now but she will contact us if this recurs  Calcification of coronary arteries Hypercholesterolemia -did not tolerate statins in the past -now on zetia -reviewed red flag warning signs that need immediate medical attention  Cardiac risk  counseling and prevention recommendations: -recommend heart healthy/Mediterranean diet, with whole grains, fruits, vegetable, fish, lean meats, nuts, and olive oil. Limit salt. -recommend moderate walking, 3-5 times/week for 30-50 minutes each session. Aim for at least 150 minutes.week. Goal should be pace of 3 miles/hours, or walking 1.5 miles in 30 minutes -recommend avoidance of tobacco products. Avoid excess alcohol. -ASCVD risk score: The 10-year ASCVD risk score (Arnett DK, et al., 2019) is: 13.7%   Values used to calculate the score:     Age: 71 years     Sex: Female     Is Non-Hispanic African American: No     Diabetic: No     Tobacco smoker: Yes     Systolic Blood Pressure: 034 mmHg     Is BP treated: No     HDL Cholesterol: 79.3 mg/dL     Total Cholesterol: 239 mg/dL    Plan for follow up: 1 year or sooner as needed  Buford Dresser, MD, PhD, [facility] [address] [place], [place] [address] [phone]   Medication Adjustments/Labs and Tests Ordered: Current medicines are reviewed at length with the patient today.  Concerns regarding medicines are outlined above.  No orders of the defined types were placed in this encounter.  No orders of the defined types were placed in this encounter.   Patient Instructions  Medication Instructions:  The current medical regimen is effective;  continue present plan and medications.   *If you need a refill on your cardiac medications before your next appointment, please call your pharmacy*   Lab Work: None I Testing/Procedures: None   Follow-Up: At [facility], you and your health needs are our priority.  As part of our continuing mission to provide you with exceptional heart care, we have created designated Provider Care Teams.  These Care Teams include your primary Cardiologist  (physician) and Advanced Practice Providers (APPs -  Physician Assistants and Nurse Practitioners) who all work together to provide you with the care you need, when you need it.  We recommend signing up for the patient portal called "MyChart".  Sign up information is provided on this After Visit Summary.  MyChart is used to connect with patients for Virtual  Visits (Telemedicine).  Patients are able to view lab/test results, encounter notes, upcoming appointments, etc.  Non-urgent messages can be sent to your provider as well.   To learn more about what you can do with MyChart, go to [email or web address].    Your next appointment:   1 year(s)  Provider:   Dr. Buford Dresser  Other Instructions None   Signed, Buford Dresser, MD PhD 09/08/2022 9:42 AM    [place]

## 2022-09-08 NOTE — Patient Instructions (Signed)
Medication Instructions:  The current medical regimen is effective;  continue present plan and medications.   *If you need a refill on your cardiac medications before your next appointment, please call your pharmacy*   Lab Work: None I Testing/Procedures: None   Follow-Up: At [facility], you and your health needs are our priority.  As part of our continuing mission to provide you with exceptional heart care, we have created designated Provider Care Teams.  These Care Teams include your primary Cardiologist (physician) and Advanced Practice Providers (APPs -  Physician Assistants and Nurse Practitioners) who all work together to provide you with the care you need, when you need it.  We recommend signing up for the patient portal called "MyChart".  Sign up information is provided on this After Visit Summary.  MyChart is used to connect with patients for Virtual Visits (Telemedicine).  Patients are able to view lab/test results, encounter notes, upcoming appointments, etc.  Non-urgent messages can be sent to your provider as well.   To learn more about what you can do with MyChart, go to [email or web address].    Your next appointment:   1 year(s)  Provider:   Dr. Buford Dresser  Other Instructions None

## 2022-09-23 ENCOUNTER — Encounter (HOSPITAL_BASED_OUTPATIENT_CLINIC_OR_DEPARTMENT_OTHER): Payer: Self-pay

## 2022-09-30 ENCOUNTER — Telehealth: Payer: Self-pay | Admitting: *Deleted

## 2022-09-30 NOTE — Telephone Encounter (Signed)
RETURNED PATIENT'S PHONE CALL, SPOKE WITH PATIENT. ?

## 2022-10-19 NOTE — Progress Notes (Signed)
Renee Navarro is here today for follow up post radiation to the lung.  Lung Side: Left, patient completed treatment on 06/04/20.  Does the patient complain of any of the following: Pain:*** Shortness of breath w/wo exertion: *** Cough: *** Hemoptysis: *** Pain with swallowing: *** Swallowing/choking concerns: *** Appetite: *** Energy Level: *** Post radiation skin Changes: ***    Additional comments if applicable:

## 2022-10-21 ENCOUNTER — Encounter (HOSPITAL_COMMUNITY): Payer: Self-pay

## 2022-10-21 ENCOUNTER — Ambulatory Visit (HOSPITAL_COMMUNITY)
Admission: RE | Admit: 2022-10-21 | Discharge: 2022-10-21 | Disposition: A | Discharge status: Home / Self Care | Payer: Medicare Other | Source: Ambulatory Visit | Attending: Radiation Oncology | Admitting: Radiation Oncology

## 2022-10-21 DIAGNOSIS — J439 Emphysema, unspecified: Secondary | ICD-10-CM | POA: Diagnosis not present

## 2022-10-21 DIAGNOSIS — C3490 Malignant neoplasm of unspecified part of unspecified bronchus or lung: Secondary | ICD-10-CM | POA: Diagnosis not present

## 2022-10-21 DIAGNOSIS — C3412 Malignant neoplasm of upper lobe, left bronchus or lung: Secondary | ICD-10-CM | POA: Insufficient documentation

## 2022-10-21 NOTE — Progress Notes (Signed)
  Radiation Oncology         [phone] ________________________________  Name: Renee Navarro MRN: [id number]  Date: 10/22/2022  DOB: 03/25/1946  Follow-Up Visit Note  CC: Caren Macadam, MD  Garner Nash, DO  No diagnosis found.  Diagnosis: Squamous cell carcinoma of the left upper lobe, Clinical Stage IA1   Interval Since Last Radiation: 2 years, 4 months, and 19 days   Radiation Treatment Dates: 05/28/2020 through 06/04/2020 Site Technique Total Dose (Gy) Dose per Fx (Gy) Completed Fx Beam Energies  Lung, Left: Lung_Lt SBRT 54/54 18 3/3 6XFFF   Narrative:  The patient returns today for routine follow-up and to review recent imaging. She was last seen here for follow up on 04/23/22. Her most recent chest CT without contrast performed yesterday (10/21/22) shows: ***.   No other significant oncologic interval history since the patient was last seen for follow-up.   Of note: the patient presented for a routine bilateral screening mammogram on 04/29/22 which showed no evidence of malignancy in either breast.       ***                         Allergies:  is allergic to budesonide-formoterol fumarate and statins.  Meds: Current Outpatient Medications  Medication Sig Dispense Refill   albuterol (PROAIR HFA) 108 (90 Base) MCG/ACT inhaler Inhale 2 puffs into the lungs every 6 (six) hours as needed for shortness of breath. 8 g 3   denosumab (PROLIA) 60 MG/ML SOSY injection Inject 60 mg into the skin every 6 (six) months.     ezetimibe (ZETIA) 10 MG tablet Take 1 tablet (10 mg total) by mouth daily. 90 tablet 3   Fluticasone-Umeclidin-Vilant (TRELEGY ELLIPTA) 100-62.5-25 MCG/ACT AEPB Inhale 1 puff into the lungs daily. 60 each 0   No current facility-administered medications for this encounter.    Physical Findings: The patient is in no acute distress. Patient is alert and oriented.  vitals were not taken for this visit. .  No significant changes. Lungs are clear to auscultation  bilaterally. Heart has regular rate and rhythm. No palpable cervical, supraclavicular, or axillary adenopathy. Abdomen soft, non-tender, normal bowel sounds.   Lab Findings: Lab Results  Component Value Date   WBC 6.7 04/18/2020   HGB 15.1 (H) 04/18/2020   HCT 45.3 04/18/2020   MCV 90.0 04/18/2020   PLT 270.0 04/18/2020    Radiographic Findings: No results found.  Impression:  Squamous cell carcinoma of the left upper lobe, Clinical Stage IA1   The patient is recovering from the effects of radiation.  ***  Plan:  ***   *** minutes of total time was spent for this patient encounter, including preparation, face-to-face counseling with the patient and coordination of care, physical exam, and documentation of the encounter. ____________________________________  Blair Promise, PhD, MD  This document serves as a record of services personally performed by Gery Pray, MD. It was created on his behalf by Roney Mans, a trained medical scribe. The creation of this record is based on the scribe's personal observations and the provider's statements to them. This document has been checked and approved by the attending provider.

## 2022-10-22 ENCOUNTER — Ambulatory Visit (HOSPITAL_COMMUNITY): Payer: Medicare Other

## 2022-10-22 ENCOUNTER — Ambulatory Visit
Admission: RE | Admit: 2022-10-22 | Discharge: 2022-10-22 | Disposition: A | Discharge status: Home / Self Care | Payer: Medicare Other | Source: Ambulatory Visit | Attending: Radiation Oncology | Admitting: Radiation Oncology

## 2022-10-22 VITALS — BP 122/95 | HR 67 | Temp 97.0°F | Resp 18 | Ht 64.0 in | Wt 114.2 lb

## 2022-10-22 DIAGNOSIS — Z79899 Other long term (current) drug therapy: Secondary | ICD-10-CM | POA: Diagnosis not present

## 2022-10-22 DIAGNOSIS — Z85118 Personal history of other malignant neoplasm of bronchus and lung: Secondary | ICD-10-CM | POA: Insufficient documentation

## 2022-10-22 DIAGNOSIS — Z87891 Personal history of nicotine dependence: Secondary | ICD-10-CM | POA: Diagnosis not present

## 2022-10-22 DIAGNOSIS — C3412 Malignant neoplasm of upper lobe, left bronchus or lung: Secondary | ICD-10-CM

## 2022-10-22 DIAGNOSIS — J432 Centrilobular emphysema: Secondary | ICD-10-CM | POA: Insufficient documentation

## 2022-10-22 DIAGNOSIS — Z923 Personal history of irradiation: Secondary | ICD-10-CM | POA: Diagnosis not present

## 2022-10-26 ENCOUNTER — Ambulatory Visit: Payer: Self-pay | Admitting: Radiation Oncology

## 2022-10-30 NOTE — Telephone Encounter (Signed)
Prolia VOB initiated via [email or web address] 

## 2022-11-02 NOTE — Telephone Encounter (Signed)
Patient is now seeing a new provider and she will be getting at their office.

## 2022-11-02 NOTE — Telephone Encounter (Signed)
Pt ready for scheduling for Prolia on or after : 11/26/22  Out-of-pocket cost due at time of visit: $0  Primary: [place] [employer] Prolia co-insurance: 0% Admin fee co-insurance: 0%  Secondary: [employer] Prolia co-insurance:  Admin fee co-insurance:   Medical Benefit Details: Date Benefits were checked: 04/04/22 Deductible: $226 met of $226 required/ Coinsurance: 0%/ Admin Fee: 0%  Prior Auth: N/A PA# Expiration Date:    Pharmacy benefit: Copay $--- If patient wants fill through the pharmacy benefit please send prescription to:  --- , and include estimated need by date in rx notes. Pharmacy will ship medication directly to the office.  Patient NOT eligible for Prolia Copay Card. Copay Card can make patient's cost as little as $25. Link to apply: [email or web address]://[email or web address]  ** This summary of benefits is an estimation of the patient's out-of-pocket cost. Exact cost may very based on individual plan coverage.

## 2022-11-03 DIAGNOSIS — E7800 Pure hypercholesterolemia, unspecified: Secondary | ICD-10-CM | POA: Diagnosis not present

## 2022-11-03 DIAGNOSIS — M810 Age-related osteoporosis without current pathological fracture: Secondary | ICD-10-CM | POA: Diagnosis not present

## 2022-11-03 DIAGNOSIS — Z85118 Personal history of other malignant neoplasm of bronchus and lung: Secondary | ICD-10-CM | POA: Diagnosis not present

## 2022-11-03 DIAGNOSIS — I479 Paroxysmal tachycardia, unspecified: Secondary | ICD-10-CM | POA: Diagnosis not present

## 2022-11-03 DIAGNOSIS — R7989 Other specified abnormal findings of blood chemistry: Secondary | ICD-10-CM | POA: Diagnosis not present

## 2022-11-05 ENCOUNTER — Encounter (HOSPITAL_BASED_OUTPATIENT_CLINIC_OR_DEPARTMENT_OTHER): Payer: Self-pay | Admitting: Family Medicine

## 2022-11-05 ENCOUNTER — Other Ambulatory Visit (HOSPITAL_BASED_OUTPATIENT_CLINIC_OR_DEPARTMENT_OTHER): Payer: Self-pay | Admitting: Family Medicine

## 2022-11-05 DIAGNOSIS — M810 Age-related osteoporosis without current pathological fracture: Secondary | ICD-10-CM

## 2022-11-09 ENCOUNTER — Ambulatory Visit: Payer: Medicare Other | Admitting: Pulmonary Disease

## 2022-11-09 ENCOUNTER — Encounter: Payer: Self-pay | Admitting: Pulmonary Disease

## 2022-11-09 ENCOUNTER — Ambulatory Visit (INDEPENDENT_AMBULATORY_CARE_PROVIDER_SITE_OTHER): Payer: Medicare Other | Admitting: Pulmonary Disease

## 2022-11-09 VITALS — BP 120/70 | HR 87 | Ht 64.0 in | Wt 112.6 lb

## 2022-11-09 DIAGNOSIS — C3412 Malignant neoplasm of upper lobe, left bronchus or lung: Secondary | ICD-10-CM

## 2022-11-09 DIAGNOSIS — J432 Centrilobular emphysema: Secondary | ICD-10-CM

## 2022-11-09 DIAGNOSIS — Z87891 Personal history of nicotine dependence: Secondary | ICD-10-CM | POA: Diagnosis not present

## 2022-11-09 DIAGNOSIS — J449 Chronic obstructive pulmonary disease, unspecified: Secondary | ICD-10-CM | POA: Diagnosis not present

## 2022-11-09 NOTE — Progress Notes (Signed)
Synopsis: Referred in Oct 2021 for lung nodule by Aliene Beams, MD  Subjective:   PATIENT ID: Renee Navarro GENDER: female DOB: [birth year], MRN: [id number]  Chief Complaint  Patient presents with   Follow-up    F/up    77 yo FM, PMH COPD, former smoker, quit 7 years ago, enrolled in LDCT screening. New LUL nodule that was no present in comparison to previous images. Recent CT chest with LUL nodule 1.2 cm in largest cross section. She was referred to see me in consultation for videobronchoscopy, navigation and fiducial placement. She has severe COPD Stage 3 based on FEV1 50% predicted. She does not want to have lobectomy. I discussed with her at length about the options of surgery vs radiation therapy. She has opted to pursue radiation therapy if that is an option but needing to proceed with bx first. From a copd standpoint she occasionally uses albuterol and that's it. She is not on any maintence inhalers.   OV 10/23/2020: Here today for follow-up.  Last seen in office via televisit.  Since patient was last seen she underwent video bronchoscopy with tissue sampling of left upper lobe nodule confirming non-small cell lung cancer.  Patient underwent 3 SBRT treatments to the left lung by Dr. Roselind Messier radiation oncology. Doing well. DOE is still present. She is using her inhalers regularly, Spiriva plus as needed albuterol.  She does not need refills today.  She has them sent to mail in pharmacy.  OV 10/23/2021: Here today for follow up.  Doing well.  No complaints today.  Still using Stiolto.  She does have some trouble using the Stiolto inhaler.  She feels like she has had some urinary issues related to the Spiriva she has been on in the past.  She is been using it every other day.  OV 11/09/2022: Here today for follow-up.  She is been doing really well after switch to Trelegy.  Trelegy does cost a lot so benign supply of samples during her donut hole..  She had recent CT scan of the chest in March  which showed no evidence of recurrence of disease.  From respiratory standpoint she can breathe fine has no other issues.    Past Medical History:  Diagnosis Date   Abnormal EKG 09/20/2018   Chronic bronchitis 10/03/2014   COPD (chronic obstructive pulmonary disease)    Dyslipidemia    diet controlled   Dyspnea    with exertion   History of radiation therapy 05/28/2020-06/04/2020   3 SBRT treatments to left lung; Dr Antony Blackbird   Lung nodule 02/29/2012   3/24/2014CT Chest: no change in RUL lung nodule, likely benign. No change since 2008  No further CTs needed    Lung nodule seen on imaging study    Obstructive chronic bronchitis without exacerbation gold stage A COPD 11/17/2007   Osteoporosis    Pneumonia    x 1   Pneumonia    x 1   Primary cancer of left upper lobe of lung 05/09/2020   RBBB (right bundle branch block with left anterior fascicular block) 09/22/2018   Tobacco user    quit 06/2012     Family History  Problem Relation Age of Onset   Cancer Father        colon   Diabetes Sister    Hypertension Sister    Lymphoma Brother      Past Surgical History:  Procedure Laterality Date   BRONCHIAL BIOPSY  04/30/2020   Procedure: BRONCHIAL BIOPSIES;  Surgeon: Josephine Igo, DO;  Location: [facility];  Service: Pulmonary;;   BRONCHIAL BRUSHINGS  04/30/2020   Procedure: BRONCHIAL BRUSHINGS;  Surgeon: Josephine Igo, DO;  Location: [facility];  Service: Pulmonary;;   BRONCHIAL NEEDLE ASPIRATION BIOPSY  04/30/2020   Procedure: BRONCHIAL NEEDLE ASPIRATION BIOPSIES;  Surgeon: Josephine Igo, DO;  Location: [facility];  Service: Pulmonary;;   BRONCHIAL WASHINGS  04/30/2020   Procedure: BRONCHIAL WASHINGS;  Surgeon: Josephine Igo, DO;  Location: [facility];  Service: Pulmonary;;   colonoscopy     FIDUCIAL MARKER PLACEMENT  04/30/2020   Procedure: FIDUCIAL MARKER PLACEMENT;  Surgeon: Josephine Igo, DO;  Location: [facility];  Service: Pulmonary;;    TONSILLECTOMY     TUBAL LIGATION     VIDEO BRONCHOSCOPY WITH ENDOBRONCHIAL NAVIGATION N/A 04/30/2020   Procedure: VIDEO BRONCHOSCOPY WITH ENDOBRONCHIAL NAVIGATION;  Surgeon: Josephine Igo, DO;  Location: [facility];  Service: Pulmonary;  Laterality: N/A;   WISDOM TOOTH EXTRACTION     WRIST SURGERY Left     Social History   Socioeconomic History   Marital status: Married    Spouse name: Not on file   Number of children: Not on file   Years of education: Not on file   Highest education level: Not on file  Occupational History   Occupation: TEACHER    Employer: [employer]    Comment: [employer]  Tobacco Use   Smoking status: Former    Packs/day: 0.75    Years: 51.00    Additional pack years: 0.00    Total pack years: 38.25    Types: Cigarettes    Quit date: 07/14/2012    Years since quitting: 10.3    Passive exposure: Past   Smokeless tobacco: Never  Vaping Use   Vaping Use: Never used  Substance and Sexual Activity   Alcohol use: Yes    Comment: occ glass of wine   Drug use: No   Sexual activity: Not Currently    Birth control/protection: Post-menopausal  Other Topics Concern   Not on file  Social History Narrative   Caffeine Use: occasional   Regular exercise:  No         Social Determinants of Corporate investment banker Strain: Not on file  Food Insecurity: Not on file  Transportation Needs: Not on file  Physical Activity: Not on file  Stress: Not on file  Social Connections: Not on file  Intimate Partner Violence: Not on file     Allergies  Allergen Reactions   Budesonide-Formoterol Fumarate Cough   Statins Other (See Comments)    Trouble urinating     Outpatient Medications Prior to Visit  Medication Sig Dispense Refill   albuterol (PROAIR HFA) 108 (90 Base) MCG/ACT inhaler Inhale 2 puffs into the lungs every 6 (six) hours as needed for shortness of breath. 8 g 3   denosumab (PROLIA) 60 MG/ML SOSY injection Inject 60 mg into the skin  every 6 (six) months.     ezetimibe (ZETIA) 10 MG tablet Take 1 tablet (10 mg total) by mouth daily. 90 tablet 3   Fluticasone-Umeclidin-Vilant (TRELEGY ELLIPTA) 100-62.5-25 MCG/ACT AEPB Inhale 1 puff into the lungs daily. 60 each 0   No facility-administered medications prior to visit.    Review of Systems  Constitutional:  Negative for chills, fever, malaise/fatigue and weight loss.  HENT:  Negative for hearing loss, sore throat and tinnitus.   Eyes:  Negative for blurred vision and double  vision.  Respiratory:  Positive for shortness of breath. Negative for cough, hemoptysis, sputum production, wheezing and stridor.   Cardiovascular:  Negative for chest pain, palpitations, orthopnea, leg swelling and PND.  Gastrointestinal:  Negative for abdominal pain, constipation, diarrhea, heartburn, nausea and vomiting.  Genitourinary:  Negative for dysuria, hematuria and urgency.  Musculoskeletal:  Negative for joint pain and myalgias.  Skin:  Negative for itching and rash.  Neurological:  Negative for dizziness, tingling, weakness and headaches.  Endo/Heme/Allergies:  Negative for environmental allergies. Does not bruise/bleed easily.  Psychiatric/Behavioral:  Negative for depression. The patient is not nervous/anxious and does not have insomnia.   All other systems reviewed and are negative.    Objective:  Physical Exam Vitals reviewed.  Constitutional:      General: She is not in acute distress.    Appearance: She is well-developed.  HENT:     Head: Normocephalic and atraumatic.     Mouth/Throat:     Pharynx: No oropharyngeal exudate.  Eyes:     Conjunctiva/sclera: Conjunctivae normal.     Pupils: Pupils are equal, round, and reactive to light.  Neck:     Vascular: No JVD.     Trachea: No tracheal deviation.     Comments: Loss of supraclavicular fat Cardiovascular:     Rate and Rhythm: Normal rate and regular rhythm.     Heart sounds: S1 normal and S2 normal.     Comments:  Distant heart tones Pulmonary:     Effort: No tachypnea or accessory muscle usage.     Breath sounds: No stridor. Decreased breath sounds (throughout all lung fields) present. No wheezing, rhonchi or rales.     Comments: Diminished breath sounds bilaterally Abdominal:     General: Bowel sounds are normal. There is no distension.     Palpations: Abdomen is soft.     Tenderness: There is no abdominal tenderness.  Musculoskeletal:        General: Deformity (muscle wasting ) present.  Skin:    General: Skin is warm and dry.     Capillary Refill: Capillary refill takes less than 2 seconds.     Findings: No rash.  Neurological:     Mental Status: She is alert and oriented to person, place, and time.  Psychiatric:        Behavior: Behavior normal.      Vitals:   11/09/22 0835  BP: 120/70  Pulse: 87  SpO2: 98%  Weight: 112 lb 9.6 oz (51.1 kg)  Height: 5\' 4"  (1.626 m)   98% on RA BMI Readings from Last 3 Encounters:  11/09/22 19.33 kg/m  10/22/22 19.61 kg/m  09/08/22 18.88 kg/m   Wt Readings from Last 3 Encounters:  11/09/22 112 lb 9.6 oz (51.1 kg)  10/22/22 114 lb 4 oz (51.8 kg)  09/08/22 110 lb (49.9 kg)     CBC    Component Value Date/Time   WBC 6.7 04/18/2020 1451   RBC 5.04 04/18/2020 1451   HGB 15.1 (H) 04/18/2020 1451   HCT 45.3 04/18/2020 1451   PLT 270.0 04/18/2020 1451   MCV 90.0 04/18/2020 1451   MCHC 33.3 04/18/2020 1451   RDW 14.2 04/18/2020 1451   LYMPHSABS 1.5 11/19/2008 1026   MONOABS 0.4 11/19/2008 1026   EOSABS 0.1 11/19/2008 1026   BASOSABS 0.0 11/19/2008 1026     Chest Imaging: CT Chest 04/24/2020  CT chest March 2024: Stable postradiation changes.  No evidence of recurrence of disease. The patient's images have been  independently reviewed by me.    Pulmonary Functions Testing Results:    Latest Ref Rng & Units 04/18/2020    1:15 PM  PFT Results  FVC-Pre L 1.74  P  FVC-Predicted Pre % 58  P  FVC-Post L 1.77  P  FVC-Predicted  Post % 59  P  Pre FEV1/FVC % % 64  P  Post FEV1/FCV % % 64  P  FEV1-Pre L 1.12  P  FEV1-Predicted Pre % 50  P  FEV1-Post L 1.12  P  DLCO uncorrected ml/min/mmHg 13.35  P  DLCO UNC% % 68  P  DLCO corrected ml/min/mmHg 13.35  P  DLCO COR %Predicted % 68  P  DLVA Predicted % 95  P    P Preliminary result    FeNO:   Pathology:   October 2021 Bronchoscopy: Squamous cell carcinoma   Echocardiogram:   Heart Catheterization:     Assessment & Plan:     ICD-10-CM   1. Squamous cell carcinoma of bronchus in left upper lobe  C34.12     2. Stage 2 moderate COPD by GOLD classification  J44.9     3. Centrilobular emphysema  J43.2     4. Former smoker  Z87.891       Discussion:  This is a 77 year old female, previously enlarging left upper lobe pulmonary nodule concerning for primary bronchogenic carcinoma status post navigational bronchoscopy tissue sampling confirmed squamous cell carcinoma, non-small cell lung cancer.  Underwent SBRT treatments.  Follow-up CT imaging with stable postradiation changes she has stage II COPD on Trelegy.  Plan: Continue Trelegy Continue supply of samples if needed to help with cost when she follows in the donut hole. Continue albuterol as needed. Follow-up with [place] in 1 year or as needed. Continue CT surveillance with radiation oncology as scheduled in September 2024.  RTC 1 year.    Current Outpatient Medications:    albuterol (PROAIR HFA) 108 (90 Base) MCG/ACT inhaler, Inhale 2 puffs into the lungs every 6 (six) hours as needed for shortness of breath., Disp: 8 g, Rfl: 3   denosumab (PROLIA) 60 MG/ML SOSY injection, Inject 60 mg into the skin every 6 (six) months., Disp: , Rfl:    ezetimibe (ZETIA) 10 MG tablet, Take 1 tablet (10 mg total) by mouth daily., Disp: 90 tablet, Rfl: 3   Fluticasone-Umeclidin-Vilant (TRELEGY ELLIPTA) 100-62.5-25 MCG/ACT AEPB, Inhale 1 puff into the lungs daily., Disp: 60 each, Rfl: 0   Josephine Igo,  DO [facility] 11/09/2022 9:20 AM

## 2022-11-09 NOTE — Patient Instructions (Signed)
Thank you for visiting Dr. Tonia Brooms at [facility]. Today we recommend the following:  Continue Trelegy  Follow up ct imaging in September with Dr. Roselind Messier   Return in about 1 year (around 11/09/2023) for with Kandice Robinsons, NP, or Dr. Tonia Brooms.    Please do your part to reduce the spread of COVID-19.

## 2022-11-10 ENCOUNTER — Other Ambulatory Visit: Payer: Self-pay | Admitting: Family Medicine

## 2022-11-10 DIAGNOSIS — Z1231 Encounter for screening mammogram for malignant neoplasm of breast: Secondary | ICD-10-CM

## 2022-11-10 DIAGNOSIS — M810 Age-related osteoporosis without current pathological fracture: Secondary | ICD-10-CM

## 2022-11-12 ENCOUNTER — Ambulatory Visit: Payer: Medicare Other | Admitting: Cardiology

## 2022-11-20 DIAGNOSIS — M25512 Pain in left shoulder: Secondary | ICD-10-CM | POA: Diagnosis not present

## 2022-11-25 ENCOUNTER — Ambulatory Visit (HOSPITAL_BASED_OUTPATIENT_CLINIC_OR_DEPARTMENT_OTHER): Payer: Medicare Other | Admitting: Cardiology

## 2022-11-25 DIAGNOSIS — R7989 Other specified abnormal findings of blood chemistry: Secondary | ICD-10-CM | POA: Diagnosis not present

## 2022-11-25 DIAGNOSIS — M810 Age-related osteoporosis without current pathological fracture: Secondary | ICD-10-CM | POA: Diagnosis not present

## 2022-11-30 DIAGNOSIS — M810 Age-related osteoporosis without current pathological fracture: Secondary | ICD-10-CM | POA: Diagnosis not present

## 2022-12-08 ENCOUNTER — Telehealth: Payer: Self-pay | Admitting: *Deleted

## 2022-12-08 NOTE — Telephone Encounter (Signed)
CALLED PATIENT TO INFORM OF CT FOR 04-23-23- ARRIVAL TIME- 1 PM @ [facility], NO RESTRICTIONS TO TEST, PATIENT TO RECEIVE RESULTS FROM DR. KINARD ON 04-26-23 @ 10 AM, SPOKE WITH PATIENT AND SHE IS AWARE OF THESE APPTS. AND THE INSTRUCTIONS

## 2023-01-05 DIAGNOSIS — M899 Disorder of bone, unspecified: Secondary | ICD-10-CM | POA: Diagnosis not present

## 2023-01-25 ENCOUNTER — Telehealth: Payer: Self-pay | Admitting: Pulmonary Disease

## 2023-01-25 ENCOUNTER — Telehealth (HOSPITAL_BASED_OUTPATIENT_CLINIC_OR_DEPARTMENT_OTHER): Payer: Self-pay | Admitting: Cardiology

## 2023-01-25 MED ORDER — EZETIMIBE 10 MG PO TABS: 10.0000 mg | ORAL_TABLET | Freq: Every day | ORAL | 3 refills | Status: DC

## 2023-01-25 NOTE — Telephone Encounter (Signed)
Rx request sent to pharmacy.  

## 2023-01-25 NOTE — Telephone Encounter (Signed)
Patient states would like samples of Trelegy. States is in the donut hole for the rest of the year. Patient phone number is [phone].

## 2023-01-25 NOTE — Telephone Encounter (Signed)
*  STAT* If patient is at the pharmacy, call can be transferred to refill team.   1. Which medications need to be refilled? (please list name of each medication and dose if known) new prescription Ezetimibe  2. Which pharmacy/location (including street and city if local pharmacy) is medication to be sent to?[facility]  3. Do they need a 30 day or 90 day supply? 90 days and refills

## 2023-01-25 NOTE — Telephone Encounter (Signed)
Spoke with patient regarding prior message.Patient stated she will need samples of Trelegy 100 because with her insurance she is in the donuthole for the next 6 months. Advised patient that I will leave a few samples up front for patient to pick them up.   Patient's voice was understanding.Nothing else further needed.

## 2023-03-01 ENCOUNTER — Telehealth: Payer: Self-pay | Admitting: Pulmonary Disease

## 2023-03-01 MED ORDER — TRELEGY ELLIPTA 100-62.5-25 MCG/ACT IN AEPB: 1.0000 | INHALATION_SPRAY | Freq: Every day | RESPIRATORY_TRACT | Status: DC

## 2023-03-01 NOTE — Telephone Encounter (Signed)
Called pt and left detailed message informing her that Trelegy 100 samples will be left at the front desk for her and she can pick them up anytime. NFN

## 2023-03-01 NOTE — Telephone Encounter (Signed)
Pt calling in for samples of Trelegy

## 2023-03-26 DIAGNOSIS — Z23 Encounter for immunization: Secondary | ICD-10-CM | POA: Diagnosis not present

## 2023-04-10 DIAGNOSIS — Z23 Encounter for immunization: Secondary | ICD-10-CM | POA: Diagnosis not present

## 2023-04-22 ENCOUNTER — Telehealth: Payer: Self-pay | Admitting: *Deleted

## 2023-04-22 ENCOUNTER — Telehealth: Payer: Self-pay

## 2023-04-22 NOTE — Telephone Encounter (Signed)
Returned patient's phone call, spoke with patient

## 2023-04-23 ENCOUNTER — Ambulatory Visit (HOSPITAL_COMMUNITY)
Admission: RE | Admit: 2023-04-23 | Discharge: 2023-04-23 | Disposition: A | Discharge status: Home / Self Care | Payer: Medicare Other | Source: Ambulatory Visit | Attending: Radiology

## 2023-04-23 ENCOUNTER — Telehealth: Payer: Self-pay | Admitting: Pulmonary Disease

## 2023-04-23 DIAGNOSIS — I700 Atherosclerosis of aorta: Secondary | ICD-10-CM | POA: Diagnosis not present

## 2023-04-23 DIAGNOSIS — C3412 Malignant neoplasm of upper lobe, left bronchus or lung: Secondary | ICD-10-CM | POA: Diagnosis not present

## 2023-04-23 DIAGNOSIS — C3490 Malignant neoplasm of unspecified part of unspecified bronchus or lung: Secondary | ICD-10-CM | POA: Diagnosis not present

## 2023-04-23 DIAGNOSIS — J432 Centrilobular emphysema: Secondary | ICD-10-CM | POA: Diagnosis not present

## 2023-04-23 NOTE — Telephone Encounter (Signed)
Please call pt to say if we will leave her samples of Trelegy. She is in the doughnut hole.Her # is [phone]

## 2023-04-25 NOTE — Progress Notes (Signed)
Radiation Oncology         [phone] ________________________________  Name: Renee Navarro MRN: [id number]  Date: 04/26/2023  DOB: Nov 26, 1945  Follow-Up Visit Note  CC: Aliene Beams, MD  Josephine Igo, DO  No diagnosis found.  Diagnosis:  Squamous cell carcinoma of the left upper lobe, Clinical Stage IA1   Interval Since Last Radiation: 2 years, 10 months, and 21 days   Radiation Treatment Dates: 05/28/2020 through 06/04/2020 Site Technique Total Dose (Gy) Dose per Fx (Gy) Completed Fx Beam Energies  Lung, Left: Lung_Lt SBRT 54/54 18 3/3 6XFFF    Narrative:  The patient returns today for routine follow-up and to review recent imaging. She was last seen here for follow-up on 10/22/22.  Since her last visit, the patient followed up with Dr. Tonia Brooms on 11/09/22. During which time, the patient was noted to be doing really well from a respiratory standpoint since switching to Trelegy. She will continue with trelegy and albuterol as needed.     Her most recent chest CT on 04/23/23 demonstrates no evidence of local recurrence or metastatic disease in the chest, and stable radiation changes in the left lung apex. There is a slight increase in ill-defined perivascular ground-glass opacities in both lower lobes, which likely represents an inflammatory etiology.           No other significant oncologic interval history since the patient was last seen for follow-up.   ***                    Allergies:  is allergic to budesonide-formoterol fumarate and statins.  Meds: Current Outpatient Medications  Medication Sig Dispense Refill   albuterol (PROAIR HFA) 108 (90 Base) MCG/ACT inhaler Inhale 2 puffs into the lungs every 6 (six) hours as needed for shortness of breath. 8 g 3   denosumab (PROLIA) 60 MG/ML SOSY injection Inject 60 mg into the skin every 6 (six) months.     ezetimibe (ZETIA) 10 MG tablet Take 1 tablet (10 mg total) by mouth daily. 90 tablet 3   Fluticasone-Umeclidin-Vilant  (TRELEGY ELLIPTA) 100-62.5-25 MCG/ACT AEPB Inhale 1 puff into the lungs daily. 60 each 0   Fluticasone-Umeclidin-Vilant (TRELEGY ELLIPTA) 100-62.5-25 MCG/ACT AEPB Inhale 1 each into the lungs daily.     No current facility-administered medications for this encounter.    Physical Findings: The patient is in no acute distress. Patient is alert and oriented.  vitals were not taken for this visit. .  No significant changes. Lungs are clear to auscultation bilaterally. Heart has regular rate and rhythm. No palpable cervical, supraclavicular, or axillary adenopathy. Abdomen soft, non-tender, normal bowel sounds.   Lab Findings: Lab Results  Component Value Date   WBC 6.7 04/18/2020   HGB 15.1 (H) 04/18/2020   HCT 45.3 04/18/2020   MCV 90.0 04/18/2020   PLT 270.0 04/18/2020    Radiographic Findings: CT Chest Wo Contrast  Result Date: 04/23/2023 CLINICAL DATA:  Non-small cell lung cancer. Assess treatment response. * Tracking Code: BO * EXAM: CT CHEST WITHOUT CONTRAST TECHNIQUE: Multidetector CT imaging of the chest was performed following the standard protocol without IV contrast. RADIATION DOSE REDUCTION: This exam was performed according to the departmental dose-optimization program which includes automated exposure control, adjustment of the mA and/or kV according to patient size and/or use of iterative reconstruction technique. COMPARISON:  Chest CT 10/21/2022 and 04/21/2022. Additional older prior studies are correlated. FINDINGS: Cardiovascular: Atherosclerosis of the aorta, great vessels and coronary arteries. The heart  size is normal. There is no pericardial effusion. Mediastinum/Nodes: There are no enlarged mediastinal, hilar or axillary lymph nodes.Hilar assessment is limited by the lack of intravenous contrast, although the hilar contours appear unchanged. The thyroid gland, trachea and esophagus demonstrate no significant findings. Lungs/Pleura: No pleural effusion or pneumothorax.  Moderate centrilobular and paraseptal emphysema. Stable bandlike fibrosis surrounding the fiducial marker at the left lung apex consistent with prior radiation therapy. There is additional stable biapical scarring and scarring along the superior aspect of the right major fissure. Previously referenced 3 mm right lower lobe nodule on image 57/6 is unchanged. There are slightly increased ill-defined perivascular ground-glass opacities in both lower lobes (image 75/6 on the right and 79/6 on the left, the latter measuring 6 x 4 mm). No solid nodules or confluent airspace opacities are identified. Upper abdomen: The visualized upper abdomen appears stable without significant findings. Musculoskeletal/Chest wall: There is no chest wall mass or suspicious osseous finding. Cervical spondylosis noted. Unless specific follow-up recommendations are mentioned in the findings or impression sections, no imaging follow-up of any mentioned incidental findings is recommended. IMPRESSION: 1. Stable radiation changes at the left lung apex. 2. No evidence of local recurrence or metastatic disease. 3. Slightly increased ill-defined perivascular ground-glass opacities in both lower lobes, likely inflammatory. Given the patient's history, attention on follow-up CT in 3-6 months recommended. 4. Aortic Atherosclerosis (ICD10-I70.0) and Emphysema (ICD10-J43.9). Electronically Signed   By: Carey Bullocks M.D.   On: 04/23/2023 15:24    Impression:  Squamous cell carcinoma of the left upper lobe, Clinical Stage IA1   The patient is recovering from the effects of radiation.  ***  Plan:  ***   *** minutes of total time was spent for this patient encounter, including preparation, face-to-face counseling with the patient and coordination of care, physical exam, and documentation of the encounter. ____________________________________  Billie Lade, PhD, MD  This document serves as a record of services personally performed by Antony Blackbird, MD. It was created on his behalf by Neena Rhymes, a trained medical scribe. The creation of this record is based on the scribe's personal observations and the provider's statements to them. This document has been checked and approved by the attending provider.

## 2023-04-26 ENCOUNTER — Ambulatory Visit
Admission: RE | Admit: 2023-04-26 | Discharge: 2023-04-26 | Disposition: A | Discharge status: Home / Self Care | Payer: Medicare Other | Source: Ambulatory Visit | Attending: Radiation Oncology | Admitting: Radiation Oncology

## 2023-04-26 ENCOUNTER — Encounter: Payer: Self-pay | Admitting: Radiation Oncology

## 2023-04-26 VITALS — BP 137/88 | HR 66 | Temp 97.5°F | Resp 18 | Ht 64.0 in | Wt 111.0 lb

## 2023-04-26 DIAGNOSIS — Z923 Personal history of irradiation: Secondary | ICD-10-CM | POA: Insufficient documentation

## 2023-04-26 DIAGNOSIS — J432 Centrilobular emphysema: Secondary | ICD-10-CM | POA: Insufficient documentation

## 2023-04-26 DIAGNOSIS — M47812 Spondylosis without myelopathy or radiculopathy, cervical region: Secondary | ICD-10-CM | POA: Diagnosis not present

## 2023-04-26 DIAGNOSIS — I700 Atherosclerosis of aorta: Secondary | ICD-10-CM | POA: Diagnosis not present

## 2023-04-26 DIAGNOSIS — Z85118 Personal history of other malignant neoplasm of bronchus and lung: Secondary | ICD-10-CM | POA: Insufficient documentation

## 2023-04-26 DIAGNOSIS — Z87891 Personal history of nicotine dependence: Secondary | ICD-10-CM | POA: Diagnosis not present

## 2023-04-26 DIAGNOSIS — C3412 Malignant neoplasm of upper lobe, left bronchus or lung: Secondary | ICD-10-CM | POA: Diagnosis not present

## 2023-04-26 NOTE — Progress Notes (Addendum)
Renee Navarro is here today for follow up post radiation to the lung.  Lung Side: Left  Does the patient complain of any of the following: Pain:No Shortness of breath w/wo exertion: No Cough: No Hemoptysis: No Pain with swallowing: No Swallowing/choking concerns: No Appetite: Good Energy Level: High Post radiation skin Changes: No  Any concerns: She poses a question about why she has never had  a CT with contrast. Also, if he exam her lower back     BP 137/88 (BP Location: Left Arm, Patient Position: Sitting)   Pulse 66   Temp (!) 97.5 F (36.4 C) (Temporal)   Resp 18   Ht 5\' 4"  (1.626 m)   Wt 111 lb (50.3 kg)   LMP  (LMP Unknown)   SpO2 99%   BMI 19.05 kg/m

## 2023-04-27 NOTE — Telephone Encounter (Signed)
Samples of trelegy was left at the front desk awaiting pick up. Nfn

## 2023-04-27 NOTE — Telephone Encounter (Signed)
Patient needs samples of Trelegy. She is in a donut hole with her insurance company and cannot afford it until January of 2025.

## 2023-05-05 DIAGNOSIS — Z01419 Encounter for gynecological examination (general) (routine) without abnormal findings: Secondary | ICD-10-CM | POA: Diagnosis not present

## 2023-05-18 DIAGNOSIS — Z01419 Encounter for gynecological examination (general) (routine) without abnormal findings: Secondary | ICD-10-CM | POA: Diagnosis not present

## 2023-05-18 DIAGNOSIS — Z124 Encounter for screening for malignant neoplasm of cervix: Secondary | ICD-10-CM | POA: Diagnosis not present

## 2023-05-18 DIAGNOSIS — Z1151 Encounter for screening for human papillomavirus (HPV): Secondary | ICD-10-CM | POA: Diagnosis not present

## 2023-05-19 DIAGNOSIS — Z23 Encounter for immunization: Secondary | ICD-10-CM | POA: Diagnosis not present

## 2023-05-19 DIAGNOSIS — Z0184 Encounter for antibody response examination: Secondary | ICD-10-CM | POA: Diagnosis not present

## 2023-05-21 ENCOUNTER — Other Ambulatory Visit: Payer: Medicare Other

## 2023-05-21 ENCOUNTER — Ambulatory Visit
Admission: RE | Admit: 2023-05-21 | Discharge: 2023-05-21 | Disposition: A | Discharge status: Home / Self Care | Payer: Medicare Other | Source: Ambulatory Visit | Attending: Family Medicine

## 2023-05-21 ENCOUNTER — Ambulatory Visit: Payer: Medicare Other

## 2023-05-21 DIAGNOSIS — Z1231 Encounter for screening mammogram for malignant neoplasm of breast: Secondary | ICD-10-CM

## 2023-05-21 DIAGNOSIS — M810 Age-related osteoporosis without current pathological fracture: Secondary | ICD-10-CM

## 2023-05-21 DIAGNOSIS — M8588 Other specified disorders of bone density and structure, other site: Secondary | ICD-10-CM | POA: Diagnosis not present

## 2023-05-21 DIAGNOSIS — N958 Other specified menopausal and perimenopausal disorders: Secondary | ICD-10-CM | POA: Diagnosis not present

## 2023-05-28 ENCOUNTER — Ambulatory Visit: Payer: Medicare Other

## 2023-05-31 ENCOUNTER — Telehealth: Payer: Self-pay | Admitting: Pulmonary Disease

## 2023-05-31 NOTE — Telephone Encounter (Signed)
Patient needs more samples of Trelegy. She currently has 6 days left but needs enough to last her until December. Please call if samples can be given. [phone]

## 2023-06-01 NOTE — Telephone Encounter (Signed)
Calling again for samples.

## 2023-06-03 DIAGNOSIS — M810 Age-related osteoporosis without current pathological fracture: Secondary | ICD-10-CM | POA: Diagnosis not present

## 2023-06-03 NOTE — Telephone Encounter (Signed)
PT is calling again for samples. She will be at [facility] today and it would be a great time for her to come by for these samples. She calls first thing every morning. I let her know I would put back a message again. TY.

## 2023-06-03 NOTE — Telephone Encounter (Signed)
Spoke with patient regarding prior message . Patient is going to pick up a few samples of Trellegy 100.  Advised patient to bring in ID when she picks up samples.  Patient's voice was understanding.Nothing else further needed.

## 2023-07-13 DIAGNOSIS — Z0000 Encounter for general adult medical examination without abnormal findings: Secondary | ICD-10-CM | POA: Diagnosis not present

## 2023-07-13 DIAGNOSIS — M810 Age-related osteoporosis without current pathological fracture: Secondary | ICD-10-CM | POA: Diagnosis not present

## 2023-07-13 DIAGNOSIS — Z23 Encounter for immunization: Secondary | ICD-10-CM | POA: Diagnosis not present

## 2023-07-13 DIAGNOSIS — Z1159 Encounter for screening for other viral diseases: Secondary | ICD-10-CM | POA: Diagnosis not present

## 2023-07-13 DIAGNOSIS — R252 Cramp and spasm: Secondary | ICD-10-CM | POA: Diagnosis not present

## 2023-07-13 DIAGNOSIS — E559 Vitamin D deficiency, unspecified: Secondary | ICD-10-CM | POA: Diagnosis not present

## 2023-07-13 DIAGNOSIS — E7800 Pure hypercholesterolemia, unspecified: Secondary | ICD-10-CM | POA: Diagnosis not present

## 2023-07-13 DIAGNOSIS — J449 Chronic obstructive pulmonary disease, unspecified: Secondary | ICD-10-CM | POA: Diagnosis not present

## 2023-07-13 DIAGNOSIS — J3489 Other specified disorders of nose and nasal sinuses: Secondary | ICD-10-CM | POA: Diagnosis not present

## 2023-07-22 ENCOUNTER — Telehealth: Payer: Self-pay | Admitting: *Deleted

## 2023-07-22 NOTE — Telephone Encounter (Signed)
CALLED PATIENT TO INFORM OF CT FOR 10-25-23- ARRIVAL TIME- 9:15 AM, NO RESTRICTIONS TO SCAN , PATIENT TO RECEIVE RESULTS FROM DR. KINARD ON 10-28-23 @ 11:15 AM, SPOKE WITH PATIENT AND SHE IS AWARE OF THESE APPTS. AND THE INSTRUCTIONS

## 2023-07-23 ENCOUNTER — Telehealth: Payer: Self-pay | Admitting: *Deleted

## 2023-07-23 NOTE — Telephone Encounter (Signed)
RETURNED PATIENT'S PHONE CALL, SPOKE WITH PATIENT. ?

## 2023-07-29 ENCOUNTER — Telehealth: Payer: Self-pay | Admitting: Pulmonary Disease

## 2023-07-29 NOTE — Telephone Encounter (Signed)
 Patient needs refill of Trelegy 100mg .  Pharmacy: [facility] [phone]

## 2023-07-30 ENCOUNTER — Other Ambulatory Visit: Payer: Self-pay | Admitting: Pulmonary Disease

## 2023-07-30 NOTE — Telephone Encounter (Signed)
 Patient would like a call once the prescription is filled. [phone]

## 2023-08-02 MED ORDER — TRELEGY ELLIPTA 100-62.5-25 MCG/ACT IN AEPB: 1.0000 | INHALATION_SPRAY | Freq: Every day | RESPIRATORY_TRACT | 2 refills | Status: DC

## 2023-08-02 NOTE — Telephone Encounter (Signed)
 ATC patient.  Left detailed message to let a patient know Trelegy prescription was sent to requested [facility].  Call back number left for any questions or concerns.

## 2023-08-05 ENCOUNTER — Telehealth: Payer: Self-pay | Admitting: Pulmonary Disease

## 2023-08-05 NOTE — Telephone Encounter (Signed)
 Answering Service 08/01/22:  Patient states [facility] has not received rx for Trelegy. She is out. Sent request Friday.

## 2023-08-07 MED ORDER — TRELEGY ELLIPTA 100-62.5-25 MCG/ACT IN AEPB: 1.0000 | INHALATION_SPRAY | Freq: Every day | RESPIRATORY_TRACT | 3 refills | Status: AC

## 2023-08-07 NOTE — Telephone Encounter (Signed)
 Resent the Rx for pt's Trelegy to [facility].

## 2023-08-20 DIAGNOSIS — H5213 Myopia, bilateral: Secondary | ICD-10-CM | POA: Diagnosis not present

## 2023-08-20 DIAGNOSIS — H25813 Combined forms of age-related cataract, bilateral: Secondary | ICD-10-CM | POA: Diagnosis not present

## 2023-08-20 DIAGNOSIS — H43813 Vitreous degeneration, bilateral: Secondary | ICD-10-CM | POA: Diagnosis not present

## 2023-08-20 DIAGNOSIS — H524 Presbyopia: Secondary | ICD-10-CM | POA: Diagnosis not present

## 2023-08-20 DIAGNOSIS — H35411 Lattice degeneration of retina, right eye: Secondary | ICD-10-CM | POA: Diagnosis not present

## 2023-08-20 DIAGNOSIS — H52223 Regular astigmatism, bilateral: Secondary | ICD-10-CM | POA: Diagnosis not present

## 2023-09-16 NOTE — Progress Notes (Signed)
  Cardiology Office Note:  .   Date:  09/17/2023  ID:  Renee Navarro, DOB 1945/12/08, MRN [id number] PCP: Renee Beams, MD  [facility] Providers Cardiologist:  Renee Red, MD {  History of Present Illness: .   Renee Navarro is a 78 y.o. female  with a hx of coronary artery calcification, aortic atherosclerosis, hypercholesterolemia, palpitations, prior lung cancer who is seen for follow up. She was last seen by Dr. Dulce Sellar on 11/13/21 and established care with me on 09/08/22.   Pertinent CV history: CT chests have noted aortic atherosclerosis and coronary artery calcification.   Today: Doing great. 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 Stays very active.   Palpitations are rare, brief (seconds), and nonlimiting.  ROS: Denies chest pain, shortness of breath at rest or with normal exertion. No PND, orthopnea, LE edema or unexpected weight gain. No syncope. ROS otherwise negative except as noted.   Studies Reviewed: Marland Kitchen    EKG:  EKG Interpretation Date/Time:  Friday September 17 2023 10:20:52 EST Ventricular Rate:  61 PR Interval:  118 QRS Duration:  88 QT Interval:  414 QTC Calculation: 416 R Axis:   -22  Text Interpretation: Normal sinus rhythm Septal infarct , age undetermined Non-specific ST-t changes unchanged Confirmed by Renee Navarro [phone]) on 09/17/2023 10:56:34 AM    Physical Exam:   VS:  BP 118/62   Pulse 73   Ht 5\' 4"  (1.626 m)   Wt 111 lb 12.8 oz (50.7 kg)   LMP  (LMP Unknown)   SpO2 97%   BMI 19.19 kg/m    Wt Readings from Last 3 Encounters:  09/17/23 111 lb 12.8 oz (50.7 kg)  04/26/23 111 lb (50.3 kg)  11/09/22 112 lb 9.6 oz (51.1 kg)    GEN: Well nourished, well developed in no acute distress HEENT: Normal, moist mucous membranes NECK: No JVD CARDIAC: regular rhythm, normal S1 and S2, no rubs or gallops. No murmur. VASCULAR: Radial and DP pulses  2+ bilaterally. No carotid bruits RESPIRATORY:  Clear to auscultation without rales, wheezing or rhonchi  ABDOMEN: Soft, non-tender, non-distended MUSCULOSKELETAL:  Ambulates independently SKIN: Warm and dry, no edema NEUROLOGIC:  Alert and oriented x 3. No focal neuro deficits noted. PSYCHIATRIC:  Normal affect    ASSESSMENT AND PLAN: .    Palpitations -minimal, brief, nonlimiting.  -ECG unremarkable -she will contact me if symptoms worsen  Calcification of coronary arteries Hypercholesterolemia -did not tolerate statins in the past -tolerating ezetimibe. Discussed guidelines, options. After shared decision making, will not change therapy at this time -reviewed Navarro flag warning signs that need immediate medical attention -personally reviewed lipids from [facility] 07/13/23: Tchol 247, LDL 133, HDL 93, TG 121  CV risk counseling and prevention -recommend heart healthy/Mediterranean diet, with whole grains, fruits, vegetable, fish, lean meats, nuts, and olive oil. Limit salt. -recommend moderate walking, 3-5 times/week for 30-50 minutes each session. Aim for at least 150 minutes.week. Goal should be pace of 3 miles/hours, or walking 1.5 miles in 30 minutes -recommend avoidance of tobacco products. Avoid excess alcohol.  Dispo: 1 year or sooner as needed  Signed, Renee Red, MD   Renee Red, MD, PhD, [facility]  [facility]    Heart & Vascular at [facility] [address] [place], [place] [address] [phone]

## 2023-09-17 ENCOUNTER — Encounter (HOSPITAL_BASED_OUTPATIENT_CLINIC_OR_DEPARTMENT_OTHER): Payer: Self-pay | Admitting: Cardiology

## 2023-09-17 ENCOUNTER — Institutional Professional Consult (permissible substitution) (INDEPENDENT_AMBULATORY_CARE_PROVIDER_SITE_OTHER): Payer: Medicare Other

## 2023-09-17 ENCOUNTER — Ambulatory Visit (HOSPITAL_BASED_OUTPATIENT_CLINIC_OR_DEPARTMENT_OTHER): Payer: Medicare Other | Admitting: Cardiology

## 2023-09-17 VITALS — BP 118/62 | HR 73 | Ht 64.0 in | Wt 111.8 lb

## 2023-09-17 DIAGNOSIS — I2510 Atherosclerotic heart disease of native coronary artery without angina pectoris: Secondary | ICD-10-CM

## 2023-09-17 DIAGNOSIS — R002 Palpitations: Secondary | ICD-10-CM

## 2023-09-17 DIAGNOSIS — E7800 Pure hypercholesterolemia, unspecified: Secondary | ICD-10-CM

## 2023-09-17 DIAGNOSIS — Z7189 Other specified counseling: Secondary | ICD-10-CM | POA: Diagnosis not present

## 2023-09-17 DIAGNOSIS — I700 Atherosclerosis of aorta: Secondary | ICD-10-CM | POA: Diagnosis not present

## 2023-09-17 MED ORDER — EZETIMIBE 10 MG PO TABS: 10.0000 mg | ORAL_TABLET | Freq: Every day | ORAL | 3 refills | Status: AC

## 2023-09-17 NOTE — Patient Instructions (Signed)
Medication Instructions:  Your physician recommends that you continue on your current medications as directed. Please refer to the Current Medication list given to you today.  Follow-Up: At [facility], you and your health needs are our priority.  As part of our continuing mission to provide you with exceptional heart care, we have created designated Provider Care Teams.  These Care Teams include your primary Cardiologist (physician) and Advanced Practice Providers (APPs -  Physician Assistants and Nurse Practitioners) who all work together to provide you with the care you need, when you need it.  We recommend signing up for the patient portal called "MyChart".  Sign up information is provided on this After Visit Summary.  MyChart is used to connect with patients for Virtual Visits (Telemedicine).  Patients are able to view lab/test results, encounter notes, upcoming appointments, etc.  Non-urgent messages can be sent to your provider as well.   To learn more about what you can do with MyChart, go to [email or web address].    Your next appointment:   1 year  Provider:   Jodelle Red, MD

## 2023-09-23 ENCOUNTER — Telehealth (INDEPENDENT_AMBULATORY_CARE_PROVIDER_SITE_OTHER): Payer: Self-pay | Admitting: Otolaryngology

## 2023-09-23 NOTE — Telephone Encounter (Signed)
 Left vm to confirm appt and address for 09/24/2023.

## 2023-09-24 ENCOUNTER — Ambulatory Visit (INDEPENDENT_AMBULATORY_CARE_PROVIDER_SITE_OTHER): Payer: Medicare Other | Admitting: Otolaryngology

## 2023-09-24 VITALS — BP 121/73 | HR 60 | Ht 64.0 in | Wt 110.0 lb

## 2023-09-24 DIAGNOSIS — D485 Neoplasm of uncertain behavior of skin: Secondary | ICD-10-CM | POA: Diagnosis not present

## 2023-09-24 NOTE — Progress Notes (Signed)
 Patient ID: Renee Navarro, female   DOB: [birth year], 78 y.o.   MRN: [id number]  CC: Left nasal lesion  HPI:  Renee Navarro is a 78 y.o. female who presents today for evaluation of a left nostril lesion.  According to the patient, she first noted a bump inside her left nostril 2 years ago.  The size of the lesion has been stable.  It is nontender to touch.  She is able to breathe through both nostrils.  She denies any crusting, drainage, or other surface lesion.  She has no previous nasal surgery.  Past Medical History:  Diagnosis Date   Abnormal EKG 09/20/2018   Chronic bronchitis (HCC) 10/03/2014   COPD (chronic obstructive pulmonary disease) (HCC)    Dyslipidemia    diet controlled   Dyspnea    with exertion   History of radiation therapy 05/28/2020-06/04/2020   3 SBRT treatments to left lung; Dr Antony Blackbird   Lung nodule 02/29/2012   3/24/2014CT Chest: no change in RUL lung nodule, likely benign. No change since 2008  No further CTs needed    Lung nodule seen on imaging study    Obstructive chronic bronchitis without exacerbation gold stage A COPD 11/17/2007   Osteoporosis    Pneumonia    x 1   Pneumonia    x 1   Primary cancer of left upper lobe of lung (HCC) 05/09/2020   RBBB (right bundle branch block with left anterior fascicular block) 09/22/2018   Tobacco user    quit 06/2012    Past Surgical History:  Procedure Laterality Date   BRONCHIAL BIOPSY  04/30/2020   Procedure: BRONCHIAL BIOPSIES;  Surgeon: Josephine Igo, DO;  Location: [facility];  Service: Pulmonary;;   BRONCHIAL BRUSHINGS  04/30/2020   Procedure: BRONCHIAL BRUSHINGS;  Surgeon: Josephine Igo, DO;  Location: [facility];  Service: Pulmonary;;   BRONCHIAL NEEDLE ASPIRATION BIOPSY  04/30/2020   Procedure: BRONCHIAL NEEDLE ASPIRATION BIOPSIES;  Surgeon: Josephine Igo, DO;  Location: [facility];  Service: Pulmonary;;   BRONCHIAL WASHINGS  04/30/2020   Procedure: BRONCHIAL WASHINGS;  Surgeon: Josephine Igo, DO;  Location: [facility];  Service: Pulmonary;;   colonoscopy     FIDUCIAL MARKER PLACEMENT  04/30/2020   Procedure: FIDUCIAL MARKER PLACEMENT;  Surgeon: Josephine Igo, DO;  Location: [facility];  Service: Pulmonary;;   TONSILLECTOMY     TUBAL LIGATION     VIDEO BRONCHOSCOPY WITH ENDOBRONCHIAL NAVIGATION N/A 04/30/2020   Procedure: VIDEO BRONCHOSCOPY WITH ENDOBRONCHIAL NAVIGATION;  Surgeon: Josephine Igo, DO;  Location: [facility];  Service: Pulmonary;  Laterality: N/A;   WISDOM TOOTH EXTRACTION     WRIST SURGERY Left     Family History  Problem Relation Age of Onset   Colon cancer Father    Diabetes Sister    Hypertension Sister    Lymphoma Brother     Social History:  reports that she quit smoking about 11 years ago. Her smoking use included cigarettes. She started smoking about 62 years ago. She has a 38.3 pack-year smoking history. She has been exposed to tobacco smoke. She has never used smokeless tobacco. She reports current alcohol use. She reports that she does not use drugs.  Allergies:  Allergies  Allergen Reactions   Budesonide-Formoterol Fumarate Cough   Statins Other (See Comments)    Trouble urinating    Prior to Admission medications   Medication Sig Start Date End Date Taking? Authorizing Provider  albuterol (PROAIR HFA) 108 (  90 Base) MCG/ACT inhaler Inhale 2 puffs into the lungs every 6 (six) hours as needed for shortness of breath. 10/23/21  Yes Icard, Rachel Bo, DO  Calcium Citrate-Vitamin D 315-5 MG-MCG TABS Take 5 mcg by mouth 2 (two) times daily.   Yes [provider]  CALCIUM PO Take 650 mg by mouth daily.   Yes [provider]  denosumab (PROLIA) 60 MG/ML SOSY injection Inject 60 mg into the skin every 6 (six) months.   Yes [provider]  ezetimibe (ZETIA) 10 MG tablet Take 1 tablet (10 mg total) by mouth daily. 09/17/23  Yes Jodelle Red, MD  Fluticasone-Umeclidin-Vilant (TRELEGY ELLIPTA)  100-62.5-25 MCG/ACT AEPB Inhale 1 each into the lungs daily. 08/07/23  Yes Icard, Bradley L, DO  MAGNESIUM PO Take 200 mg by mouth 2 (two) times daily.   Yes [provider]  VITAMIN D PO Take 2,000 Int'l Units/day by mouth daily. Takes 4 tabs daily.   Yes [provider]    Blood pressure 121/73, pulse 60, height 5\' 4"  (1.626 m), weight 110 lb (49.9 kg), SpO2 94%. Exam: General: Communicates without difficulty, well nourished, no acute distress. Head: Normocephalic, no evidence injury, no tenderness, facial buttresses intact without stepoff. Face/sinus: No tenderness to palpation and percussion. Facial movement is normal and symmetric. Eyes: PERRL, EOMI. No scleral icterus, conjunctivae clear. Neuro: CN II exam reveals vision grossly intact.  No nystagmus at any point of gaze. Ears: Auricles well formed without lesions.  Ear canals are intact without mass or lesion.  No erythema or edema is appreciated.  The TMs are intact without fluid. Nose: External evaluation reveals normal support and skin without lesions.  Dorsum is intact.  Anterior rhinoscopy reveals normal mucosa over anterior aspect of inferior turbinates and intact septum.  No purulence noted.  A mobile subcutaneous 5 mm nodule is noted within the left superior nostril.  No surface ulceration is noted.  Oral:  Oral cavity and oropharynx are intact, symmetric, without erythema or edema.  Mucosa is moist without lesions. Neck: Full range of motion without pain.  There is no significant lymphadenopathy.  No masses palpable.  Thyroid bed within normal limits to palpation.  Parotid glands and submandibular glands equal bilaterally without mass.  Trachea is midline. Neuro:  CN 2-12 grossly intact.   Assessment: 1.  The patient has a mobile 5 mm subcutaneous nodule within the left superior nostril.  No surface ulceration or drainage is noted.  The patient is currently asymptomatic. 2.  The rest of her ENT exam is  unremarkable.  Plan: 1.  The physical exam findings are reviewed with the patient. 2.  The treatment options are discussed.  The options include conservative observation versus surgical excision. 3.  The patient is interested in proceeding with conservative observation for now. 4.  The patient will return for reevaluation in 2 months.  Yeray Tomas W Vern Prestia 09/24/2023, 9:34 AM

## 2023-10-17 DIAGNOSIS — J209 Acute bronchitis, unspecified: Secondary | ICD-10-CM | POA: Diagnosis not present

## 2023-10-17 DIAGNOSIS — R062 Wheezing: Secondary | ICD-10-CM | POA: Diagnosis not present

## 2023-10-17 DIAGNOSIS — R112 Nausea with vomiting, unspecified: Secondary | ICD-10-CM | POA: Diagnosis not present

## 2023-10-17 DIAGNOSIS — Z8709 Personal history of other diseases of the respiratory system: Secondary | ICD-10-CM | POA: Diagnosis not present

## 2023-10-21 ENCOUNTER — Telehealth: Payer: Self-pay | Admitting: *Deleted

## 2023-10-21 NOTE — Telephone Encounter (Signed)
 CALLED PATIENT TO ASK ABOUT CHANGING FU APPT. TO 10-26-23 @ 11:15, DUE TO  DR. KINARD BEING OFF ON N 10-28-23, SPOKE WITH PATIENT AND SHE AGREED TO COME

## 2023-10-23 ENCOUNTER — Other Ambulatory Visit: Payer: Self-pay

## 2023-10-23 ENCOUNTER — Emergency Department (HOSPITAL_COMMUNITY)

## 2023-10-23 ENCOUNTER — Encounter (HOSPITAL_COMMUNITY): Payer: Self-pay

## 2023-10-23 ENCOUNTER — Emergency Department (HOSPITAL_COMMUNITY)
Admission: EM | Admit: 2023-10-23 | Discharge: 2023-10-23 | Disposition: A | Discharge status: Home / Self Care | Attending: Emergency Medicine | Admitting: Emergency Medicine

## 2023-10-23 DIAGNOSIS — J181 Lobar pneumonia, unspecified organism: Secondary | ICD-10-CM | POA: Insufficient documentation

## 2023-10-23 DIAGNOSIS — Z85118 Personal history of other malignant neoplasm of bronchus and lung: Secondary | ICD-10-CM | POA: Insufficient documentation

## 2023-10-23 DIAGNOSIS — R0602 Shortness of breath: Secondary | ICD-10-CM | POA: Diagnosis not present

## 2023-10-23 DIAGNOSIS — J189 Pneumonia, unspecified organism: Secondary | ICD-10-CM | POA: Diagnosis not present

## 2023-10-23 DIAGNOSIS — J449 Chronic obstructive pulmonary disease, unspecified: Secondary | ICD-10-CM | POA: Insufficient documentation

## 2023-10-23 DIAGNOSIS — R918 Other nonspecific abnormal finding of lung field: Secondary | ICD-10-CM | POA: Diagnosis not present

## 2023-10-23 DIAGNOSIS — J441 Chronic obstructive pulmonary disease with (acute) exacerbation: Secondary | ICD-10-CM | POA: Diagnosis not present

## 2023-10-23 DIAGNOSIS — Z7951 Long term (current) use of inhaled steroids: Secondary | ICD-10-CM | POA: Diagnosis not present

## 2023-10-23 LAB — CBC WITH DIFFERENTIAL/PLATELET
Abs Immature Granulocytes: 0.13 10*3/uL — ABNORMAL HIGH (ref 0.00–0.07)
Basophils Absolute: 0 10*3/uL (ref 0.0–0.1)
Basophils Relative: 0 %
Eosinophils Absolute: 0 10*3/uL (ref 0.0–0.5)
Eosinophils Relative: 0 %
HCT: 39.9 % (ref 36.0–46.0)
Hemoglobin: 13.4 g/dL (ref 12.0–15.0)
Immature Granulocytes: 1 %
Lymphocytes Relative: 7 %
Lymphs Abs: 1.5 10*3/uL (ref 0.7–4.0)
MCH: 30.7 pg (ref 26.0–34.0)
MCHC: 33.6 g/dL (ref 30.0–36.0)
MCV: 91.3 fL (ref 80.0–100.0)
Monocytes Absolute: 0.6 10*3/uL (ref 0.1–1.0)
Monocytes Relative: 3 %
Neutro Abs: 20.5 10*3/uL — ABNORMAL HIGH (ref 1.7–7.7)
Neutrophils Relative %: 89 %
Platelets: 293 10*3/uL (ref 150–400)
RBC: 4.37 MIL/uL (ref 3.87–5.11)
RDW: 14 % (ref 11.5–15.5)
WBC: 22.8 10*3/uL — ABNORMAL HIGH (ref 4.0–10.5)
nRBC: 0 % (ref 0.0–0.2)

## 2023-10-23 LAB — RESP PANEL BY RT-PCR (RSV, FLU A&B, COVID)  RVPGX2
Influenza A by PCR: NEGATIVE
Influenza B by PCR: NEGATIVE
Resp Syncytial Virus by PCR: NEGATIVE
SARS Coronavirus 2 by RT PCR: NEGATIVE

## 2023-10-23 LAB — BASIC METABOLIC PANEL WITH GFR
Anion gap: 9 (ref 5–15)
BUN: 25 mg/dL — ABNORMAL HIGH (ref 8–23)
CO2: 24 mmol/L (ref 22–32)
Calcium: 8.4 mg/dL — ABNORMAL LOW (ref 8.9–10.3)
Chloride: 97 mmol/L — ABNORMAL LOW (ref 98–111)
Creatinine, Ser: 0.97 mg/dL (ref 0.44–1.00)
GFR, Estimated: >60 mL/min (ref >60)
Glucose, Bld: 145 mg/dL — ABNORMAL HIGH (ref 70–99)
Potassium: 3.2 mmol/L — ABNORMAL LOW (ref 3.5–5.1)
Sodium: 130 mmol/L — ABNORMAL LOW (ref 135–145)

## 2023-10-23 LAB — MAGNESIUM: Magnesium: 2 mg/dL (ref 1.7–2.4)

## 2023-10-23 LAB — BRAIN NATRIURETIC PEPTIDE: B Natriuretic Peptide: 93 pg/mL (ref 0.0–100.0)

## 2023-10-23 MED ORDER — ALBUTEROL SULFATE (2.5 MG/3ML) 0.083% IN NEBU: 2.5000 mg | INHALATION_SOLUTION | RESPIRATORY_TRACT | Status: DC | PRN

## 2023-10-23 MED ORDER — AMOXICILLIN-POT CLAVULANATE 875-125 MG PO TABS
1.0000 | ORAL_TABLET | Freq: Once | ORAL | Status: AC
  Administered 2023-10-23: 1 via ORAL
  Filled 2023-10-23: qty 1

## 2023-10-23 MED ORDER — IPRATROPIUM-ALBUTEROL 0.5-2.5 (3) MG/3ML IN SOLN
3.0000 mL | Freq: Once | RESPIRATORY_TRACT | Status: AC
  Administered 2023-10-23: 3 mL via RESPIRATORY_TRACT
  Filled 2023-10-23: qty 3

## 2023-10-23 MED ORDER — METHYLPREDNISOLONE SODIUM SUCC 125 MG IJ SOLR
125.0000 mg | Freq: Once | INTRAMUSCULAR | Status: AC
  Administered 2023-10-23: 125 mg via INTRAVENOUS
  Filled 2023-10-23: qty 2

## 2023-10-23 MED ORDER — AMOXICILLIN-POT CLAVULANATE 875-125 MG PO TABS: 1.0000 | ORAL_TABLET | Freq: Two times a day (BID) | ORAL | 0 refills | Status: AC

## 2023-10-23 NOTE — ED Provider Notes (Signed)
 [place] EMERGENCY DEPARTMENT AT [facility] Provider Note   CSN: [id number] Arrival date & time: 10/23/23  1620     History {Add pertinent medical, surgical, social history, OB history to HPI:1} Chief Complaint  Patient presents with   Shortness of Breath    Renee Navarro is a 78 y.o. female.  She has a history of lung cancer, COPD.  She does not normally require oxygen.  She said she started feeling sick last week and went to urgent care.  They prescribed her prednisone.  She said she felt a little bit better until today when she woke up feeling tired and lightheaded and more short of breath again.  She went to urgent care again where they found her oxygen saturation to be low and did a chest x-ray.  They sent her out with prescriptions for doxycycline and another course of prednisone but called her and told her she should go to the emergency department.  She denies any fever chest pain abdominal pain vomiting diarrhea.  She said she feels pretty good and does not feel like she needs to be in the hospital.  Nonproductive cough.  The history is provided by the patient.  Shortness of Breath Severity:  Moderate Onset quality:  Gradual Duration:  2 weeks Timing:  Constant Progression:  Unchanged Chronicity:  Recurrent Relieved by:  Nothing Worsened by:  Activity Ineffective treatments:  Inhaler Associated symptoms: cough   Associated symptoms: no abdominal pain, no chest pain, no fever, no hemoptysis and no sputum production        Home Medications Prior to Admission medications   Medication Sig Start Date End Date Taking? Authorizing Provider  albuterol (PROAIR HFA) 108 (90 Base) MCG/ACT inhaler Inhale 2 puffs into the lungs every 6 (six) hours as needed for shortness of breath. 10/23/21   Josephine Igo, DO  Calcium Citrate-Vitamin D 315-5 MG-MCG TABS Take 5 mcg by mouth 2 (two) times daily.    [provider]  CALCIUM PO Take 650 mg by mouth daily.     [provider]  denosumab (PROLIA) 60 MG/ML SOSY injection Inject 60 mg into the skin every 6 (six) months.    [provider]  ezetimibe (ZETIA) 10 MG tablet Take 1 tablet (10 mg total) by mouth daily. 09/17/23   Jodelle Red, MD  Fluticasone-Umeclidin-Vilant (TRELEGY ELLIPTA) 100-62.5-25 MCG/ACT AEPB Inhale 1 each into the lungs daily. 08/07/23   Josephine Igo, DO  MAGNESIUM PO Take 200 mg by mouth 2 (two) times daily.    [provider]  VITAMIN D PO Take 2,000 Int'l Units/day by mouth daily. Takes 4 tabs daily.    [provider]      Allergies    Budesonide-formoterol fumarate and Statins    Review of Systems   Review of Systems  Constitutional:  Negative for fever.  Respiratory:  Positive for cough and shortness of breath. Negative for hemoptysis and sputum production.   Cardiovascular:  Negative for chest pain.  Gastrointestinal:  Negative for abdominal pain.    Physical Exam Updated Vital Signs BP 104/65   Pulse 85   Temp 98.1 F (36.7 C) (Oral)   Resp 20   Ht 5\' 4"  (1.626 m)   Wt 49.9 kg   LMP  (LMP Unknown)   SpO2 91%   BMI 18.88 kg/m  Physical Exam Vitals and nursing note reviewed.  Constitutional:      General: She is not in acute distress.  Appearance: She is well-developed.  HENT:     Head: Normocephalic and atraumatic.  Eyes:     Conjunctiva/sclera: Conjunctivae normal.  Cardiovascular:     Rate and Rhythm: Normal rate and regular rhythm.     Heart sounds: No murmur heard. Pulmonary:     Effort: Pulmonary effort is normal. No respiratory distress.     Breath sounds: Normal breath sounds.  Abdominal:     Palpations: Abdomen is soft.     Tenderness: There is no abdominal tenderness.  Musculoskeletal:        General: No swelling.     Cervical back: Neck supple.     Right lower leg: No tenderness. No edema.     Left lower leg: No tenderness. No edema.  Skin:    General: Skin is warm and dry.      Capillary Refill: Capillary refill takes less than 2 seconds.  Neurological:     General: No focal deficit present.     Mental Status: She is alert.     ED Results / Procedures / Treatments   Labs (all labs ordered are listed, but only abnormal results are displayed) Labs Reviewed - No data to display  EKG EKG Interpretation Date/Time:  Saturday October 23 2023 16:49:12 EDT Ventricular Rate:  82 PR Interval:  139 QRS Duration:  99 QT Interval:  421 QTC Calculation: 492 R Axis:   265  Text Interpretation: Sinus rhythm RAE, consider biatrial enlargement Left anterior fascicular block Right ventricular hypertrophy Nonspecific T abnormalities, lateral leads Borderline prolonged QT interval Confirmed by Meridee Score [phone]) on 10/23/2023 4:53:25 PM  Radiology No results found.  Procedures Procedures  {Document cardiac monitor, telemetry assessment procedure when appropriate:1}  Medications Ordered in ED Medications  albuterol (PROVENTIL) (2.5 MG/3ML) 0.083% nebulizer solution 2.5 mg (has no administration in time range)  ipratropium-albuterol (DUONEB) 0.5-2.5 (3) MG/3ML nebulizer solution 3 mL (has no administration in time range)  methylPREDNISolone sodium succinate (SOLU-MEDROL) 125 mg/2 mL injection 125 mg (has no administration in time range)    ED Course/ Medical Decision Making/ A&P   {   Click here for ABCD2, HEART and other calculatorsREFRESH Note before signing :1}                              Medical Decision Making Amount and/or Complexity of Data Reviewed Labs: ordered. Radiology: ordered.  Risk Prescription drug management.   This patient complains of ***; this involves an extensive number of treatment Options and is a complaint that carries with it a high risk of complications and morbidity. The differential includes ***  I ordered, reviewed and interpreted labs, which included *** I ordered medication *** and reviewed PMP when indicated. I ordered  imaging studies which included *** and I independently    visualized and interpreted imaging which showed *** Additional history obtained from *** Previous records obtained and reviewed *** I consulted *** and discussed lab and imaging findings and discussed disposition.  Cardiac monitoring reviewed, *** Social determinants considered, *** Critical Interventions: ***  After the interventions stated above, I reevaluated the patient and found *** Admission and further testing considered, ***   {Document critical care time when appropriate:1} {Document review of labs and clinical decision tools ie heart score, Chads2Vasc2 etc:1}  {Document your independent review of radiology images, and any outside records:1} {Document your discussion with family members, caretakers, and with consultants:1} {Document social determinants of health affecting pt's care:1} {  Document your decision making why or why not admission, treatments were needed:1} Final Clinical Impression(s) / ED Diagnoses Final diagnoses:  None    Rx / DC Orders ED Discharge Orders     None

## 2023-10-23 NOTE — ED Triage Notes (Signed)
 Pt sent from UC for SHOB/low oxygen levels. Pt states that she had a virus last week and was diagnosed with bronchitis. O2 was 89% on arrival.

## 2023-10-23 NOTE — ED Notes (Signed)
 Checked pt's pulse oximetry while ambulating, pt maintained SpO2 of 94-95% while ambulating, no complaints of SHOB.

## 2023-10-25 ENCOUNTER — Ambulatory Visit (HOSPITAL_COMMUNITY)
Admission: RE | Admit: 2023-10-25 | Discharge: 2023-10-25 | Disposition: A | Discharge status: Home / Self Care | Payer: Medicare Other | Source: Ambulatory Visit | Attending: Radiation Oncology | Admitting: Radiation Oncology

## 2023-10-25 DIAGNOSIS — I700 Atherosclerosis of aorta: Secondary | ICD-10-CM | POA: Diagnosis not present

## 2023-10-25 DIAGNOSIS — C3412 Malignant neoplasm of upper lobe, left bronchus or lung: Secondary | ICD-10-CM | POA: Diagnosis not present

## 2023-10-25 DIAGNOSIS — C3490 Malignant neoplasm of unspecified part of unspecified bronchus or lung: Secondary | ICD-10-CM | POA: Diagnosis not present

## 2023-10-25 DIAGNOSIS — J432 Centrilobular emphysema: Secondary | ICD-10-CM | POA: Diagnosis not present

## 2023-10-25 NOTE — Progress Notes (Signed)
 Renee Navarro is here today for follow up post radiation to the lung.  Lung Side: Left, patient completed treatment on 06/04/2020.   Does the patient complain of any of the following: Pain:Denies Shortness of breath w/wo exertion: She reports shortness of breath with exertion from PNA Cough: She reports coughing intermittently. Hemoptysis: Denies Pain with swallowing: Denies Swallowing/choking concerns: Denies Appetite: [employer] Level: Low Post radiation skin Changes: Denies    BP (!) 159/75 (BP Location: Left Arm, Patient Position: Sitting)   Pulse 77   Temp (!) 96.9 F (36.1 C) (Temporal)   Resp 18   Ht 5\' 4"  (1.626 m)   Wt 109 lb 4 oz (49.6 kg)   LMP  (LMP Unknown)   SpO2 94%   BMI 18.75 kg/m

## 2023-10-26 ENCOUNTER — Ambulatory Visit
Admission: RE | Admit: 2023-10-26 | Discharge: 2023-10-26 | Disposition: A | Discharge status: Home / Self Care | Payer: Self-pay | Source: Ambulatory Visit | Attending: Radiation Oncology | Admitting: Radiation Oncology

## 2023-10-26 ENCOUNTER — Encounter: Payer: Self-pay | Admitting: Radiation Oncology

## 2023-10-26 VITALS — BP 159/75 | HR 77 | Temp 96.9°F | Resp 18 | Ht 64.0 in | Wt 109.2 lb

## 2023-10-26 DIAGNOSIS — Z79899 Other long term (current) drug therapy: Secondary | ICD-10-CM | POA: Diagnosis not present

## 2023-10-26 DIAGNOSIS — I700 Atherosclerosis of aorta: Secondary | ICD-10-CM | POA: Insufficient documentation

## 2023-10-26 DIAGNOSIS — Z87891 Personal history of nicotine dependence: Secondary | ICD-10-CM | POA: Diagnosis not present

## 2023-10-26 DIAGNOSIS — J432 Centrilobular emphysema: Secondary | ICD-10-CM | POA: Diagnosis not present

## 2023-10-26 DIAGNOSIS — C3412 Malignant neoplasm of upper lobe, left bronchus or lung: Secondary | ICD-10-CM | POA: Diagnosis not present

## 2023-10-26 DIAGNOSIS — Z85118 Personal history of other malignant neoplasm of bronchus and lung: Secondary | ICD-10-CM | POA: Diagnosis not present

## 2023-10-26 DIAGNOSIS — Z923 Personal history of irradiation: Secondary | ICD-10-CM | POA: Insufficient documentation

## 2023-10-26 NOTE — Progress Notes (Signed)
 Radiation Oncology         [phone] ________________________________  Name: Renee Navarro MRN: [id number]  Date: 10/26/2023  DOB: 05-11-1946  Follow-Up Visit Note  CC: Renee Beams, MD  Renee Igo, DO    ICD-10-CM   1. Primary cancer of left upper lobe of lung (HCC)  C34.12 CT CHEST WO CONTRAST      Diagnosis: Squamous cell carcinoma of the left upper lobe, Clinical Stage IA1    Interval Since Last Radiation: 3 years, 4 months, and 24 days    Radiation Treatment Dates: 05/28/2020 through 06/04/2020 Site Technique Total Dose (Gy) Dose per Fx (Gy) Completed Fx Beam Energies  Lung, Left: Lung_Lt SBRT 54/54 18 3/3 6XFFF     Narrative:  The patient returns today for routine follow-up and to review most recent imaging. She was last seen in office on 04-26-23 for a routine follow up. Since then, she continued to follow up with her specialist to manage her chronic conditions.    She presented to the ED on 10-23-23 with complains of SOB and fatigue. DG chest done then showed bilateral lower lobe airspace opacities, right greater than left concerning for pneumonia. She was treated with antibiotics and prednisone.   Her most recent chest CT done on 10-25-23 showed no evidence of recurrent or metastatic disease. Scan did however reveal new peribronchovascular thickening and ground-glass in the lower lobes with minimal involvement of the right middle lobe with findings suggesting aspiration.               No other significant oncologic interval history since the patient was last seen.   Of note: -- DG bone density done on 05-21-23 revealed patient is osteoporotic according to World Health Organization [facility]) criteria.   --Bilateral screening Mammogram done on 05-21-23 showed no mammographic evidence of malignancy.       On evaluation today she denies any pain within the chest area significant cough or hemoptysis.  She continues to have some fatigue in light of her recent pneumonia.   She denies any use of supplemental oxygen.         Allergies:  is allergic to budesonide-formoterol fumarate and statins.  Meds: Current Outpatient Medications  Medication Sig Dispense Refill   albuterol (PROAIR HFA) 108 (90 Base) MCG/ACT inhaler Inhale 2 puffs into the lungs every 6 (six) hours as needed for shortness of breath. 8 g 3   amoxicillin-clavulanate (AUGMENTIN) 875-125 MG tablet Take 1 tablet by mouth every 12 (twelve) hours. 14 tablet 0   Calcium Citrate-Vitamin D 315-5 MG-MCG TABS Take 5 mcg by mouth 2 (two) times daily.     CALCIUM PO Take 650 mg by mouth daily.     denosumab (PROLIA) 60 MG/ML SOSY injection Inject 60 mg into the skin every 6 (six) months.     ezetimibe (ZETIA) 10 MG tablet Take 1 tablet (10 mg total) by mouth daily. 90 tablet 3   Fluticasone-Umeclidin-Vilant (TRELEGY ELLIPTA) 100-62.5-25 MCG/ACT AEPB Inhale 1 each into the lungs daily. 180 each 3   MAGNESIUM PO Take 200 mg by mouth 2 (two) times daily.     VITAMIN D PO Take 2,000 Int'l Units/day by mouth daily. Takes 4 tabs daily.     No current facility-administered medications for this encounter.    Physical Findings: The patient is in no acute distress. Patient is alert and oriented.  height is 5\' 4"  (1.626 m) and weight is 109 lb 4 oz (49.6 kg). Her temporal temperature  is 96.9 F (36.1 C) (abnormal). Her blood pressure is 159/75 (abnormal) and her pulse is 77. Her respiration is 18 and oxygen saturation is 94%. .  No significant changes. Lungs are clear to auscultation bilaterally. Heart has regular rate and rhythm. No palpable cervical, supraclavicular, or axillary adenopathy. Abdomen soft, non-tender, normal bowel sounds.   Lab Findings: Lab Results  Component Value Date   WBC 22.8 (H) 10/23/2023   HGB 13.4 10/23/2023   HCT 39.9 10/23/2023   MCV 91.3 10/23/2023   PLT 293 10/23/2023    Radiographic Findings: CT CHEST WO CONTRAST Result Date: 10/25/2023 CLINICAL DATA:  Non-small cell lung  cancer, nonmetastatic, assess treatment response. No worrisome lytic or sclerotic lesions. EXAM: CT CHEST WITHOUT CONTRAST TECHNIQUE: Multidetector CT imaging of the chest was performed following the standard protocol without IV contrast. RADIATION DOSE REDUCTION: This exam was performed according to the departmental dose-optimization program which includes automated exposure control, adjustment of the mA and/or kV according to patient size and/or use of iterative reconstruction technique. COMPARISON:  04/23/2023. FINDINGS: Cardiovascular: Atherosclerotic calcification of the aorta, aortic valve and coronary arteries. Heart is at the upper limits of normal in size. No pericardial effusion. Mediastinum/Nodes: No pathologically enlarged mediastinal lymph nodes. Hilar regions are difficult to definitively evaluate without IV contrast. Small lymph nodes and haziness in the left axilla. No pathologically enlarged axillary lymph nodes. Esophagus is grossly unremarkable. Lungs/Pleura: Biapical pleuroparenchymal scarring. Post radiation consolidation in the apical left upper lobe, with associated fiducial markers, unchanged. Centrilobular and paraseptal emphysema. New peribronchovascular consolidation and ground-glass in both lower lobes with minimal involvement of the right middle lobe. Subpleural scarring in the posterior right upper lobe. Calcified granulomas. No pleural fluid. Debris and right lower lobe bronchi. Airway is otherwise unremarkable. Upper Abdomen: Visualized portions of the liver, adrenal glands, kidneys, spleen, pancreas, stomach and bowel are grossly unremarkable. No upper abdominal adenopathy. Musculoskeletal: Degenerative changes in the spine. IMPRESSION: 1. Post treatment scarring in the left upper lobe. No evidence of recurrent or metastatic disease. 2. New peribronchovascular thickening and ground-glass in the lower lobes with minimal involvement of the right middle lobe. Findings may be due to  aspiration. 3. Aortic atherosclerosis (ICD10-I70.0). Coronary artery calcification. 4.  Emphysema (ICD10-J43.9). Electronically Signed   By: Leanna Battles M.D.   On: 10/25/2023 15:08   DG Chest Port 1 View Result Date: 10/23/2023 CLINICAL DATA:  Shortness of breath EXAM: PORTABLE CHEST 1 VIEW COMPARISON:  10/23/2023 (earlier today) FINDINGS: Heart and mediastinal contours are within normal limits. Bilateral lower lobe airspace opacities, right greater than left concerning for pneumonia. Biapical scarring. Underlying COPD. Heart and mediastinal contours within normal limits. No effusions. IMPRESSION: Bilateral lower lobe airspace opacities, right greater than left concerning for pneumonia. Findings similar to prior study. Electronically Signed   By: Charlett Nose M.D.   On: 10/23/2023 17:33    Impression:  Squamous cell carcinoma of the left upper lobe, Clinical Stage IA1   No evidence of recurrence on clinical exam today.  Recent chest CT scan favorable.  Plan: Routine follow-up in 6 months.  Prior to this follow-up appointment the patient will have a repeat CT scan of the chest.   30 minutes of total time was spent for this patient encounter, including preparation, face-to-face counseling with the patient and coordination of care, physical exam, and documentation of the encounter. ____________________________________  Billie Lade, PhD, MD  This document serves as a record of services personally performed by Antony Blackbird, MD. It  was created on his behalf by Herbie Saxon, a trained medical scribe. The creation of this record is based on the scribe's personal observations and the provider's statements to them. This document has been checked and approved by the attending provider.

## 2023-10-28 ENCOUNTER — Ambulatory Visit: Payer: Self-pay | Admitting: Radiation Oncology

## 2023-10-28 DIAGNOSIS — E878 Other disorders of electrolyte and fluid balance, not elsewhere classified: Secondary | ICD-10-CM | POA: Diagnosis not present

## 2023-10-28 DIAGNOSIS — J449 Chronic obstructive pulmonary disease, unspecified: Secondary | ICD-10-CM | POA: Diagnosis not present

## 2023-10-28 DIAGNOSIS — J159 Unspecified bacterial pneumonia: Secondary | ICD-10-CM | POA: Diagnosis not present

## 2023-10-28 LAB — CULTURE, BLOOD (ROUTINE X 2): Culture: NO GROWTH

## 2023-11-05 DIAGNOSIS — K219 Gastro-esophageal reflux disease without esophagitis: Secondary | ICD-10-CM | POA: Diagnosis not present

## 2023-11-26 ENCOUNTER — Ambulatory Visit (INDEPENDENT_AMBULATORY_CARE_PROVIDER_SITE_OTHER): Payer: Medicare Other

## 2023-11-26 ENCOUNTER — Encounter: Payer: Medicare Other | Admitting: Emergency Medicine

## 2023-11-29 ENCOUNTER — Other Ambulatory Visit (HOSPITAL_COMMUNITY): Payer: Self-pay | Admitting: Family Medicine

## 2023-11-29 ENCOUNTER — Ambulatory Visit (HOSPITAL_COMMUNITY)
Admission: RE | Admit: 2023-11-29 | Discharge: 2023-11-29 | Disposition: A | Discharge status: Home / Self Care | Source: Ambulatory Visit | Attending: Family Medicine | Admitting: Family Medicine

## 2023-11-29 DIAGNOSIS — J984 Other disorders of lung: Secondary | ICD-10-CM | POA: Diagnosis not present

## 2023-11-29 DIAGNOSIS — J159 Unspecified bacterial pneumonia: Secondary | ICD-10-CM | POA: Insufficient documentation

## 2023-12-06 ENCOUNTER — Telehealth: Payer: Self-pay | Admitting: *Deleted

## 2023-12-06 NOTE — Telephone Encounter (Signed)
 CALLED PATIENT TO INFORM OF CT FOR 05-01-24- ARRIVAL TIME- 9:45 AM @ [facility],. NO RESTRICTIONS TO SCAN, PATIENT TO RECEIVE RESULTS FROM DR. KINARD ON 05-04-24 @ 11:30 AM, SPOKE WITH PATIENT AND SHE IS AWARE OF THESE APPTS. AND THE INSTRUCTIONS

## 2023-12-10 ENCOUNTER — Encounter (INDEPENDENT_AMBULATORY_CARE_PROVIDER_SITE_OTHER): Payer: Self-pay | Admitting: Otolaryngology

## 2023-12-10 ENCOUNTER — Ambulatory Visit (INDEPENDENT_AMBULATORY_CARE_PROVIDER_SITE_OTHER): Payer: Medicare Other | Admitting: Otolaryngology

## 2023-12-10 VITALS — BP 112/66 | HR 70 | Ht 64.0 in | Wt 110.0 lb

## 2023-12-10 DIAGNOSIS — D485 Neoplasm of uncertain behavior of skin: Secondary | ICD-10-CM

## 2023-12-10 DIAGNOSIS — J349 Unspecified disorder of nose and nasal sinuses: Secondary | ICD-10-CM

## 2023-12-12 NOTE — Progress Notes (Signed)
 Patient ID: Renee Navarro, female   DOB: 01/23/1946, 78 y.o.   MRN: [id number]  Follow-up: Left nasal nodule  HPI: The patient is a 78 year old female who returns today for her follow-up evaluation.  The patient was last seen in February 2025.  At that time, she was noted to have a mobile 5 mm subcutaneous nodule within the left superior nostril.  The treatment options were discussed.  The decision at that time was to proceed with conservative observation.  The patient returns today reporting no change in her nasal lesion.  There is no change in size.  The area is nontender to touch.  She has no other complaint today.  Exam: General: Communicates without difficulty, well nourished, no acute distress. Head: Normocephalic, no evidence injury, no tenderness, facial buttresses intact without stepoff. Face/sinus: No tenderness to palpation and percussion. Facial movement is normal and symmetric. Eyes: PERRL, EOMI. No scleral icterus, conjunctivae clear. Neuro: CN II exam reveals vision grossly intact.  No nystagmus at any point of gaze. Ears: Auricles well formed without lesions.  Ear canals are intact without mass or lesion.  No erythema or edema is appreciated.  The TMs are intact without fluid. Nose: External evaluation reveals normal support and skin without lesions.  Dorsum is intact.  Anterior rhinoscopy reveals normal mucosa over anterior aspect of inferior turbinates and intact septum.  No purulence noted.  A mobile subcutaneous 5 mm nodule is noted within the left superior nostril.  No surface ulceration is noted.  Oral:  Oral cavity and oropharynx are intact, symmetric, without erythema or edema.  Mucosa is moist without lesions. Neck: Full range of motion without pain.  There is no significant lymphadenopathy.  No masses palpable.  Thyroid  bed within normal limits to palpation.  Parotid glands and submandibular glands equal bilaterally without mass.  Trachea is midline. Neuro:  CN 2-12 grossly intact.     Assessment: 1.  The patient has a mobile 5 mm subcutaneous nodule within the left superior nostril.  No surface ulceration or drainage is noted.  The patient is currently asymptomatic.  Plan: 1.  The physical exam findings are reviewed with the patient. 2.  The treatment options are again discussed. 3.  The patient would like to continue with conservative observation. 4.  The patient will return for reevaluation if she notices any change in characteristic of the nasal nodule.  She is encouraged to call with any questions or concerns.

## 2023-12-13 DIAGNOSIS — M810 Age-related osteoporosis without current pathological fracture: Secondary | ICD-10-CM | POA: Diagnosis not present

## 2024-01-05 DIAGNOSIS — H25043 Posterior subcapsular polar age-related cataract, bilateral: Secondary | ICD-10-CM | POA: Diagnosis not present

## 2024-01-05 DIAGNOSIS — H40013 Open angle with borderline findings, low risk, bilateral: Secondary | ICD-10-CM | POA: Diagnosis not present

## 2024-01-05 DIAGNOSIS — H2512 Age-related nuclear cataract, left eye: Secondary | ICD-10-CM | POA: Diagnosis not present

## 2024-01-05 DIAGNOSIS — H25013 Cortical age-related cataract, bilateral: Secondary | ICD-10-CM | POA: Diagnosis not present

## 2024-01-05 DIAGNOSIS — H18413 Arcus senilis, bilateral: Secondary | ICD-10-CM | POA: Diagnosis not present

## 2024-01-05 DIAGNOSIS — H2513 Age-related nuclear cataract, bilateral: Secondary | ICD-10-CM | POA: Diagnosis not present

## 2024-01-14 ENCOUNTER — Telehealth: Payer: Self-pay

## 2024-01-14 MED ORDER — TRELEGY ELLIPTA 100-62.5-25 MCG/ACT IN AEPB: 1.0000 | INHALATION_SPRAY | Freq: Every day | RESPIRATORY_TRACT | Status: AC

## 2024-01-14 NOTE — Telephone Encounter (Signed)
 Copied from CRM [id number]. Topic: Clinical - Medication Question >> Jan 14, 2024  9:21 AM Renee Navarro wrote: Reason for CRM:   Pt is requesting Trelegy samples if avail from the clinic. She reports having to pay $129 for a 90-day supply and this is difficult to afford.   Requests call back  CB#  [phone]  I called and spoke to pt. Pt states she would like samples of Trelegy 100 due to not being able to afford her RX as the cost is $129. I informed pt that I could provide her with 2 samples that she could pick up either before we close today at 2:30 or on Monday. Pt states she will be here Monday. I informed pt to make sure she keeps her upcoming appointment with Dr Renee Navarro as Dr Thelda Finney is no longer here. Pt verbalized understanding. NFN. Samples are up front.

## 2024-01-25 DIAGNOSIS — D1801 Hemangioma of skin and subcutaneous tissue: Secondary | ICD-10-CM | POA: Diagnosis not present

## 2024-01-25 DIAGNOSIS — L814 Other melanin hyperpigmentation: Secondary | ICD-10-CM | POA: Diagnosis not present

## 2024-01-25 DIAGNOSIS — L821 Other seborrheic keratosis: Secondary | ICD-10-CM | POA: Diagnosis not present

## 2024-02-21 ENCOUNTER — Telehealth: Payer: Self-pay | Admitting: *Deleted

## 2024-02-21 NOTE — Telephone Encounter (Signed)
 Returned patient's phone, spoke with patient

## 2024-03-01 ENCOUNTER — Other Ambulatory Visit: Payer: Self-pay

## 2024-03-01 ENCOUNTER — Telehealth: Payer: Self-pay

## 2024-03-01 MED ORDER — TRELEGY ELLIPTA 100-62.5-25 MCG/ACT IN AEPB: INHALATION_SPRAY | RESPIRATORY_TRACT | Status: DC

## 2024-03-01 NOTE — Telephone Encounter (Signed)
 Copied from CRM #[id number]. Topic: Clinical - Medication Question >> Feb 29, 2024  9:38 AM Corean SAUNDERS wrote: Reason for CRM: Patient is inquiring about Trelegy samples, please call patient back and advise. >> Mar 01, 2024  9:08 AM Isabell A wrote: Patient is requesting a call back in regard to samples for Trelegy.     Spoke w/ PT -Samples left up front   NFN-

## 2024-03-02 ENCOUNTER — Encounter: Admitting: Emergency Medicine

## 2024-03-13 ENCOUNTER — Other Ambulatory Visit: Payer: Self-pay | Admitting: Family Medicine

## 2024-03-13 DIAGNOSIS — Z1231 Encounter for screening mammogram for malignant neoplasm of breast: Secondary | ICD-10-CM

## 2024-03-17 DIAGNOSIS — H40023 Open angle with borderline findings, high risk, bilateral: Secondary | ICD-10-CM | POA: Diagnosis not present

## 2024-03-17 DIAGNOSIS — H25813 Combined forms of age-related cataract, bilateral: Secondary | ICD-10-CM | POA: Diagnosis not present

## 2024-04-07 ENCOUNTER — Ambulatory Visit: Admitting: Emergency Medicine

## 2024-04-07 ENCOUNTER — Encounter: Payer: Self-pay | Admitting: Emergency Medicine

## 2024-04-07 VITALS — BP 121/72 | HR 55 | Temp 97.1°F | Ht 64.0 in | Wt 111.0 lb

## 2024-04-07 DIAGNOSIS — C3412 Malignant neoplasm of upper lobe, left bronchus or lung: Secondary | ICD-10-CM

## 2024-04-07 DIAGNOSIS — J4489 Other specified chronic obstructive pulmonary disease: Secondary | ICD-10-CM | POA: Diagnosis not present

## 2024-04-07 DIAGNOSIS — Z87891 Personal history of nicotine dependence: Secondary | ICD-10-CM | POA: Diagnosis not present

## 2024-04-07 MED ORDER — TRELEGY ELLIPTA 100-62.5-25 MCG/ACT IN AEPB: 1.0000 | INHALATION_SPRAY | Freq: Every day | RESPIRATORY_TRACT | Status: AC

## 2024-04-07 MED ORDER — COVID-19 MRNA VAC-TRIS(PFIZER) 30 MCG/0.3ML IM SUSY: 0.3000 mL | PREFILLED_SYRINGE | Freq: Once | INTRAMUSCULAR | 0 refills | Status: AC

## 2024-04-07 NOTE — Assessment & Plan Note (Signed)
 She has been doing well overall.  She does think she gets some benefit from Trelegy although it is expensive.  We are going to try and supplement her with some samples today.  Plan to continue it for now.  Minimal albuterol  use and good functional capacity.  She has a URI currently without any evidence of a COPD exacerbation.  Did counsel her to call us  if she develops progressive symptoms so we can treat for a flare.  She is going to get her flu shot, COVID-19 vaccine (prescription given).  Pneumococcal vaccine is up-to-date.

## 2024-04-07 NOTE — Assessment & Plan Note (Addendum)
 Stable CT chest March 2025.  Next due in October, ordered.  She will follow this up with Dr. Shannon.  There was some new bibasilar linear inflammatory change versus scar.  We will do surveillance on this.

## 2024-04-07 NOTE — Patient Instructions (Signed)
 Get your surveillance CT scan of the chest in October as planned by Dr. Shannon. Continue Trelegy once daily.  Rinse and gargle after use. Keep your albuterol  available to use 2 puffs if needed for shortness of breath, chest tightness, wheezing. Please call our office if you develop progressive symptoms, persistent symptoms from your current upper respiratory infection.  We may need to treat you with additional medication if it causes your COPD to flareup. Get your flu shot this fall as planned Your pneumonia shot is up-to-date We will give your prescription so you can obtain the COVID-19 vaccine Follow with Dr. Shelah in 1 year.  Please call sooner if you have any problems

## 2024-04-07 NOTE — Progress Notes (Signed)
 Subjective:    Patient ID: Renee Navarro, female    DOB: 03-Apr-1946, 78 y.o.   MRN: [id number]  HPI Renee Navarro is a 78 year old former smoker (40 pack years) with a history of COPD, left upper lobe non-small cell (squamous cell) lung cancer that was treated by SBRT.  Surveillance imaging has been reassuring, last scan done October 25, 2023.  Past medical history also significant for hyperlipidemia right bundle branch block, prior pneumonias. Currently managed on Trelegy.  She reports today that she is feeling well - she may be coming down w a URI. She had an AE-COPD in setting prior URI in May. Some hoarseness but no other sx. She believes that the Trelegy helps her. She is able to be active. No cough, rare mucous.   CT chest 10/25/2023 reviewed by me shows some posttreatment scarring in the left upper lobe without any evidence of recurrent or metastatic disease.  There is some peribronchovascular thickening and bilateral lower lobe inflammatory change, ground glass.  Also some scattered calcified granulomas   Review of Systems As per HPI  Past Medical History:  Diagnosis Date   Abnormal EKG 09/20/2018   Chronic bronchitis (HCC) 10/03/2014   COPD (chronic obstructive pulmonary disease) (HCC)    Dyslipidemia    diet controlled   Dyspnea    with exertion   History of radiation therapy 05/28/2020-06/04/2020   3 SBRT treatments to left lung; Dr Lynwood Nasuti   Lung nodule 02/29/2012   3/24/2014CT Chest: no change in RUL lung nodule, likely benign. No change since 2008  No further CTs needed    Lung nodule seen on imaging study    Obstructive chronic bronchitis without exacerbation gold stage A COPD 11/17/2007   Osteoporosis    Pneumonia    x 1   Pneumonia    x 1   Primary cancer of left upper lobe of lung (HCC) 05/09/2020   RBBB (right bundle branch block with left anterior fascicular block) 09/22/2018   Tobacco user    quit 06/2012     Family History  Problem Relation Age of Onset    Colon cancer Father    Diabetes Sister    Hypertension Sister    Lymphoma Brother      Social History   Socioeconomic History   Marital status: Married    Spouse name: Not on file   Number of children: Not on file   Years of education: Not on file   Highest education level: Not on file  Occupational History   Occupation: TEACHER    Employer: [employer]    Comment: [employer]  Tobacco Use   Smoking status: Former    Current packs/day: 0.00    Average packs/day: 0.8 packs/day for 51.0 years (38.3 ttl pk-yrs)    Types: Cigarettes    Start date: 07/14/1961    Quit date: 07/14/2012    Years since quitting: 11.7    Passive exposure: Past   Smokeless tobacco: Never  Vaping Use   Vaping status: Never Used  Substance and Sexual Activity   Alcohol use: Yes    Comment: occ glass of wine   Drug use: No   Sexual activity: Not Currently    Birth control/protection: Post-menopausal  Other Topics Concern   Not on file  Social History Narrative   Caffeine Use: occasional   Regular exercise:  No         Social Drivers of Corporate investment banker Strain: Not  on file  Food Insecurity: Not on file  Transportation Needs: Not on file  Physical Activity: Not on file  Stress: Not on file  Social Connections: Not on file  Intimate Partner Violence: Not on file     Allergies  Allergen Reactions   Budesonide-Formoterol Fumarate Cough   Statins Other (See Comments)    Trouble urinating     Outpatient Medications Prior to Visit  Medication Sig Dispense Refill   albuterol  (PROAIR  HFA) 108 (90 Base) MCG/ACT inhaler Inhale 2 puffs into the lungs every 6 (six) hours as needed for shortness of breath. 8 g 3   Calcium  Citrate-Vitamin D  315-5 MG-MCG TABS Take 5 mcg by mouth 2 (two) times daily.     CALCIUM  PO Take 650 mg by mouth daily.     denosumab  (PROLIA ) 60 MG/ML SOSY injection Inject 60 mg into the skin every 6 (six) months.     ezetimibe  (ZETIA ) 10 MG tablet Take 1  tablet (10 mg total) by mouth daily. 90 tablet 3   Fluticasone -Umeclidin-Vilant (TRELEGY ELLIPTA ) 100-62.5-25 MCG/ACT AEPB Inhale 1 puff into the lungs daily.     MAGNESIUM PO Take 200 mg by mouth 2 (two) times daily.     VITAMIN D  PO Take 2,000 Int'l Units/day by mouth daily. Takes 4 tabs daily.     amoxicillin -clavulanate (AUGMENTIN ) 875-125 MG tablet Take 1 tablet by mouth every 12 (twelve) hours. (Patient not taking: Reported on 12/10/2023) 14 tablet 0   Fluticasone -Umeclidin-Vilant (TRELEGY ELLIPTA ) 100-62.5-25 MCG/ACT AEPB Inhale 1 each into the lungs daily. 180 each 3   TRELEGY ELLIPTA  100-62.5-25 MCG/ACT AEPB 2 samples     No facility-administered medications prior to visit.        Objective:   Physical Exam Vitals:   04/07/24 0845  BP: 121/72  Pulse: (!) 55  Temp: (!) 97.1 F (36.2 C)  TempSrc: Oral  SpO2: 98%  Weight: 111 lb (50.3 kg)  Height: 5' 4 (1.626 m)   Gen: Pleasant, thin woman, well appearing, in no distress,  normal affect  ENT: No lesions,  mouth clear,  oropharynx clear, no postnasal drip  Neck: No JVD, no stridor  Lungs: No use of accessory muscles, distant but no crackles or wheezing on normal respiration, no wheeze on forced expiration  Cardiovascular: RRR, heart sounds normal, no murmur or gallops, no peripheral edema  Musculoskeletal: No deformities, no cyanosis or clubbing  Neuro: alert, awake, non focal  Skin: Warm, no lesions or rash       Assessment & Plan:   Obstructive chronic bronchitis without exacerbation gold stage A COPD She has been doing well overall.  She does think she gets some benefit from Trelegy although it is expensive.  We are going to try and supplement her with some samples today.  Plan to continue it for now.  Minimal albuterol  use and good functional capacity.  She has a URI currently without any evidence of a COPD exacerbation.  Did counsel her to call us  if she develops progressive symptoms so we can treat for a  flare.  She is going to get her flu shot, COVID-19 vaccine (prescription given).  Pneumococcal vaccine is up-to-date.  Primary cancer of left upper lobe of lung (HCC) Stable CT chest March 2025.  Next due in October, ordered.  She will follow this up with Dr. Shannon.  There was some new bibasilar linear inflammatory change versus scar.  We will do surveillance on this.    Lamar Chris, MD, PhD 04/07/2024,  9:25 AM [facility] [phone] or if no answer before 7:00PM call [phone] For any issues after 7:00PM please call eLink [phone]

## 2024-04-14 DIAGNOSIS — Z23 Encounter for immunization: Secondary | ICD-10-CM | POA: Diagnosis not present

## 2024-04-26 ENCOUNTER — Ambulatory Visit (HOSPITAL_COMMUNITY)

## 2024-05-01 ENCOUNTER — Telehealth: Payer: Self-pay | Admitting: *Deleted

## 2024-05-01 ENCOUNTER — Ambulatory Visit (HOSPITAL_COMMUNITY)
Admission: RE | Admit: 2024-05-01 | Discharge: 2024-05-01 | Disposition: A | Discharge status: Home / Self Care | Source: Ambulatory Visit | Attending: Radiation Oncology | Admitting: Radiation Oncology

## 2024-05-01 DIAGNOSIS — C3490 Malignant neoplasm of unspecified part of unspecified bronchus or lung: Secondary | ICD-10-CM | POA: Diagnosis not present

## 2024-05-01 DIAGNOSIS — C3412 Malignant neoplasm of upper lobe, left bronchus or lung: Secondary | ICD-10-CM | POA: Insufficient documentation

## 2024-05-01 DIAGNOSIS — J432 Centrilobular emphysema: Secondary | ICD-10-CM | POA: Diagnosis not present

## 2024-05-01 DIAGNOSIS — I700 Atherosclerosis of aorta: Secondary | ICD-10-CM | POA: Diagnosis not present

## 2024-05-01 NOTE — Telephone Encounter (Signed)
 RETURNED PATIENT'S PHONE CALL, SPOKE WITH PATIENT. ?

## 2024-05-02 NOTE — Progress Notes (Signed)
 Radiation Oncology         [phone] ________________________________  Name: Renee Navarro MRN: [id number]  Date: 05/04/2024  DOB: 20-Jul-1946  Follow-Up Visit Note  CC: [name], MD  [name], DO  No diagnosis found.  Diagnosis:   Squamous cell carcinoma of the left upper lobe, Clinical Stage IA1    Interval Since Last Radiation: 3 years, 11 months, 1 day   Radiation Treatment Dates: 05/28/2020 through 06/04/2020 Site Technique Total Dose (Gy) Dose per Fx (Gy) Completed Fx Beam Energies  Lung, Left: Lung_Lt SBRT 54/54 18 3/3 6XFFF      Narrative:  The patient returns today for routine follow-up and to review most recent imaging. She was last seen in office on 10/26/23 for a routine follow up. Since then, she continued to follow up with her specialist to manage her chronic conditions.  In the interval since she was last seen, she presented for a follow up visit with Dr. Shelah on 04/07/24 during which she reported feeling well overall and continues to use Trelegy with noticeable improvements.   Most recent CT chest performed on 05/01/24 shows new heterogeneous opacity in the inferior lingula with associated air bronchogram and patchy ground-glass opacities, favoring pneumonia. Scan also noted stable appearance of peripheral linear opacity in the left lung apex which exhibits a small fiducial marker with no new or suspicious lung nodule.  No other significant oncologic interval history since the patient was last seen.    Allergies:  is allergic to budesonide-formoterol fumarate and statins.  Meds: Current Outpatient Medications  Medication Sig Dispense Refill   albuterol  (PROAIR  HFA) 108 (90 Base) MCG/ACT inhaler Inhale 2 puffs into the lungs every 6 (six) hours as needed for shortness of breath. 8 g 3   amoxicillin -clavulanate (AUGMENTIN ) 875-125 MG tablet Take 1 tablet by mouth every 12 (twelve) hours. (Patient not taking: Reported on 12/10/2023) 14 tablet 0    Calcium  Citrate-Vitamin D  315-5 MG-MCG TABS Take 5 mcg by mouth 2 (two) times daily.     CALCIUM  PO Take 650 mg by mouth daily.     denosumab  (PROLIA ) 60 MG/ML SOSY injection Inject 60 mg into the skin every 6 (six) months.     ezetimibe  (ZETIA ) 10 MG tablet Take 1 tablet (10 mg total) by mouth daily. 90 tablet 3   Fluticasone -Umeclidin-Vilant (TRELEGY ELLIPTA ) 100-62.5-25 MCG/ACT AEPB Inhale 1 each into the lungs daily. 180 each 3   Fluticasone -Umeclidin-Vilant (TRELEGY ELLIPTA ) 100-62.5-25 MCG/ACT AEPB Inhale 1 puff into the lungs daily.     Fluticasone -Umeclidin-Vilant (TRELEGY ELLIPTA ) 100-62.5-25 MCG/ACT AEPB Inhale 1 puff into the lungs daily.     MAGNESIUM PO Take 200 mg by mouth 2 (two) times daily.     TRELEGY ELLIPTA  100-62.5-25 MCG/ACT AEPB 2 samples     VITAMIN D  PO Take 2,000 Int'l Units/day by mouth daily. Takes 4 tabs daily.     No current facility-administered medications for this visit.    Physical Findings: The patient is in no acute distress. Patient is alert and oriented.  vitals were not taken for this visit. .  No significant changes. Lungs are clear to auscultation bilaterally. Heart has regular rate and rhythm. No palpable cervical, supraclavicular, or axillary adenopathy. Abdomen soft, non-tender, normal bowel sounds.   Lab Findings: Lab Results  Component Value Date   WBC 22.8 (H) 10/23/2023   HGB 13.4 10/23/2023   HCT 39.9 10/23/2023   MCV 91.3 10/23/2023   PLT 293 10/23/2023    Radiographic  Findings: No results found.  Impression:   Squamous cell carcinoma of the left upper lobe, Clinical Stage IA1   The patient is recovering from the effects of radiation.  ***  Plan:  ***   *** minutes of total time was spent for this patient encounter, including preparation, face-to-face counseling with the patient and coordination of care, physical exam, and documentation of the encounter. ____________________________________  Lynwood CHARM Nasuti, PhD, MD  This  document serves as a record of services personally performed by Lynwood Nasuti, MD. It was created on his behalf by Reymundo Cartwright, a trained medical scribe. The creation of this record is based on the scribe's personal observations and the provider's statements to them. This document has been checked and approved by the attending provider.

## 2024-05-04 ENCOUNTER — Encounter: Payer: Self-pay | Admitting: Radiation Oncology

## 2024-05-04 ENCOUNTER — Ambulatory Visit
Admission: RE | Admit: 2024-05-04 | Discharge: 2024-05-04 | Disposition: A | Discharge status: Home / Self Care | Payer: Self-pay | Source: Ambulatory Visit | Attending: Radiation Oncology | Admitting: Radiation Oncology

## 2024-05-04 VITALS — BP 141/80 | HR 64 | Temp 97.1°F | Resp 18 | Ht 64.0 in | Wt 114.2 lb

## 2024-05-04 DIAGNOSIS — Z923 Personal history of irradiation: Secondary | ICD-10-CM | POA: Insufficient documentation

## 2024-05-04 DIAGNOSIS — C3412 Malignant neoplasm of upper lobe, left bronchus or lung: Secondary | ICD-10-CM | POA: Diagnosis not present

## 2024-05-04 DIAGNOSIS — Z79899 Other long term (current) drug therapy: Secondary | ICD-10-CM | POA: Insufficient documentation

## 2024-05-04 DIAGNOSIS — I700 Atherosclerosis of aorta: Secondary | ICD-10-CM | POA: Diagnosis not present

## 2024-05-04 DIAGNOSIS — Z87891 Personal history of nicotine dependence: Secondary | ICD-10-CM | POA: Diagnosis not present

## 2024-05-04 DIAGNOSIS — I2510 Atherosclerotic heart disease of native coronary artery without angina pectoris: Secondary | ICD-10-CM | POA: Diagnosis not present

## 2024-05-04 DIAGNOSIS — Z85118 Personal history of other malignant neoplasm of bronchus and lung: Secondary | ICD-10-CM | POA: Insufficient documentation

## 2024-05-04 DIAGNOSIS — J432 Centrilobular emphysema: Secondary | ICD-10-CM | POA: Insufficient documentation

## 2024-05-04 NOTE — Progress Notes (Signed)
 Renee Navarro is here today for follow up post radiation to the lung.  Lung Side: Left, patient completed treatment on 06/04/2020  Does the patient complain of any of the following: Pain:No Shortness of breath w/wo exertion: Yes, mostly on exertion.  Cough: No Hemoptysis: No Pain with swallowing: No Swallowing/choking concerns: No Appetite: Good  Energy Level: Good  Post radiation skin Changes: No    Additional comments if applicable:   BP (!) 141/80 (BP Location: Left Arm, Patient Position: Sitting)   Pulse 64   Temp (!) 97.1 F (36.2 C) (Temporal)   Resp 18   Ht 5' 4 (1.626 m)   Wt 114 lb 4 oz (51.8 kg)   LMP  (LMP Unknown)   SpO2 99%   BMI 19.61 kg/m

## 2024-05-16 ENCOUNTER — Telehealth: Payer: Self-pay

## 2024-05-16 NOTE — Telephone Encounter (Signed)
 Copied from CRM #[id number]. Topic: General - Other >> May 16, 2024 12:54 PM Russell PARAS wrote: Reason for CRM:   Pt was contacting clinic to schedule appt with Byrum due to new symptoms.   She recently had a CT scan of chest that was normal, aside from early signs of pneumonia. Was not prescribed antibiotic due to being asymptomatic. Reports she has been having more SOB in the past month  Offered NT but pt did not have time as she was heading to school in 15 mins and would be busy the rest of the afternoon. Offered to have NT call her; however, she refused and said she would call back tomm.   CB#  [phone]    Informed DOD he stated when she calls back she can make an appointment.  -NFN

## 2024-05-17 NOTE — Telephone Encounter (Signed)
 Copied from CRM [id number]. Topic: Appointments - Appointment Scheduling >> May 17, 2024  3:49 PM Essie A wrote: Patient/patient representative is calling to schedule an appointment. Refer to attachments for appointment information.  Patient is complaining of shortness of breath but didn't want to go to NT, only wanted an appointment  Has appointment with byrum on the 29th NFN

## 2024-05-22 ENCOUNTER — Telehealth: Payer: Self-pay | Admitting: *Deleted

## 2024-05-22 NOTE — Telephone Encounter (Signed)
 CALLED PATIENT TO INFORM OF CT FOR 08-04-24- ARRIVAL TIME- 9:45 AM @ [facility], NO RESTRICTIONS TO SCAN, PATIENT TO RECEIVE RESULTS FROM DR. KINARD ON 08-07-24 @ 11 AM, SPOKE WITH PATIENT AND SHE IS AWARE OF THESE APPTS. AND THE INSTRUCTIONS

## 2024-05-24 ENCOUNTER — Encounter: Payer: Self-pay | Admitting: Emergency Medicine

## 2024-05-24 ENCOUNTER — Ambulatory Visit: Admitting: Emergency Medicine

## 2024-05-24 VITALS — BP 130/72 | HR 65 | Ht 64.0 in | Wt 108.9 lb

## 2024-05-24 DIAGNOSIS — J449 Chronic obstructive pulmonary disease, unspecified: Secondary | ICD-10-CM | POA: Diagnosis not present

## 2024-05-24 DIAGNOSIS — C3412 Malignant neoplasm of upper lobe, left bronchus or lung: Secondary | ICD-10-CM

## 2024-05-24 MED ORDER — TRELEGY ELLIPTA 100-62.5-25 MCG/ACT IN AEPB: INHALATION_SPRAY | RESPIRATORY_TRACT | Status: AC

## 2024-05-24 NOTE — Patient Instructions (Addendum)
 Please continue your Trelegy once daily as you have been taking it.  Rinse and gargle after using. Keep your albuterol  available to use 2 puffs if needed for shortness of breath, chest tightness, wheezing. Your flu and COVID vaccines are both up-to-date We reviewed your CT scan of the chest today.  There is some evidence for scattered inflammation but nothing to suggest recurrence of lung cancer.  Good news.  Get your repeat CT chest in January as planned by radiation oncology. Follow with Dr. Shelah in 6 months, sooner if any problems.

## 2024-05-24 NOTE — Assessment & Plan Note (Signed)
 There is an area of new inflammatory change in the lingula, resolution of some previously noted inflammatory change.  No evidence of recurrent malignancy.  She is going to have a repeat CT in 3 months to follow inflammation and for resolution.  Follow-up with radiation oncology.  If increase suspicion based on the next CT then we can discuss, determine whether diagnostic biopsy is indicated.

## 2024-05-24 NOTE — Assessment & Plan Note (Signed)
 Overall doing okay.  She does have exposures to URIs through work.  She may have a slightly higher mucus burden currently but no evidence for an overt COPD exacerbation.  She wonders if she is truly benefiting from the Trelegy and is interested in possibly taking a break from it.  I think this would be okay but I do not want her to do it there in the cold/flu season.  We will wait until the spring.  Her flu and COVID vaccines are up-to-date.  Please continue your Trelegy once daily as you have been taking it.  Rinse and gargle after using. Keep your albuterol  available to use 2 puffs if needed for shortness of breath, chest tightness, wheezing. Your flu and COVID vaccines are both up-to-date Follow with Dr. Shelah in 6 months, sooner if any problems.

## 2024-05-24 NOTE — Progress Notes (Signed)
 Subjective:    Patient ID: Renee Navarro, female    DOB: [birth year], 78 y.o.   MRN: [id number]  HPI  ROV 05/24/2024 --78 year old woman with a history of COPD, squamous cell lung cancer of the left upper lobe that was treated by SBRT, hyperlipidemia, right bundle branch block, multiple prior pneumonias. Managed on Trelegy - she is interested in taking a break from it at some point in the future (not during flu season) to see if she misses it.  She reports that she has been doing ok - is able to exert, works with children, walks daily. She has had some cough and mucous for about 1 week - clear mucous. She uses her albuterol  a few times a week. She wonders whether she may be coming down w URI - has been exposed to kids.  She has had the flu and covid shots.   CT chest 05/01/2024 reviewed by me, shows stable peripheral linear opacity in the left apex with fiducial marker.  Previously noted opacities in the bilateral lower lobes and middle lobe have resolved.  Since most recent study there is new heterogeneous opacity in the inferior lingula with some air bronchograms in past she ground glass.   Review of Systems As per HPI  Past Medical History:  Diagnosis Date   Abnormal EKG 09/20/2018   Chronic bronchitis (HCC) 10/03/2014   COPD (chronic obstructive pulmonary disease) (HCC)    Dyslipidemia    diet controlled   Dyspnea    with exertion   History of radiation therapy 05/28/2020-06/04/2020   3 SBRT treatments to left lung; Dr Lynwood Nasuti   Lung nodule 02/29/2012   3/24/2014CT Chest: no change in RUL lung nodule, likely benign. No change since 2008  No further CTs needed    Lung nodule seen on imaging study    Obstructive chronic bronchitis without exacerbation gold stage A COPD 11/17/2007   Osteoporosis    Pneumonia    x 1   Pneumonia    x 1   Primary cancer of left upper lobe of lung (HCC) 05/09/2020   RBBB (right bundle branch block with left anterior fascicular block) 09/22/2018    Tobacco user    quit 06/2012     Family History  Problem Relation Age of Onset   Colon cancer Father    Diabetes Sister    Hypertension Sister    Lymphoma Brother      Social History   Socioeconomic History   Marital status: Married    Spouse name: Not on file   Number of children: Not on file   Years of education: Not on file   Highest education level: Not on file  Occupational History   Occupation: TEACHER    Employer: [employer]    Comment: [employer]  Tobacco Use   Smoking status: Former    Current packs/day: 0.00    Average packs/day: 0.8 packs/day for 51.0 years (38.3 ttl pk-yrs)    Types: Cigarettes    Start date: 07/14/1961    Quit date: 07/14/2012    Years since quitting: 11.8    Passive exposure: Past   Smokeless tobacco: Never  Vaping Use   Vaping status: Never Used  Substance and Sexual Activity   Alcohol use: Yes    Comment: occ glass of wine   Drug use: No   Sexual activity: Not Currently    Birth control/protection: Post-menopausal  Other Topics Concern   Not on file  Social History  Narrative   Caffeine Use: occasional   Regular exercise:  No         Social Drivers of Corporate Investment Banker Strain: Not on file  Food Insecurity: Not on file  Transportation Needs: Not on file  Physical Activity: Not on file  Stress: Not on file  Social Connections: Not on file  Intimate Partner Violence: Not on file     Allergies  Allergen Reactions   Budesonide-Formoterol Fumarate Cough   Statins Other (See Comments)    Trouble urinating     Outpatient Medications Prior to Visit  Medication Sig Dispense Refill   albuterol  (PROAIR  HFA) 108 (90 Base) MCG/ACT inhaler Inhale 2 puffs into the lungs every 6 (six) hours as needed for shortness of breath. 8 g 3   calcium  carbonate (SUPER CALCIUM ) 1500 (600 Ca) MG TABS tablet Take 600 mg of elemental calcium  by mouth daily with breakfast.     Calcium  Citrate-Vitamin D  315-5 MG-MCG TABS Take 5  mcg by mouth 2 (two) times daily.     CALCIUM  PO Take 650 mg by mouth daily.     denosumab  (PROLIA ) 60 MG/ML SOSY injection Inject 60 mg into the skin every 6 (six) months.     ezetimibe  (ZETIA ) 10 MG tablet Take 1 tablet (10 mg total) by mouth daily. 90 tablet 3   Fluticasone -Umeclidin-Vilant (TRELEGY ELLIPTA ) 100-62.5-25 MCG/ACT AEPB Inhale 1 each into the lungs daily. 180 each 3   MAGNESIUM PO Take 200 mg by mouth 2 (two) times daily.     TRELEGY ELLIPTA  100-62.5-25 MCG/ACT AEPB 2 samples     VITAMIN D  PO Take 2,000 Int'l Units/day by mouth daily. Takes 4 tabs daily.     amoxicillin -clavulanate (AUGMENTIN ) 875-125 MG tablet Take 1 tablet by mouth every 12 (twelve) hours. (Patient not taking: Reported on 05/24/2024) 14 tablet 0   Fluticasone -Umeclidin-Vilant (TRELEGY ELLIPTA ) 100-62.5-25 MCG/ACT AEPB Inhale 1 puff into the lungs daily. (Patient not taking: Reported on 05/24/2024)     Fluticasone -Umeclidin-Vilant (TRELEGY ELLIPTA ) 100-62.5-25 MCG/ACT AEPB Inhale 1 puff into the lungs daily. (Patient not taking: Reported on 05/24/2024)     No facility-administered medications prior to visit.        Objective:   Physical Exam Vitals:   05/24/24 1606  BP: 130/72  Pulse: 65  SpO2: 99%  Weight: 108 lb 14.4 oz (49.4 kg)  Height: 5' 4 (1.626 m)    Gen: Pleasant, thin woman, well appearing, in no distress,  normal affect  ENT: No lesions,  mouth clear,  oropharynx clear, no postnasal drip  Neck: No JVD, no stridor  Lungs: No use of accessory muscles, distant but no crackles or wheezing on normal respiration, no wheeze on forced expiration  Cardiovascular: RRR, heart sounds normal, no murmur or gallops, no peripheral edema  Musculoskeletal: No deformities, no cyanosis or clubbing  Neuro: alert, awake, non focal  Skin: Warm, no lesions or rash       Assessment & Plan:   COPD (chronic obstructive pulmonary disease) (HCC) Overall doing okay.  She does have exposures to URIs  through work.  She may have a slightly higher mucus burden currently but no evidence for an overt COPD exacerbation.  She wonders if she is truly benefiting from the Trelegy and is interested in possibly taking a break from it.  I think this would be okay but I do not want her to do it there in the cold/flu season.  We will wait until the spring.  Her  flu and COVID vaccines are up-to-date.  Please continue your Trelegy once daily as you have been taking it.  Rinse and gargle after using. Keep your albuterol  available to use 2 puffs if needed for shortness of breath, chest tightness, wheezing. Your flu and COVID vaccines are both up-to-date Follow with Dr. Shelah in 6 months, sooner if any problems.  Primary cancer of left upper lobe of lung (HCC) There is an area of new inflammatory change in the lingula, resolution of some previously noted inflammatory change.  No evidence of recurrent malignancy.  She is going to have a repeat CT in 3 months to follow inflammation and for resolution.  Follow-up with radiation oncology.  If increase suspicion based on the next CT then we can discuss, determine whether diagnostic biopsy is indicated.   I personally spent a total of 35 minutes in the care of the patient today including preparing to see the patient, getting/reviewing separately obtained history, performing a medically appropriate exam/evaluation, counseling and educating, documenting clinical information in the EHR, and independently interpreting results.   Lamar Shelah, MD, PhD 05/24/2024, 4:36 PM [facility] [phone] or if no answer before 7:00PM call [phone] For any issues after 7:00PM please call eLink [phone]

## 2024-05-26 ENCOUNTER — Ambulatory Visit

## 2024-06-01 ENCOUNTER — Telehealth (HOSPITAL_BASED_OUTPATIENT_CLINIC_OR_DEPARTMENT_OTHER): Payer: Self-pay

## 2024-06-01 DIAGNOSIS — K5904 Chronic idiopathic constipation: Secondary | ICD-10-CM | POA: Diagnosis not present

## 2024-06-01 DIAGNOSIS — Z860100 Personal history of colon polyps, unspecified: Secondary | ICD-10-CM | POA: Diagnosis not present

## 2024-06-01 DIAGNOSIS — Z800 Family history of malignant neoplasm of digestive organs: Secondary | ICD-10-CM | POA: Diagnosis not present

## 2024-06-01 DIAGNOSIS — Z1211 Encounter for screening for malignant neoplasm of colon: Secondary | ICD-10-CM | POA: Diagnosis not present

## 2024-06-01 NOTE — Telephone Encounter (Signed)
   Pre-operative Risk Assessment    Patient Name: Renee Navarro  DOB: [birth year] MRN: [id number]   Date of last office visit: 09/17/23 with Dr. Lonni Date of next office visit: NA   Request for Surgical Clearance    Procedure:  colonoscopy  Date of Surgery:  Clearance 06/30/24                                 Surgeon:  Dr. Kristie Socks Group or Practice Name:  [facility] Phone number:  [phone] Fax number:  [phone]   Type of Clearance Requested:   - Medical    Type of Anesthesia:  propofol    Additional requests/questions:    SignedAugustin JONETTA Daring   06/01/2024, 2:48 PM

## 2024-06-02 ENCOUNTER — Ambulatory Visit
Admission: RE | Admit: 2024-06-02 | Discharge: 2024-06-02 | Disposition: A | Discharge status: Home / Self Care | Source: Ambulatory Visit | Attending: Family Medicine | Admitting: Family Medicine

## 2024-06-02 ENCOUNTER — Telehealth (HOSPITAL_BASED_OUTPATIENT_CLINIC_OR_DEPARTMENT_OTHER): Payer: Self-pay | Admitting: *Deleted

## 2024-06-02 DIAGNOSIS — Z1231 Encounter for screening mammogram for malignant neoplasm of breast: Secondary | ICD-10-CM

## 2024-06-02 NOTE — Telephone Encounter (Signed)
 Left message to call back to schedule a tele pre op appt.  ?

## 2024-06-02 NOTE — Telephone Encounter (Signed)
   Name: Renee Navarro  DOB: Mar 22, 1946  MRN: [id number]  Primary Cardiologist: Shelda Bruckner, MD   Preoperative team, please contact this patient and set up a phone call appointment for further preoperative risk assessment. Please obtain consent and complete medication review. Thank you for your help. Last seen on 09/17/2023.  I confirm that guidance regarding antiplatelet and oral anticoagulation therapy has been completed and, if necessary, noted below.  Patient is not on anticoagulation or antiplatelet per review of current medical record in Epic.    I also confirmed the patient resides in the state of [place] . As per [facility] Medical Board telemedicine laws, the patient must reside in the state in which the provider is licensed.   Lamarr Satterfield, NP 06/02/2024, 8:10 AM [facility]

## 2024-06-02 NOTE — Telephone Encounter (Signed)
 Pt has been scheduled tele 07/14/24. Pt tells me she is moving her procedure out to Jan 2026 as her daughter as a pt the same day she scheduled her procedure and she is going to be with her daughter.   Med rec and consent are done.

## 2024-06-02 NOTE — Telephone Encounter (Signed)
 Pt has been scheduled tele 07/14/24. Pt tells me she is moving her procedure out to Jan 2026 as her daughter as a pt the same day she scheduled her procedure and she is going to be with her daughter.   Med rec and consent are done.     Patient Consent for Virtual Visit        Renee Navarro has provided verbal consent on 06/02/2024 for a virtual visit (video or telephone).   CONSENT FOR VIRTUAL VISIT FOR:  Renee Navarro  By participating in this virtual visit I agree to the following:  I hereby voluntarily request, consent and authorize [facility] and its employed or contracted physicians, physician assistants, nurse practitioners or other licensed health care professionals (the Practitioner), to provide me with telemedicine health care services (the "Services) as deemed necessary by the treating Practitioner. I acknowledge and consent to receive the Services by the Practitioner via telemedicine. I understand that the telemedicine visit will involve communicating with the Practitioner through live audiovisual communication technology and the disclosure of certain medical information by electronic transmission. I acknowledge that I have been given the opportunity to request an in-person assessment or other available alternative prior to the telemedicine visit and am voluntarily participating in the telemedicine visit.  I understand that I have the right to withhold or withdraw my consent to the use of telemedicine in the course of my care at any time, without affecting my right to future care or treatment, and that the Practitioner or I may terminate the telemedicine visit at any time. I understand that I have the right to inspect all information obtained and/or recorded in the course of the telemedicine visit and may receive copies of available information for a reasonable fee.  I understand that some of the potential risks of receiving the Services via telemedicine include:  Delay or  interruption in medical evaluation due to technological equipment failure or disruption; Information transmitted may not be sufficient (e.g. poor resolution of images) to allow for appropriate medical decision making by the Practitioner; and/or  In rare instances, security protocols could fail, causing a breach of personal health information.  Furthermore, I acknowledge that it is my responsibility to provide information about my medical history, conditions and care that is complete and accurate to the best of my ability. I acknowledge that Practitioner's advice, recommendations, and/or decision may be based on factors not within their control, such as incomplete or inaccurate data provided by me or distortions of diagnostic images or specimens that may result from electronic transmissions. I understand that the practice of medicine is not an exact science and that Practitioner makes no warranties or guarantees regarding treatment outcomes. I acknowledge that a copy of this consent can be made available to me via my patient portal [facility] MyChart), or I can request a printed copy by calling the office of [facility].    I understand that my insurance will be billed for this visit.   I have read or had this consent read to me. I understand the contents of this consent, which adequately explains the benefits and risks of the Services being provided via telemedicine.  I have been provided ample opportunity to ask questions regarding this consent and the Services and have had my questions answered to my satisfaction. I give my informed consent for the services to be provided through the use of telemedicine in my medical care

## 2024-06-09 ENCOUNTER — Ambulatory Visit (INDEPENDENT_AMBULATORY_CARE_PROVIDER_SITE_OTHER): Admitting: Otolaryngology

## 2024-06-16 DIAGNOSIS — M810 Age-related osteoporosis without current pathological fracture: Secondary | ICD-10-CM | POA: Diagnosis not present

## 2024-06-29 ENCOUNTER — Telehealth: Payer: Self-pay | Admitting: *Deleted

## 2024-06-29 MED ORDER — TRELEGY ELLIPTA 100-62.5-25 MCG/ACT IN AEPB: 1.0000 | INHALATION_SPRAY | Freq: Every day | RESPIRATORY_TRACT | 0 refills | Status: AC

## 2024-06-29 NOTE — Telephone Encounter (Signed)
 Copied from CRM #[id number]. Topic: Clinical - Prescription Issue >> Jun 27, 2024 12:34 PM Rilla B wrote: Reason for CRM: Patient is calling for Trelegy samples.  Patient has about 3 days left and states she gets samples from the office. Please call patient @ [phone].    -----------------------------------------------------------------------   Has the patient been seen for an appointment in the last year OR does the patient have an upcoming appointment?    Can we respond through MyChart?    Agent: Please be advised that Rx refills may take up to 3 business days. We ask that you follow-up with your pharmacy.

## 2024-06-29 NOTE — Telephone Encounter (Signed)
 Spoke with the pt  She is in [place] until Jan 2026  Needing another sample of trelegy 100  I sample left up front  Nothing further needed

## 2024-07-03 NOTE — Telephone Encounter (Signed)
 Noted Copied from CRM [id number]. Topic: Clinical - Medication Question >> Jul 03, 2024  8:16 AM Joesph PARAS wrote: Reason for CRM: Patient is calling to state that because she is unsure about the weather, she will not be coming today to pick up the Trelegy sample 'held' for her. Patient states she was told the office only has one sample and she must come last Friday or today to get it (unknown who she spoke to). Patient is requesting office to continue 'holding' sample for her until she feels it is safe to travel to get it.   Attempted to confirm via CAL, no answer x3.

## 2024-07-07 ENCOUNTER — Ambulatory Visit: Attending: Family Medicine

## 2024-07-07 DIAGNOSIS — Z01810 Encounter for preprocedural cardiovascular examination: Secondary | ICD-10-CM | POA: Diagnosis not present

## 2024-07-07 NOTE — Telephone Encounter (Signed)
 Pt rescheduled for VV on 12/12

## 2024-07-07 NOTE — Progress Notes (Signed)
 Virtual Visit via Telephone Note   Because of Renee Navarro co-morbid illnesses, she is at least at moderate risk for complications without adequate follow up.  This format is felt to be most appropriate for this patient at this time.  Due to technical limitations with video connection (technology), today's appointment will be conducted as an audio only telehealth visit, and Renee Navarro verbally agreed to proceed in this manner.   All issues noted in this document were discussed and addressed.  No physical exam could be performed with this format.  Evaluation Performed:  Preoperative cardiovascular risk assessment _____________   Date:  07/07/2024   Patient ID:  Renee Navarro, DOB 1946-02-21, MRN [id number] Patient Location:  Home Provider location:   Office  Primary Care Provider:  [name], MD Primary Cardiologist:  Shelda Bruckner, MD  Chief Complaint / Patient Profile   78 y.o. y/o female with a h/o palpitations, coronary artery disease, hypercholesterolemia, who is pending colonoscopy on  and presents today for telephonic preoperative cardiovascular risk assessment.  History of Present Illness    Renee Navarro is a 78 y.o. female who presents via audio/video conferencing for a telehealth visit today.  Pt was last seen in cardiology clinic on  by 09/17/2023 by Dr. Bruckner.  At that time Renee Navarro was doing well was without complaints of chest pain, palpitations were rare, and no complaints of shortness of breath.  The patient is now pending procedure as outlined above. Since her last visit, she   Past Medical History    Past Medical History:  Diagnosis Date   Abnormal EKG 09/20/2018   Chronic bronchitis (HCC) 10/03/2014   COPD (chronic obstructive pulmonary disease) (HCC)    Dyslipidemia    diet controlled   Dyspnea    with exertion   History of radiation therapy 05/28/2020-06/04/2020   3 SBRT treatments to left lung; Dr Lynwood Nasuti   Lung nodule  02/29/2012   3/24/2014CT Chest: no change in RUL lung nodule, likely benign. No change since 2008  No further CTs needed    Lung nodule seen on imaging study    Obstructive chronic bronchitis without exacerbation gold stage A COPD 11/17/2007   Osteoporosis    Pneumonia    x 1   Pneumonia    x 1   Primary cancer of left upper lobe of lung (HCC) 05/09/2020   RBBB (right bundle branch block with left anterior fascicular block) 09/22/2018   Tobacco user    quit 06/2012   Past Surgical History:  Procedure Laterality Date   BRONCHIAL BIOPSY  04/30/2020   Procedure: BRONCHIAL BIOPSIES;  Surgeon: [name], DO;  Location: [facility];  Service: Pulmonary;;   BRONCHIAL BRUSHINGS  04/30/2020   Procedure: BRONCHIAL BRUSHINGS;  Surgeon: [name], DO;  Location: [facility];  Service: Pulmonary;;   BRONCHIAL NEEDLE ASPIRATION BIOPSY  04/30/2020   Procedure: BRONCHIAL NEEDLE ASPIRATION BIOPSIES;  Surgeon: [name], DO;  Location: [facility];  Service: Pulmonary;;   BRONCHIAL WASHINGS  04/30/2020   Procedure: BRONCHIAL WASHINGS;  Surgeon: [name], DO;  Location: [facility];  Service: Pulmonary;;   colonoscopy     FIDUCIAL MARKER PLACEMENT  04/30/2020   Procedure: FIDUCIAL MARKER PLACEMENT;  Surgeon: [name], DO;  Location: [facility];  Service: Pulmonary;;   TONSILLECTOMY     TUBAL LIGATION     VIDEO BRONCHOSCOPY WITH ENDOBRONCHIAL NAVIGATION N/A 04/30/2020   Procedure: VIDEO BRONCHOSCOPY WITH ENDOBRONCHIAL  NAVIGATION;  Surgeon: [name], DO;  Location: [facility];  Service: Pulmonary;  Laterality: N/A;   WISDOM TOOTH EXTRACTION     WRIST SURGERY Left     Allergies  Allergies[1]  Home Medications    Prior to Admission medications  Medication Sig Start Date End Date Taking? Authorizing Provider  albuterol  (PROAIR  HFA) 108 (90 Base) MCG/ACT inhaler Inhale 2 puffs into the lungs every 6 (six) hours as needed for shortness of breath.  10/23/21   [name], DO  amoxicillin -clavulanate (AUGMENTIN ) 875-125 MG tablet Take 1 tablet by mouth every 12 (twelve) hours. Patient not taking: Reported on 06/02/2024 10/23/23   Towana Ozell BROCKS, MD  calcium  carbonate (SUPER CALCIUM ) 1500 (600 Ca) MG TABS tablet Take 600 mg of elemental calcium  by mouth daily with breakfast. 11/24/22   [provider]  Calcium  Citrate-Vitamin D  315-5 MG-MCG TABS Take 5 mcg by mouth 2 (two) times daily.    [provider]  CALCIUM  PO Take 650 mg by mouth daily.    [provider]  denosumab  (PROLIA ) 60 MG/ML SOSY injection Inject 60 mg into the skin every 6 (six) months.    [provider]  ezetimibe  (ZETIA ) 10 MG tablet Take 1 tablet (10 mg total) by mouth daily. 09/17/23   Lonni Slain, MD  Fluticasone -Umeclidin-Vilant (TRELEGY ELLIPTA ) 100-62.5-25 MCG/ACT AEPB Inhale 1 each into the lungs daily. 08/07/23   [name], DO  Fluticasone -Umeclidin-Vilant (TRELEGY ELLIPTA ) 100-62.5-25 MCG/ACT AEPB Inhale 1 puff into the lungs daily. Patient not taking: Reported on 06/02/2024 01/14/24   Shelah Lamar RAMAN, MD  Fluticasone -Umeclidin-Vilant (TRELEGY ELLIPTA ) 100-62.5-25 MCG/ACT AEPB Inhale 1 puff into the lungs daily. Patient not taking: Reported on 06/02/2024 04/07/24   Byrum, Robert S, MD  Fluticasone -Umeclidin-Vilant (TRELEGY ELLIPTA ) 100-62.5-25 MCG/ACT AEPB 2 samples 05/24/24   Shelah Lamar RAMAN, MD  MAGNESIUM PO Take 200 mg by mouth 2 (two) times daily.    [provider]  TRELEGY ELLIPTA  100-62.5-25 MCG/ACT AEPB Inhale 1 puff into the lungs daily. 2 samples 06/29/24   Shelah Lamar RAMAN, MD  VITAMIN D  PO Take 2,000 Int'l Units/day by mouth daily. Takes 4 tabs daily.    [provider]    Physical Exam    Vital Signs:  Renee Navarro does not have vital signs available for review today.  Given telephonic nature of communication, physical exam is limited. AAOx3. NAD. Normal affect.  Speech and  respirations are unlabored.  Accessory Clinical Findings    None  Assessment & Plan    1.  Preoperative Cardiovascular Risk Assessment: According to the Revised Cardiac Risk Index (RCRI), her Perioperative Risk of Major Cardiac Event is (%): 0.4  Her Functional Capacity in METs is: 8.97 according to the Duke Activity Status Index (DASI).   Therefore, based on ACC/AHA guidelines, patient would be at acceptable risk for the planned procedure without further cardiovascular testing. I will route this recommendation to the requesting party via Epic fax function.   The patient was advised that if she develops new symptoms prior to surgery to contact our office to arrange for a follow-up visit, and she Patient is not on anticoagulation or antiplatelet per review of current medical record in Epic.    A copy of this note will be routed to requesting surgeon.  Time:   Today, I have spent 10 minutes with the patient with telehealth technology discussing medical history, symptoms, and management plan.     Lamarr Satterfield, NP  07/07/2024, 2:47  PM     [1]  Allergies Allergen Reactions   Budesonide-Formoterol Fumarate Cough   Statins Other (See Comments)    Trouble urinating

## 2024-07-07 NOTE — Telephone Encounter (Signed)
 PT has another appt for 12/19 and needs to reschedule tele visit. Please advise.

## 2024-07-11 ENCOUNTER — Other Ambulatory Visit: Payer: Self-pay | Admitting: Obstetrics and Gynecology

## 2024-07-11 DIAGNOSIS — R92333 Mammographic heterogeneous density, bilateral breasts: Secondary | ICD-10-CM

## 2024-07-14 ENCOUNTER — Telehealth: Payer: Self-pay

## 2024-07-14 ENCOUNTER — Ambulatory Visit

## 2024-07-14 NOTE — Telephone Encounter (Unsigned)
 Copied from CRM #[id number]. Topic: Clinical - Medication Question >> Jul 14, 2024  1:39 PM Ismael A wrote: Reason for CRM: pt called stating she was provided with 1 sample of Trelegy, she only has a few days left of it and is interested in one more sample. She states she will order some more by the beginning of January

## 2024-07-16 ENCOUNTER — Ambulatory Visit

## 2024-07-16 DIAGNOSIS — R92333 Mammographic heterogeneous density, bilateral breasts: Secondary | ICD-10-CM

## 2024-07-16 MED ORDER — GADOBUTROL 1 MMOL/ML IV SOLN
5.0000 mL | Freq: Once | INTRAVENOUS | Status: AC | PRN
  Administered 2024-07-16: 5 mL via INTRAVENOUS

## 2024-07-17 NOTE — Telephone Encounter (Signed)
 Called and spoke with the pt and advised we do not have any Trelegy samples. Pt advised she has enough to last her till the new year. Nothing further needed.

## 2024-08-04 ENCOUNTER — Ambulatory Visit (HOSPITAL_COMMUNITY)
Admission: RE | Admit: 2024-08-04 | Discharge: 2024-08-04 | Disposition: A | Source: Ambulatory Visit | Attending: Radiology

## 2024-08-04 DIAGNOSIS — C3412 Malignant neoplasm of upper lobe, left bronchus or lung: Secondary | ICD-10-CM | POA: Insufficient documentation

## 2024-08-06 NOTE — Progress Notes (Signed)
 "  Radiation Oncology         [phone] ________________________________  Name: Renee Navarro MRN: [id number]  Date: 08/07/2024  DOB: Apr 02, 1946  Follow-Up Visit Note  CC: Renee Millman, MD  Renee Adine CROME, DO  No diagnosis found.  Diagnosis: Squamous cell carcinoma of the left upper lobe, Clinical Stage IA1   Interval Since Last Radiation: 4 years, 2 months, and 3 days   Radiation Treatment Dates: 05/28/2020 through 06/04/2020 Site Technique Total Dose (Gy) Dose per Fx (Gy) Completed Fx Beam Energies  Lung, Left: Lung_Lt SBRT 54/54 18 3/3 6XFFF    Narrative:  The patient returns today for routine follow-up and to review recent imaging. She was last seen here for follow-up on 05/04/24.   Since her last visit, she has continued to follow with Dr. Shelah for management of COPD. She was noted to be doing well during her most recent visit with Dr. Shelah on 05/24/24. She denied any recent exacerbations but did endorse frequent URIs via work exposure. Dr. Shelah would like her to continue with Trelegy once daily. She was also advised to continue using her albuterol  inhaler as needed.   Her most recent chest CT on 08/04/24 demonstrates stability of the presumably radiation induced left apical consolidation and no evidence of metastatic disease. Resolution of lingular PNA was also demonstrated, however there are new multifocal peripheral predominant airspace opacities present that are most consistent with interval infection. Other findings of potential clinical significance include an incompletely imaged sclerotic lesion within the proximal left humerus which has been present since at least 2021 (this did not appear to be tracer avid on PET imaging from 04/24/20).   Other imaging performed in the interval includes:   -- Bilateral screening mammogram on 06/02/24 that showed no evidence of malignancy in either breast.   -- Bilateral breast MRI on 07/16/24 that also showed no evidence of  malignancy in either breast.   ***                               Allergies:  is allergic to budesonide-formoterol fumarate and statins.  Meds: Current Outpatient Medications  Medication Sig Dispense Refill   albuterol  (PROAIR  HFA) 108 (90 Base) MCG/ACT inhaler Inhale 2 puffs into the lungs every 6 (six) hours as needed for shortness of breath. 8 g 3   amoxicillin -clavulanate (AUGMENTIN ) 875-125 MG tablet Take 1 tablet by mouth every 12 (twelve) hours. (Patient not taking: Reported on 06/02/2024) 14 tablet 0   calcium  carbonate (SUPER CALCIUM ) 1500 (600 Ca) MG TABS tablet Take 600 mg of elemental calcium  by mouth daily with breakfast.     Calcium  Citrate-Vitamin D  315-5 MG-MCG TABS Take 5 mcg by mouth 2 (two) times daily.     CALCIUM  PO Take 650 mg by mouth daily.     denosumab  (PROLIA ) 60 MG/ML SOSY injection Inject 60 mg into the skin every 6 (six) months.     ezetimibe  (ZETIA ) 10 MG tablet Take 1 tablet (10 mg total) by mouth daily. 90 tablet 3   Fluticasone -Umeclidin-Vilant (TRELEGY ELLIPTA ) 100-62.5-25 MCG/ACT AEPB Inhale 1 each into the lungs daily. 180 each 3   Fluticasone -Umeclidin-Vilant (TRELEGY ELLIPTA ) 100-62.5-25 MCG/ACT AEPB Inhale 1 puff into the lungs daily. (Patient not taking: Reported on 06/02/2024)     Fluticasone -Umeclidin-Vilant (TRELEGY ELLIPTA ) 100-62.5-25 MCG/ACT AEPB Inhale 1 puff into the lungs daily. (Patient not taking: Reported on 06/02/2024)     Fluticasone -Umeclidin-Vilant (TRELEGY  ELLIPTA) 100-62.5-25 MCG/ACT AEPB 2 samples     MAGNESIUM PO Take 200 mg by mouth 2 (two) times daily.     TRELEGY ELLIPTA  100-62.5-25 MCG/ACT AEPB Inhale 1 puff into the lungs daily. 2 samples 14 each 0   VITAMIN D  PO Take 2,000 Int'l Units/day by mouth daily. Takes 4 tabs daily.     No current facility-administered medications for this encounter.    Physical Findings: The patient is in no acute distress. Patient is alert and oriented.  vitals were not taken for this visit. .  No  significant changes. Lungs are clear to auscultation bilaterally. Heart has regular rate and rhythm. No palpable cervical, supraclavicular, or axillary adenopathy. Abdomen soft, non-tender, normal bowel sounds.   Lab Findings: Lab Results  Component Value Date   WBC 22.8 (H) 10/23/2023   HGB 13.4 10/23/2023   HCT 39.9 10/23/2023   MCV 91.3 10/23/2023   PLT 293 10/23/2023    Radiographic Findings: CT CHEST WO CONTRAST Result Date: 08/04/2024 EXAM: CT CHEST WITHOUT CONTRAST 08/04/2024 10:20:54 AM TECHNIQUE: CT of the chest was performed without the administration of intravenous contrast. Multiplanar reformatted images are provided for review. Automated exposure control, iterative reconstruction, and/or weight based adjustment of the mA/kV was utilized to reduce the radiation dose to as low as reasonably achievable. COMPARISON: 05/01/2024 CLINICAL HISTORY: Non small cell lung cancer. Follow up of pneumonia. Radiation therapy 4 years ago. Weight loss. * Tracking Code: BO * FINDINGS: MEDIASTINUM: No mediastinal or hilar adenopathy. Heart size accentuated by a pectus excavatum deformity. Multivessel coronary artery calcification. Aortic atherosclerosis. The central airways are clear. Hilar regions poorly evaluated without IV contrast. CARDIOVASCULAR: Normal heart size, without pericardial effusion. Normal aortic caliber. LYMPH NODES: No mediastinal or axillary lymphadenopathy. LUNGS AND PLEURA: Moderate centrilobular emphysema. Right apical pleural parenchymal scarring. Left upper lobe consolidation and architectural distortion surrounding fiducials, similar and likely radiation induced. Lingular process has resolved, likely infectious. New areas of subpleural mild consolidation in the posterior right upper lobe on 38/6, anterior inferior right upper lobe on 79/6, left lower lobe on 128/6. Concurrent clustered 'tree in bud' nodularity within the right lower lobe, including on image 115/6. No pneumothorax.  SOFT TISSUES/BONES: Lesion within the proximal left humerus is incompletely imaged but has been present back to at least 04/10/2020. Minimal right convex thoracic spine curvature. Pectus excavatum deformity. No acute abnormality of the soft tissues. UPPER ABDOMEN: Limited images of the upper abdomen demonstrates no acute abnormality. IMPRESSION: 1. Although the lingular pneumonia has resolved, there are new multifocal peripheral predominant airspace opacities which are most consistent with interval infection. 2. Similar left apical presumably radiation induced consolidation.no findings of metastatic disease. 3. Sclerotic lesion within the proximal left humerus is incompletely imaged but has been present back to at least 2021 and not tracer avid on 04/24/20 PET. Likely an enchondroma. 4. Incidental findings, including Aortic atherosclerosis (icd10-i70.0), coronary artery atherosclerosis, and emphysema (icd10-j43.9). Electronically signed by: Rockey Kilts MD MD 08/04/2024 04:12 PM EST RP Workstation: [id number]   MR BREAST W & WO CM SCREENING (GI) Result Date: 07/16/2024 CLINICAL DATA:  Abbreviated Breast MRI for breast cancer screening. EXAM: BILATERAL ABBREVIATED BREAST MRI WITH AND WITHOUT CONTRAST TECHNIQUE: Multiplanar, multisequence MR images of both breasts were obtained prior to and following the intravenous administration of 5 ml of Vueway Three-dimensional MR images were rendered by post-processing of the original MR data on an independent workstation. The three-dimensional MR images were interpreted, and findings are reported in the following complete  MRI report for this study. Three dimensional images were evaluated at the independent DynaCad workstation COMPARISON:  Previous exam(s). FINDINGS: Breast composition: d. Extreme fibroglandular tissue. Background parenchymal enhancement: Mild Right breast: No mass or abnormal enhancement. Left breast: No mass or abnormal enhancement. Lymph nodes: No abnormal  appearing lymph nodes. Ancillary findings:  None. IMPRESSION: No MRI evidence of malignancy in either breast. RECOMMENDATION: Recommend annual screening mammography. The patient is also eligible for annual Abbreviated Breast MRI if she wishes. BI-RADS CATEGORY  1: Negative. Electronically Signed   By: Dina  Arceo M.D.   On: 07/16/2024 12:11    Impression: Squamous cell carcinoma of the left upper lobe, Clinical Stage IA1   The patient is recovering from the effects of radiation.  ***  Plan:  ***   *** minutes of total time was spent for this patient encounter, including preparation, face-to-face counseling with the patient and coordination of care, physical exam, and documentation of the encounter. ____________________________________  Lynwood CHARM Nasuti, PhD, MD  This document serves as a record of services personally performed by Lynwood Nasuti, MD. It was created on his behalf by Dorthy Fuse, a trained medical scribe. The creation of this record is based on the scribe's personal observations and the provider's statements to them. This document has been checked and approved by the attending provider.  "

## 2024-08-07 ENCOUNTER — Encounter: Payer: Self-pay | Admitting: Radiation Oncology

## 2024-08-07 ENCOUNTER — Ambulatory Visit
Admission: RE | Admit: 2024-08-07 | Discharge: 2024-08-07 | Disposition: A | Discharge status: Home / Self Care | Source: Ambulatory Visit | Attending: Radiation Oncology | Admitting: Radiation Oncology

## 2024-08-07 VITALS — BP 129/76 | HR 76 | Temp 97.4°F | Resp 18 | Ht 64.0 in | Wt 107.6 lb

## 2024-08-07 DIAGNOSIS — C3412 Malignant neoplasm of upper lobe, left bronchus or lung: Secondary | ICD-10-CM

## 2024-08-07 NOTE — Progress Notes (Signed)
 Renee Navarro is here today for follow up post radiation to the lung.  Lung Side:  Left, patient completed treatment on 06/04/20  Does the patient complain of any of the following: Pain: No Shortness of breath w/wo exertion: Yes, reports recently getting over a respiratory illness.  Cough: Yes, productive  Hemoptysis: No Pain with swallowing: No Swallowing/choking concerns: No Appetite: Good  Energy Level: Good  Post radiation skin Changes: No     Additional comments if applicable: Patient to have colonoscopy Friday.    BP 129/76 (BP Location: Left Arm, Patient Position: Sitting)   Pulse 76   Temp (!) 97.4 F (36.3 C)   Resp 18   Ht 5' 4 (1.626 m)   Wt 107 lb 9.6 oz (48.8 kg)   LMP  (LMP Unknown)   SpO2 99%   BMI 18.47 kg/m

## 2024-08-10 ENCOUNTER — Ambulatory Visit: Payer: Self-pay | Admitting: Radiation Oncology

## 2024-08-11 LAB — HM COLONOSCOPY

## 2024-09-15 ENCOUNTER — Ambulatory Visit (HOSPITAL_BASED_OUTPATIENT_CLINIC_OR_DEPARTMENT_OTHER): Admitting: Cardiology

## 2024-11-10 ENCOUNTER — Ambulatory Visit: Admitting: Emergency Medicine

## 2024-11-14 ENCOUNTER — Ambulatory Visit: Admitting: Radiology
